# Patient Record
Sex: Male | Born: 1955 | Race: Black or African American | Hispanic: No | Smoking: Current every day smoker
Health system: Southern US, Community
[De-identification: ages and names within clinical notes are randomized; demographics above are authoritative.]

## PROBLEM LIST (undated history)

## (undated) DIAGNOSIS — I639 Cerebral infarction, unspecified: Secondary | ICD-10-CM

## (undated) DIAGNOSIS — I10 Essential (primary) hypertension: Secondary | ICD-10-CM

## (undated) DIAGNOSIS — E785 Hyperlipidemia, unspecified: Secondary | ICD-10-CM

## (undated) DIAGNOSIS — F32A Depression, unspecified: Secondary | ICD-10-CM

## (undated) DIAGNOSIS — I6529 Occlusion and stenosis of unspecified carotid artery: Secondary | ICD-10-CM

## (undated) DIAGNOSIS — F419 Anxiety disorder, unspecified: Secondary | ICD-10-CM

## (undated) HISTORY — PX: ANKLE SURGERY: SHX546

## (undated) HISTORY — DX: Hyperlipidemia, unspecified: E78.5

## (undated) HISTORY — DX: Occlusion and stenosis of unspecified carotid artery: I65.29

## (undated) HISTORY — DX: Essential (primary) hypertension: I10

---

## 2020-01-31 ENCOUNTER — Observation Stay (HOSPITAL_COMMUNITY): Payer: Medicare Other

## 2020-01-31 ENCOUNTER — Other Ambulatory Visit: Payer: Self-pay

## 2020-01-31 ENCOUNTER — Emergency Department (HOSPITAL_COMMUNITY): Payer: Medicare Other

## 2020-01-31 ENCOUNTER — Inpatient Hospital Stay (HOSPITAL_COMMUNITY)
Admission: EM | Admit: 2020-01-31 | Discharge: 2020-02-03 | DRG: 065 | Disposition: A | Discharge status: Skilled Nursing Facility | Payer: Medicare Other | Attending: Family Medicine | Admitting: Family Medicine

## 2020-01-31 DIAGNOSIS — F1010 Alcohol abuse, uncomplicated: Secondary | ICD-10-CM | POA: Diagnosis present

## 2020-01-31 DIAGNOSIS — Z9119 Patient's noncompliance with other medical treatment and regimen: Secondary | ICD-10-CM

## 2020-01-31 DIAGNOSIS — Z9689 Presence of other specified functional implants: Secondary | ICD-10-CM

## 2020-01-31 DIAGNOSIS — F17210 Nicotine dependence, cigarettes, uncomplicated: Secondary | ICD-10-CM | POA: Diagnosis present

## 2020-01-31 DIAGNOSIS — I69351 Hemiplegia and hemiparesis following cerebral infarction affecting right dominant side: Secondary | ICD-10-CM

## 2020-01-31 DIAGNOSIS — I10 Essential (primary) hypertension: Secondary | ICD-10-CM | POA: Diagnosis not present

## 2020-01-31 DIAGNOSIS — E785 Hyperlipidemia, unspecified: Secondary | ICD-10-CM | POA: Diagnosis present

## 2020-01-31 DIAGNOSIS — Z720 Tobacco use: Secondary | ICD-10-CM

## 2020-01-31 DIAGNOSIS — Z7982 Long term (current) use of aspirin: Secondary | ICD-10-CM

## 2020-01-31 DIAGNOSIS — Z79899 Other long term (current) drug therapy: Secondary | ICD-10-CM

## 2020-01-31 DIAGNOSIS — N182 Chronic kidney disease, stage 2 (mild): Secondary | ICD-10-CM | POA: Diagnosis present

## 2020-01-31 DIAGNOSIS — Z20822 Contact with and (suspected) exposure to covid-19: Secondary | ICD-10-CM | POA: Diagnosis present

## 2020-01-31 DIAGNOSIS — I129 Hypertensive chronic kidney disease with stage 1 through stage 4 chronic kidney disease, or unspecified chronic kidney disease: Secondary | ICD-10-CM | POA: Diagnosis present

## 2020-01-31 DIAGNOSIS — I639 Cerebral infarction, unspecified: Secondary | ICD-10-CM | POA: Diagnosis present

## 2020-01-31 DIAGNOSIS — R531 Weakness: Secondary | ICD-10-CM | POA: Diagnosis present

## 2020-01-31 DIAGNOSIS — Z833 Family history of diabetes mellitus: Secondary | ICD-10-CM

## 2020-01-31 DIAGNOSIS — I6523 Occlusion and stenosis of bilateral carotid arteries: Secondary | ICD-10-CM | POA: Diagnosis present

## 2020-01-31 DIAGNOSIS — R7989 Other specified abnormal findings of blood chemistry: Secondary | ICD-10-CM | POA: Diagnosis present

## 2020-01-31 DIAGNOSIS — Z7902 Long term (current) use of antithrombotics/antiplatelets: Secondary | ICD-10-CM

## 2020-01-31 DIAGNOSIS — R001 Bradycardia, unspecified: Secondary | ICD-10-CM | POA: Diagnosis present

## 2020-01-31 DIAGNOSIS — Z823 Family history of stroke: Secondary | ICD-10-CM

## 2020-01-31 DIAGNOSIS — F1290 Cannabis use, unspecified, uncomplicated: Secondary | ICD-10-CM | POA: Diagnosis present

## 2020-01-31 DIAGNOSIS — Z8249 Family history of ischemic heart disease and other diseases of the circulatory system: Secondary | ICD-10-CM

## 2020-01-31 DIAGNOSIS — R4189 Other symptoms and signs involving cognitive functions and awareness: Secondary | ICD-10-CM | POA: Diagnosis present

## 2020-01-31 DIAGNOSIS — I6381 Other cerebral infarction due to occlusion or stenosis of small artery: Principal | ICD-10-CM | POA: Diagnosis present

## 2020-01-31 DIAGNOSIS — I7411 Embolism and thrombosis of thoracic aorta: Secondary | ICD-10-CM | POA: Diagnosis present

## 2020-01-31 DIAGNOSIS — R471 Dysarthria and anarthria: Secondary | ICD-10-CM | POA: Diagnosis present

## 2020-01-31 DIAGNOSIS — R7303 Prediabetes: Secondary | ICD-10-CM | POA: Diagnosis present

## 2020-01-31 LAB — COMPREHENSIVE METABOLIC PANEL
ALT: 26 U/L (ref 0–44)
AST: 19 U/L (ref 15–41)
Albumin: 4.1 g/dL (ref 3.5–5.0)
Alkaline Phosphatase: 61 U/L (ref 38–126)
Anion gap: 13 (ref 5–15)
BUN: 14 mg/dL (ref 8–23)
CO2: 25 mmol/L (ref 22–32)
Calcium: 9.8 mg/dL (ref 8.9–10.3)
Chloride: 102 mmol/L (ref 98–111)
Creatinine, Ser: 1.44 mg/dL — ABNORMAL HIGH (ref 0.61–1.24)
GFR, Estimated: 54 mL/min — ABNORMAL LOW (ref >60)
Glucose, Bld: 97 mg/dL (ref 70–99)
Potassium: 3.7 mmol/L (ref 3.5–5.1)
Sodium: 140 mmol/L (ref 135–145)
Total Bilirubin: 0.7 mg/dL (ref 0.3–1.2)
Total Protein: 8.3 g/dL — ABNORMAL HIGH (ref 6.5–8.1)

## 2020-01-31 LAB — I-STAT CHEM 8, ED
BUN: 20 mg/dL (ref 8–23)
Calcium, Ion: 1.18 mmol/L (ref 1.15–1.40)
Chloride: 103 mmol/L (ref 98–111)
Creatinine, Ser: 1.4 mg/dL — ABNORMAL HIGH (ref 0.61–1.24)
Glucose, Bld: 96 mg/dL (ref 70–99)
HCT: 50 % (ref 39.0–52.0)
Hemoglobin: 17 g/dL (ref 13.0–17.0)
Potassium: 4.1 mmol/L (ref 3.5–5.1)
Sodium: 140 mmol/L (ref 135–145)
TCO2: 27 mmol/L (ref 22–32)

## 2020-01-31 LAB — CBC
HCT: 50.1 % (ref 39.0–52.0)
Hemoglobin: 16.1 g/dL (ref 13.0–17.0)
MCH: 30.6 pg (ref 26.0–34.0)
MCHC: 32.1 g/dL (ref 30.0–36.0)
MCV: 95.2 fL (ref 80.0–100.0)
Platelets: 345 10*3/uL (ref 150–400)
RBC: 5.26 MIL/uL (ref 4.22–5.81)
RDW: 13.5 % (ref 11.5–15.5)
WBC: 9.4 10*3/uL (ref 4.0–10.5)
nRBC: 0 % (ref 0.0–0.2)

## 2020-01-31 LAB — URINALYSIS, ROUTINE W REFLEX MICROSCOPIC
Bacteria, UA: NONE SEEN
Bilirubin Urine: NEGATIVE
Glucose, UA: NEGATIVE mg/dL
Ketones, ur: NEGATIVE mg/dL
Nitrite: NEGATIVE
Protein, ur: NEGATIVE mg/dL
Specific Gravity, Urine: 1.02 (ref 1.005–1.030)
pH: 5 (ref 5.0–8.0)

## 2020-01-31 LAB — RAPID URINE DRUG SCREEN, HOSP PERFORMED
Amphetamines: NOT DETECTED
Barbiturates: NOT DETECTED
Benzodiazepines: NOT DETECTED
Cocaine: NOT DETECTED
Opiates: NOT DETECTED
Tetrahydrocannabinol: POSITIVE — AB

## 2020-01-31 LAB — HIV ANTIBODY (ROUTINE TESTING W REFLEX): HIV Screen 4th Generation wRfx: NONREACTIVE

## 2020-01-31 LAB — DIFFERENTIAL
Abs Immature Granulocytes: 0.03 10*3/uL (ref 0.00–0.07)
Basophils Absolute: 0 10*3/uL (ref 0.0–0.1)
Basophils Relative: 0 %
Eosinophils Absolute: 0.1 10*3/uL (ref 0.0–0.5)
Eosinophils Relative: 2 %
Immature Granulocytes: 0 %
Lymphocytes Relative: 45 %
Lymphs Abs: 4.2 10*3/uL — ABNORMAL HIGH (ref 0.7–4.0)
Monocytes Absolute: 0.7 10*3/uL (ref 0.1–1.0)
Monocytes Relative: 7 %
Neutro Abs: 4.3 10*3/uL (ref 1.7–7.7)
Neutrophils Relative %: 46 %

## 2020-01-31 LAB — RESP PANEL BY RT-PCR (FLU A&B, COVID) ARPGX2
Influenza A by PCR: NEGATIVE
Influenza B by PCR: NEGATIVE
SARS Coronavirus 2 by RT PCR: NEGATIVE

## 2020-01-31 LAB — APTT: aPTT: 33 seconds (ref 24–36)

## 2020-01-31 LAB — CBG MONITORING, ED: Glucose-Capillary: 109 mg/dL — ABNORMAL HIGH (ref 70–99)

## 2020-01-31 LAB — PROTIME-INR
INR: 1.1 (ref 0.8–1.2)
Prothrombin Time: 13.3 seconds (ref 11.4–15.2)

## 2020-01-31 LAB — TROPONIN I (HIGH SENSITIVITY): Troponin I (High Sensitivity): 17 ng/L (ref <18)

## 2020-01-31 LAB — ETHANOL: Alcohol, Ethyl (B): <10 mg/dL (ref <10)

## 2020-01-31 MED ORDER — ASPIRIN 81 MG PO CHEW: 81.0000 mg | CHEWABLE_TABLET | Freq: Every day | ORAL | Status: DC

## 2020-01-31 MED ORDER — IOHEXOL 350 MG/ML SOLN
80.0000 mL | Freq: Once | INTRAVENOUS | Status: AC | PRN
  Administered 2020-01-31: 80 mL via INTRAVENOUS

## 2020-01-31 MED ORDER — ACETAMINOPHEN 160 MG/5ML PO SOLN: 650.0000 mg | ORAL | Status: DC | PRN

## 2020-01-31 MED ORDER — ATORVASTATIN CALCIUM 80 MG PO TABS
80.0000 mg | ORAL_TABLET | Freq: Every day | ORAL | Status: DC
  Administered 2020-01-31 – 2020-02-03 (×4): 80 mg via ORAL
  Filled 2020-01-31: qty 1
  Filled 2020-01-31: qty 8
  Filled 2020-01-31 (×2): qty 1

## 2020-01-31 MED ORDER — ENOXAPARIN SODIUM 40 MG/0.4ML ~~LOC~~ SOLN
40.0000 mg | SUBCUTANEOUS | Status: DC
  Administered 2020-01-31 – 2020-02-02 (×3): 40 mg via SUBCUTANEOUS
  Filled 2020-01-31 (×3): qty 0.4

## 2020-01-31 MED ORDER — ACETAMINOPHEN 650 MG RE SUPP: 650.0000 mg | RECTAL | Status: DC | PRN

## 2020-01-31 MED ORDER — STROKE: EARLY STAGES OF RECOVERY BOOK
Freq: Once | Status: AC
  Filled 2020-01-31 (×2): qty 1

## 2020-01-31 MED ORDER — ASPIRIN 81 MG PO CHEW
324.0000 mg | CHEWABLE_TABLET | Freq: Once | ORAL | Status: AC
  Administered 2020-01-31: 324 mg via ORAL
  Filled 2020-01-31: qty 4

## 2020-01-31 MED ORDER — ACETAMINOPHEN 325 MG PO TABS: 650.0000 mg | ORAL_TABLET | ORAL | Status: DC | PRN

## 2020-01-31 NOTE — ED Notes (Signed)
Reports given to fonya,RN 3W.

## 2020-01-31 NOTE — ED Notes (Signed)
Pt reports not having a drink in 20 years.

## 2020-01-31 NOTE — ED Provider Notes (Signed)
[facility] EMERGENCY DEPARTMENT Provider Note   CSN: [id number] Arrival date & time: 01/31/20  1018     History No chief complaint on file.   Alec Sanchez is a 64 y.o. male.  HPI   This patient is a 64 year old male, he has a reported history of a prior stroke which occurred a couple years ago, he was in an outside hospital system, he does not know if he was started on any new medications at that time.  He is a very difficult historian, did not bring any of his medications with him, the family member who dropped him off took his phone and is unable to be contacted.  The patient states that [facility] [place] and has been to a local family doctor here but does not know the name.  He is unsure if he is anticoagulated.  He is able to tell me that with his prior stroke he had changes in vision and slurred speech which seem to improve.  He notes that 3 days ago the slurred speech returned, it has worsened over the last few days.  He also notes having some difficulty with his right arm and right leg compared to normal.  He walks with a cane because of prior left leg weakness.  He denies any head injury, denies a headache, denies nausea or vomiting, denies coughing or shortness of breath, symptoms of been persistent and gradually worsening.  No past medical history on file.  There are no problems to display for this patient.   Hypertension Ischemic Stroke    No family history on file.  Social History   Tobacco Use  . Smoking status: Not on file  Substance Use Topics  . Alcohol use: Not on file  . Drug use: Not on file    Home Medications Prior to Admission medications   Not on File    Allergies    Patient has no allergy information on record.  Review of Systems   Review of Systems  All other systems reviewed and are negative.   Physical Exam Updated Vital Signs BP (!) 187/91   Pulse (!) 59   Temp 98.2 F  (36.8 C) (Oral)   Resp (!) 22   SpO2 100%   Physical Exam Vitals and nursing note reviewed.  Constitutional:      General: He is not in acute distress.    Appearance: He is well-developed.  HENT:     Head: Normocephalic and atraumatic.     Mouth/Throat:     Pharynx: No oropharyngeal exudate.  Eyes:     General: No scleral icterus.       Right eye: No discharge.        Left eye: No discharge.     Conjunctiva/sclera: Conjunctivae normal.     Pupils: Pupils are equal, round, and reactive to light.  Neck:     Thyroid: No thyromegaly.     Vascular: No JVD.  Cardiovascular:     Rate and Rhythm: Normal rate and regular rhythm.     Heart sounds: Normal heart sounds. No murmur heard.  No friction rub. No gallop.   Pulmonary:     Effort: Pulmonary effort is normal. No respiratory distress.     Breath sounds: Normal breath sounds. No wheezing or rales.  Abdominal:     General: Bowel sounds are normal. There is no distension.     Palpations: Abdomen is soft. There is no mass.  Tenderness: There is no abdominal tenderness.  Musculoskeletal:        General: No tenderness. Normal range of motion.     Cervical back: Normal range of motion and neck supple.  Lymphadenopathy:     Cervical: No cervical adenopathy.  Skin:    General: Skin is warm and dry.     Findings: No erythema or rash.  Neurological:     Mental Status: He is alert.     Coordination: Coordination normal.     Comments: The patient is slightly slow doing finger-nose-finger with his right hand, normal on the left.  He has a 4-1/2 out of 5 strength in the right upper extremity compared to the left, he has equal strength in the bilateral lower extremities, diffusely normal sensation, cranial nerves III through XII are normal, he does have a slowed slurred speech but is able to use the right words.  There is no changes in his gross visual acuity or his peripheral visual fields.  Psychiatric:        Behavior: Behavior  normal.     ED Results / Procedures / Treatments   Labs (all labs ordered are listed, but only abnormal results are displayed) Labs Reviewed  DIFFERENTIAL - Abnormal; Notable for the following components:      Result Value   Lymphs Abs 4.2 (*)    All other components within normal limits  COMPREHENSIVE METABOLIC PANEL - Abnormal; Notable for the following components:   Creatinine, Ser 1.44 (*)    Total Protein 8.3 (*)    GFR, Estimated 54 (*)    All other components within normal limits  RAPID URINE DRUG SCREEN, HOSP PERFORMED - Abnormal; Notable for the following components:   Tetrahydrocannabinol POSITIVE (*)    All other components within normal limits  URINALYSIS, ROUTINE W REFLEX MICROSCOPIC - Abnormal; Notable for the following components:   Hgb urine dipstick SMALL (*)    Leukocytes,Ua SMALL (*)    All other components within normal limits  I-STAT CHEM 8, ED - Abnormal; Notable for the following components:   Creatinine, Ser 1.40 (*)    All other components within normal limits  CBG MONITORING, ED - Abnormal; Notable for the following components:   Glucose-Capillary 109 (*)    All other components within normal limits  PROTIME-INR  APTT  CBC  ETHANOL    EKG EKG Interpretation  Date/Time:  Monday January 31 2020 10:31:37 EST Ventricular Rate:  57 PR Interval:  138 QRS Duration: 84 QT Interval:  434 QTC Calculation: 422 R Axis:   54 Text Interpretation: Sinus bradycardia Septal infarct , age undetermined Abnormal ECG No old tracing to compare Confirmed by Eber Hong ([id number]) on 01/31/2020 11:44:08 AM   Radiology CT HEAD WO CONTRAST  Addendum Date: 01/31/2020   ADDENDUM REPORT: 01/31/2020 11:25 ADDENDUM: These results were called by telephone at the time of interpretation on 01/31/2020 at 11:24 am to provider Dr. Hyacinth Meeker, Who verbally acknowledged these results. Electronically Signed   By: Donzetta Kohut M.D.   On: 01/31/2020 11:25   Result Date:  01/31/2020 CLINICAL DATA:  Difficulty walking with slurred speech for the past week. EXAM: CT HEAD WITHOUT CONTRAST TECHNIQUE: Contiguous axial images were obtained from the base of the skull through the vertex without intravenous contrast. COMPARISON:  None. FINDINGS: Brain: No evidence of, hemorrhage, hydrocephalus, extra-axial collection or mass lesion/mass effect. Signs of atrophy and chronic microvascular ischemic change. There is however an area of hypodensity in the LEFT corona radiata.  This area in the absence of priors is indeterminate. Vascular: No hyperdense vessel or unexpected calcification. Skull: Normal. Negative for fracture or focal lesion. Sinuses/Orbits: Visualized paranasal sinuses and orbits are normal. Other: Trace stranding in the occipital soft tissues, correlate with any history of trauma in this area. No hematoma. IMPRESSION: 1. Signs of atrophy and chronic microvascular ischemic change. There is however an area of hypodensity in the LEFT corona radiata out of which is indeterminate in the absence of priors. Acute or subacute infarct could potentially have this appearance. If there is high clinical concern MRI may be helpful for further assessment. 2. Trace stranding in the occipital soft tissues, correlate with any history of trauma in this area. No hematoma. Call is out to the referring provider to further discuss findings in the above case. Electronically Signed: By: Donzetta Kohut M.D. On: 01/31/2020 11:09    Procedures Procedures (including critical care time)  Medications Ordered in ED Medications  aspirin chewable tablet 324 mg (324 mg Oral Given 01/31/20 1204)    ED Course  I have reviewed the triage vital signs and the nursing notes.  Pertinent labs & imaging results that were available during my care of the patient were reviewed by me and considered in my medical decision making (see chart for details).  Clinical Course as of Jan 30 1422  Mon Jan 31, 2020  1307 I  discussed the case with Dr. Amada Jupiter of the hospitalist service who agrees that the patient needs to be admitted for a stroke work-up and he will see the patient in consultation   [BM]    Clinical Course User Index [BM] Eber Hong, MD   MDM Rules/Calculators/A&P                          The patient's metabolic panel shows a slightly elevated creatinine at 1.4, CBC is normal, CT scan of the head shows that there is likely a sign of some increased hypodensity in the left corona radiata, there is no old strokes to compare this to however given the patient's new slurred speech she will likely need to be admitted for stroke work-up.  I will consult with the neuro hospitalist.  I personally viewed the CT scan and I agree with the finding of hypodensity.  I have personally reviewed and interpreted the laboratory work-up and I see no other signs of causation for the patient have slurred speech.  Again he is unaware of the medications that he takes.  Given no bleeding on the CT scan I will start aspirin.  The CT scan shows an evolving infarct, lab work is rather unremarkable, creatinine is 1.4 and the drug screen was positive for marijuana.  I discussed the care with the family medicine resident who will admit the patient to the hospital as he is unassigned. Vitals:   01/31/20 1230 01/31/20 1245 01/31/20 1300 01/31/20 1315  BP: (!) 159/79 (!) 168/99 (!) 120/91 (!) 187/91  Pulse: (!) 54 (!) 54 (!) 51 (!) 59  Resp: 11 15 12  (!) 22  Temp:      TempSrc:      SpO2: 100% 98% 100% 100%     Final Clinical Impression(s) / ED Diagnoses Final diagnoses:  Acute ischemic stroke (HCC)  Primary hypertension    Rx / DC Orders ED Discharge Orders    None       , MD 01/31/20 1423

## 2020-01-31 NOTE — Progress Notes (Signed)
Pt admitted to the unit from ED. Pt A&O x4, VSS, telemetry applied and verified with CCMD, NT called to second verify. Fall/safety precaution and prevention education completed. Pt skin intact with no pressure ulcer or opened wounds noted per protocol. Pt in bed with call light within reach and bed alarm on. Will continue to closely monitor. Dionne Bucy RN  01/31/20 1941  Vitals  Temp 98.2 F (36.8 C)  Temp Source Oral  BP (!) 183/90  MAP (mmHg) 117  BP Location Right Arm  BP Method Automatic  Patient Position (if appropriate) Sitting  Pulse Rate (!) 57  Pulse Rate Source Monitor  Resp 16  MEWS COLOR  MEWS Score Color Green  Oxygen Therapy  SpO2 100 %  O2 Device Room Air  Pain Assessment  Pain Scale 0-10  Pain Score 0  Height and Weight  Height 5\' 7"  (1.702 m)  Weight 94.3 kg  BSA (Calculated - sq m) 2.11 sq meters  BMI (Calculated) 32.55  Weight in (lb) to have BMI = 25 159.3  MEWS Score  MEWS Temp 0  MEWS Systolic 0  MEWS Pulse 0  MEWS RR 0  MEWS LOC 0  MEWS Score 0

## 2020-01-31 NOTE — H&P (Addendum)
Family Medicine Teaching [facility] Admission History and Physical Service Pager: [phone]  Patient name: Alec Sanchez Medical record number: [id number] Date of birth: June 16, 1955 Age: 64 y.o. Gender: male  Primary Care Provider: [facility] Consultants: Neuro Code Status: DNI Preferred Emergency Contact: Hendrix Yurkovich, Daughter, [phone]  Chief Complaint: slurred speech and right arm weakness  Assessment and Plan: Alec Sanchez is a 64 y.o. male presenting with progressively worsening  Slurred speech and right sided weakness. PMH is significant for stroke with residual right sided weakness, HTN, HLD.   Ischemic stroke with right sided weakness Patient presented to the ED with 3 days of progressively worsening slurred speech and right sided weakness. Patient reports history of stroke with residual right sided weakness in March 2020, chronically uses a cane. Patient home medications include ASA 325mg  and Plavix 25mg  daily which the patient reports he has not been compliant with in the last 2-3 weeks. Unsure why patient would be on continued DAPT but likely due to non-compliance and lost to follow-up after prior stroke. CT showed signs of atrophy and chronic vascular ischemic change with an area of hypodensity in the left corona radiata out of which is indeterminate; acute or subacute infarct to potentially have this appearance-recommended MRI for further assessment. In the ED, patient was given ASA 324 and neurology was consulted. Physical exam significant for some slurring of speech and deviation of tongue to the right, weakness was not apparent on exam. Patient unable to have MRI at the moment due to spinal cord stimulator, we are unsure if it is MRI compatible, will await neuro recommendations for further imaging studies.   He will be admitted for further acute stroke w/u. - Admit to inpatient FPTS as observation, attending Dr. - Neurology consulted, appreciate recs -  Permissive HTN - Continuous cardiac monitoring - F/u echocardiogram - Patient will need CTA head and neck, will await neurology recs to order - Patient has spinal cord stimulator, defer MRI until able to confirm if MRI compatible - Currently holding home Plavix and ASA, awaiting neuro recs, s/p ASA 324mg  in ED - start high intensity statin, lipitor 80mg  (patient's home med, although noncompliant) - F/u A1C, lipid panel for risk stratification - PT/OT/SLP  Abnormal ECG Patient noted to have bradycardia with minimal ST elevations in anterior leads. Less likely ACS, but in setting of recent stroke, ruling out cardiac concerns with Troponin trend - F/u Troponins  Elevated creatinine: ?CKD vs AKI Patient's creatinine elevated to 1.44 on admission.  We are unsure of the patient's baseline, could possibly be an AKI but unable to determine at this time.  Patient has chronic hypertension and is noncompliant with medications. Possibly related to recent ischemic stroke but could be chronic with hypertension that does not appear to be well controlled. - Continue to monitor with BMPs   Hypertension Patient admitted with blood pressures ranging from 120-187/77-107. Home medications include lisinopril 10 mg daily, HCTZ 25 mg daily, Norvasc 10 daily.  Patient has been non-compliant with home medications in the last 2-3 weeks, suspect that he has been noncompliant for longer than that given number of pills in bottles at bedside and fill dates. Patient has an RX for Prazosin as well at bedside, unsure if HTN is the indication as patient is also unsure of medical history. Patient reports that his systolic pressures have been ranging from 120s-140s when he was taking his medications, family member in room thinks that the pressures have been higher than reported. Patient currently  denies HA. - Permissive HTN due to stroke - Continue to monitor - Holding home medications  Bradycardia Patient noted to have heart  rates in the 50s. Patient currently reporting some mild dizziness but otherwise asymptomatic and this likely is chronic in nature.  EKG sinus bradycardia, no AV block noted. - Continue to monitor - Continuous cardiac monitoring - Echo per above  Marijuana use UDS on admission positive for marijuana. During solo interview, patient reported that he has "not had marijuana since the summer". Social history screening is likely skewed due to patient under reporting.  Denies other drug use. 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 Patient denies alcohol use since 2007 but has had several inconsistencies when taking his history, CIWAs due to concern for alcohol use.  - Etoh level - CIWAs q6h, can consider discontinuing if not showing signs of withdrawal. - will add prn ativan if consistently elevated CIWAs  Tobacco Abuse Smokes 2-3 cigs a day.  Started smoking in June again, previously had quit around age 72.  Started smoking when he was 13. - Consider need for nicotine patch as unsure if patient was honest about his tobacco use. - encourage cessation  Concern for cognitive impairment Upon history taking and exam, patient appears to have some mild impairment with his memory. Patient did not mention his history of spinal surgery, could not remember his medications or several of his medical diagnoses. Will likely need outpatient follow-up for monitoring and evaluation.  - consider MOCA as outpatient, would not be as useful inpatient in setting of likely acute stroke   FEN/GI: Heart healthy diet Prophylaxis: Lovenox  Disposition: Admit to med-tele obs  History of Present Illness:  Alec Sanchez is a 64 y.o. male presenting with slurred speech and right sided weakness for the last 3 days that has progressively worsened.  Patient was brought to the ED and this morning by her son-in-law, who was concerned because the  patient was "walking funny and I was slowing my weight".  Patient states for the last 3 days he noticed that his right leg and arm were weaker but did not noticed that his speech was much change.  He does note that he has had difficulty with formulating his words and expressing them, but states that he is able to understand other people just fine.  Reports no changes in vision or difficulty with swallowing. Patient reported that he had a stroke in March 2020 treated at [facility] and that he has residual right leg weakness.  Patient does ambulate with a cane for the last 12 years after having broken his ankle when he was in the [employer] and had a subsequent surgery.  Patient states that he lives alone in an apartment in [place].  He has family in the area that he sees very frequently.   PMH: Patient denies history of cardiac problems including heart failure and heart attack.  Denies lung disease, denies kidney disease.   Patient has several medications that he takes, but he is unsure of what they are.  He reports not having taken his medications in the last 2 to 3 weeks because he is "hard headed". Patient unsure of why he is on certain medications, medical history limited due to this. Patient reports he has never been hospitalized other than his last    Medications (brought in by son-in-law) -Atorvastatin 80 mg -Plavix 100 mg -Aspirin 325 mg -Lisinopril  10 mg daily -HCTZ 25 mg daily -Norvasc 10 mg daily -Prazosin 1 mg daily -Ferrous sulfate 325 daily -Paroxetine 40 mg daily -Naproxen 5 mg as needed -Sildenafil 100 mg as needed -Cholecalciferol 25 mcg daily   Review Of Systems: Per HPI with the following additions:   Review of Systems  Constitutional: Negative for fever.  HENT: Negative for congestion, sore throat and trouble swallowing.   Eyes: Negative for visual disturbance.  Respiratory: Negative for cough and shortness of breath.   Cardiovascular: Negative  for chest pain and palpitations.  Gastrointestinal: Negative for abdominal pain, diarrhea, nausea and vomiting.  Genitourinary: Negative for difficulty urinating and dysuria.  Skin: Negative for rash.  Neurological: Positive for dizziness, speech difficulty, weakness and numbness (right side). Negative for syncope and headaches.     Patient Active Problem List   Diagnosis Date Noted  . 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  Family History: No family history on file.  Father had DM, HTN, stroke, unknown cause of death, was a heavy smoker Cousin with ESRD on HD Mom died from Heart Failure, hx stroke  Allergies and Medications: No Known Allergies No current facility-administered medications on file prior to encounter.   Current Outpatient Medications on File Prior to Encounter  Medication Sig Dispense Refill  . amLODipine (NORVASC) 10 MG tablet Take 10 mg by mouth daily.    Marland Kitchen. aspirin 325 MG EC tablet Take 325 mg by mouth daily.    Marland Kitchen. atorvastatin (LIPITOR) 80 MG tablet Take 80 mg by mouth daily.    . Cholecalciferol 25 MCG (1000 UT) tablet Take 1,000 Units by mouth daily.    . clopidogrel (PLAVIX) 75 MG tablet Take 75 mg by mouth daily.    . ferrous sulfate 325 (65 FE) MG tablet Take 325 mg by mouth daily with breakfast.    . hydrochlorothiazide (HYDRODIURIL) 25 MG tablet Take 25 mg by mouth daily.    Marland Kitchen. lisinopril (ZESTRIL) 10 MG tablet  Take 10 mg by mouth daily.    . naproxen (NAPROSYN) 500 MG tablet Take 500 mg by mouth daily as needed for mild pain.    Marland Kitchen. PARoxetine (PAXIL) 40 MG tablet Take 40 mg by mouth every morning.    . prazosin (MINIPRESS) 1 MG capsule Take 1 mg by mouth at bedtime.    . sildenafil (VIAGRA) 100 MG tablet Take 100 mg by mouth daily as needed for erectile dysfunction.     NKA  Objective: BP (!) 167/111   Pulse 64   Temp 98.2 F (36.8 C) (Oral)   Resp 13   SpO2 99%  Exam: General --  pleasant and cooperative. HEENT -- Head is normocephalic. PERRLA. EOMI. Ears, nose and throat were benign. Integument --intact, notable dry and flaking skin on legs and feet Chest --normal WOB, lungs CTAB Cardiac --bradycardia, sinus rhythm, no M/G appreciated Abdomen -- soft, nontender. No masses palpable. Bowel sounds present. CNS --CN II through  XII grossly intact, tongue deviation noted to the right and able to bring to midline, 2+ reflexes bilaterally, slight slurring of speech.  Finger to nose slow on right compared to left, but intact. Extremeties - 5/5 strength in bilateral upper extremities and lower extremities.  Dorsalis pedis pulses present and symmetric, feet cold. Psych: mood and affect appropriate for circumstance, somewhat tangential thought process   Labs and Imaging: CBC BMET  Recent Labs  Lab 01/31/20 1031 01/31/20 1031 01/31/20 1047  WBC 9.4  --   --   HGB 16.1   < > 17.0  HCT 50.1   < > 50.0  PLT 345  --   --    < > = values in this interval not displayed.   Recent Labs  Lab 01/31/20 1031 01/31/20 1031 01/31/20 1047  NA 140   < > 140  K 3.7   < > 4.1  CL 102   < > 103  CO2 25  --   --   BUN 14   < > 20  CREATININE 1.44*   < > 1.40*  GLUCOSE 97   < > 96  CALCIUM 9.8  --   --    < > = values in this interval not displayed.     EKG: sinus bradycardia, mild ST elevations in anterior leads V1-V6.  CT head without contrast IMPRESSION: 1. Signs of atrophy and chronic  microvascular ischemic change. There is however an area of hypodensity in the LEFT corona radiata out of which is indeterminate in the absence of priors. Acute or subacute infarct could potentially have this appearance. If there is high clinical concern MRI may be helpful for further assessment. 2. Trace stranding in the occipital soft tissues, correlate with any history of trauma in this area. No hematoma.  Evelena Leyden, DO 01/31/2020, 4:58 PM PGY-1, [facility] FPTS Intern pager: [phone], text pages welcome  FPTS Upper-Level Resident Addendum   I have independently interviewed and examined the patient. I have discussed the above with the original author and agree with their documentation. My edits for correction/addition/clarification are in green. Please see also any attending notes.   Luis Abed, D.O. PGY-3, [facility] 01/31/2020 5:52 PM  FPTS Service pager: [phone] (text pages welcome through [facility])

## 2020-01-31 NOTE — ED Triage Notes (Signed)
Pt here from home with c/o slurred speech ,has been ongoing for 1 week , pt had a stroke back in feb of this year,

## 2020-01-31 NOTE — Consult Note (Signed)
Neurology Consultation Reason for Consult: Slurred speech Referring Physician: Hyacinth Meeker, B  CC: Slurred speech  History is obtained from: Patient  HPI: Alec Sanchez is a 64 y.o. male with a history of previous stroke, tobacco abuse who presents with slurred speech for the past 3 days.  He states that his son made him come in, mainly because of the slurred speech.  He does not notice any new symptoms other than his slurring.  He denies any new focal weakness, but does report that he has a history of right leg weakness from a stroke that happened previously.   LKW: 3 days ago tpa given?: no, outside of window    ROS: A 14 point ROS was performed and is negative except as noted in the HPI.  PMHx: Hypercholesterolemia Hypertension  FHx: Father had stroke  Social History: Current smoker  Exam: Current vital signs: BP (!) 169/106   Pulse 63   Temp 98.2 F (36.8 C) (Oral)   Resp (!) 22   SpO2 99%  Vital signs in last 24 hours: Temp:  [98.2 F (36.8 C)] 98.2 F (36.8 C) (12/06 1135) Pulse Rate:  [51-70] 63 (12/06 1830) Resp:  [9-23] 22 (12/06 1830) BP: (120-187)/(77-111) 169/106 (12/06 1830) SpO2:  [96 %-100 %] 99 % (12/06 1830)   Physical Exam  Constitutional: Appears well-developed and well-nourished.  Psych: Affect appropriate to situation Eyes: No scleral injection HENT: No OP obstrucion MSK: no joint deformities.  Cardiovascular: Normal rate and regular rhythm.  Respiratory: Effort normal, non-labored breathing GI: Soft.  No distension. There is no tenderness.  Skin: WDI  Neuro: Mental Status: Patient is awake, alert, oriented to person, place, month, year. Patient is able to give a clear and coherent history. No signs of aphasia or neglect Cranial Nerves: II: Visual Fields are full. Pupils are equal, round, and reactive to light.   III,IV, VI: EOMI without ptosis or diploplia.  V: Facial sensation is symmetric to temperature VII: Facial movement with mild  flattening of the left nasolabial fold VIII: hearing is intact to voice X: Uvula elevates symmetrically XI: Shoulder shrug is symmetric. XII: tongue deviates to the right Motor: He has 4/5 weakness of the right leg, and he does not give good effort in his left arm due to IV in the [facility]. Sensory: Sensation is symmetric to light touch and temperature in the arms and legs. Cerebellar: Slower on the right than left without past-pointing.  I have reviewed labs in epic and the results pertinent to this consultation are: Creatinine 1.4 UDS positive for THC  I have reviewed the images obtained: CT head-multiple old appearing insults on head CT  Impression: 64 year old male with increasing slurred speech and unsteady gait in the setting of previous strokes.  My concern is that he might have had a new acute ischemic insult and therefore needs further work-up.  Recrudescence is also a possibility, though I think less likely.  Recommendations: - HgbA1c, fasting lipid panel - MRI if possible - Frequent neuro checks - Echocardiogram - CTA head and neck - Prophylactic therapy-Antiplatelet med: Aspirin +plavix(home meds) - Risk factor modification - Telemetry monitoring - PT consult, OT consult, Speech consult - Stroke team to follow    Ritta Slot, MD [facility] [phone]  If 7pm- 7am, please page neurology on call as listed in AMION.

## 2020-01-31 NOTE — Progress Notes (Signed)
Telemetry called to report pt having 2.28sec pause. MD was paged and returned call back now for notification. No new orders received and pt asymptomatic in bed sleeping. Will continue to closely monitor. Dionne Bucy RN

## 2020-01-31 NOTE — ED Notes (Signed)
Dinner Trays Ordered @ 1705. 

## 2020-02-01 ENCOUNTER — Observation Stay (HOSPITAL_COMMUNITY): Payer: Medicare Other

## 2020-02-01 ENCOUNTER — Observation Stay (HOSPITAL_BASED_OUTPATIENT_CLINIC_OR_DEPARTMENT_OTHER): Payer: Medicare Other

## 2020-02-01 DIAGNOSIS — R7303 Prediabetes: Secondary | ICD-10-CM | POA: Diagnosis present

## 2020-02-01 DIAGNOSIS — I10 Essential (primary) hypertension: Secondary | ICD-10-CM | POA: Diagnosis not present

## 2020-02-01 DIAGNOSIS — Z823 Family history of stroke: Secondary | ICD-10-CM | POA: Diagnosis not present

## 2020-02-01 DIAGNOSIS — N182 Chronic kidney disease, stage 2 (mild): Secondary | ICD-10-CM | POA: Diagnosis present

## 2020-02-01 DIAGNOSIS — Z720 Tobacco use: Secondary | ICD-10-CM | POA: Diagnosis not present

## 2020-02-01 DIAGNOSIS — I7411 Embolism and thrombosis of thoracic aorta: Secondary | ICD-10-CM | POA: Diagnosis present

## 2020-02-01 DIAGNOSIS — R7989 Other specified abnormal findings of blood chemistry: Secondary | ICD-10-CM | POA: Diagnosis present

## 2020-02-01 DIAGNOSIS — F1010 Alcohol abuse, uncomplicated: Secondary | ICD-10-CM | POA: Diagnosis present

## 2020-02-01 DIAGNOSIS — E785 Hyperlipidemia, unspecified: Secondary | ICD-10-CM | POA: Diagnosis present

## 2020-02-01 DIAGNOSIS — I6521 Occlusion and stenosis of right carotid artery: Secondary | ICD-10-CM | POA: Diagnosis not present

## 2020-02-01 DIAGNOSIS — R4189 Other symptoms and signs involving cognitive functions and awareness: Secondary | ICD-10-CM | POA: Diagnosis present

## 2020-02-01 DIAGNOSIS — I639 Cerebral infarction, unspecified: Secondary | ICD-10-CM | POA: Diagnosis present

## 2020-02-01 DIAGNOSIS — Z8249 Family history of ischemic heart disease and other diseases of the circulatory system: Secondary | ICD-10-CM | POA: Diagnosis not present

## 2020-02-01 DIAGNOSIS — R531 Weakness: Secondary | ICD-10-CM | POA: Diagnosis present

## 2020-02-01 DIAGNOSIS — R471 Dysarthria and anarthria: Secondary | ICD-10-CM | POA: Diagnosis present

## 2020-02-01 DIAGNOSIS — F17210 Nicotine dependence, cigarettes, uncomplicated: Secondary | ICD-10-CM | POA: Diagnosis present

## 2020-02-01 DIAGNOSIS — I6389 Other cerebral infarction: Secondary | ICD-10-CM | POA: Diagnosis not present

## 2020-02-01 DIAGNOSIS — I6523 Occlusion and stenosis of bilateral carotid arteries: Secondary | ICD-10-CM | POA: Diagnosis present

## 2020-02-01 DIAGNOSIS — Z20822 Contact with and (suspected) exposure to covid-19: Secondary | ICD-10-CM | POA: Diagnosis present

## 2020-02-01 DIAGNOSIS — Z9119 Patient's noncompliance with other medical treatment and regimen: Secondary | ICD-10-CM | POA: Diagnosis not present

## 2020-02-01 DIAGNOSIS — I129 Hypertensive chronic kidney disease with stage 1 through stage 4 chronic kidney disease, or unspecified chronic kidney disease: Secondary | ICD-10-CM | POA: Diagnosis present

## 2020-02-01 DIAGNOSIS — Z79899 Other long term (current) drug therapy: Secondary | ICD-10-CM | POA: Diagnosis not present

## 2020-02-01 DIAGNOSIS — R001 Bradycardia, unspecified: Secondary | ICD-10-CM | POA: Diagnosis present

## 2020-02-01 DIAGNOSIS — I6381 Other cerebral infarction due to occlusion or stenosis of small artery: Secondary | ICD-10-CM | POA: Diagnosis present

## 2020-02-01 DIAGNOSIS — I69351 Hemiplegia and hemiparesis following cerebral infarction affecting right dominant side: Secondary | ICD-10-CM | POA: Diagnosis not present

## 2020-02-01 DIAGNOSIS — F1290 Cannabis use, unspecified, uncomplicated: Secondary | ICD-10-CM | POA: Diagnosis present

## 2020-02-01 DIAGNOSIS — Z7902 Long term (current) use of antithrombotics/antiplatelets: Secondary | ICD-10-CM | POA: Diagnosis not present

## 2020-02-01 DIAGNOSIS — Z7982 Long term (current) use of aspirin: Secondary | ICD-10-CM | POA: Diagnosis not present

## 2020-02-01 DIAGNOSIS — Z833 Family history of diabetes mellitus: Secondary | ICD-10-CM | POA: Diagnosis not present

## 2020-02-01 LAB — BASIC METABOLIC PANEL
Anion gap: 13 (ref 5–15)
BUN: 15 mg/dL (ref 8–23)
CO2: 25 mmol/L (ref 22–32)
Calcium: 9.7 mg/dL (ref 8.9–10.3)
Chloride: 100 mmol/L (ref 98–111)
Creatinine, Ser: 1.46 mg/dL — ABNORMAL HIGH (ref 0.61–1.24)
GFR, Estimated: 53 mL/min — ABNORMAL LOW (ref >60)
Glucose, Bld: 80 mg/dL (ref 70–99)
Potassium: 3.5 mmol/L (ref 3.5–5.1)
Sodium: 138 mmol/L (ref 135–145)

## 2020-02-01 LAB — RPR: RPR Ser Ql: NONREACTIVE

## 2020-02-01 LAB — ECHOCARDIOGRAM COMPLETE
Area-P 1/2: 2.2 cm2
Calc EF: 49.6 %
Height: 67 in
S' Lateral: 2.4 cm
Single Plane A2C EF: 48.8 %
Single Plane A4C EF: 47.8 %
Weight: 3326.3 oz

## 2020-02-01 LAB — LIPID PANEL
Cholesterol: 203 mg/dL — ABNORMAL HIGH (ref 0–200)
HDL: 30 mg/dL — ABNORMAL LOW (ref >40)
LDL Cholesterol: 145 mg/dL — ABNORMAL HIGH (ref 0–99)
Total CHOL/HDL Ratio: 6.8 RATIO
Triglycerides: 138 mg/dL (ref <150)
VLDL: 28 mg/dL (ref 0–40)

## 2020-02-01 LAB — VITAMIN B12: Vitamin B-12: 213 pg/mL (ref 180–914)

## 2020-02-01 LAB — TSH: TSH: 2.33 u[IU]/mL (ref 0.350–4.500)

## 2020-02-01 LAB — HEMOGLOBIN A1C
Hgb A1c MFr Bld: 5.7 % — ABNORMAL HIGH (ref 4.8–5.6)
Mean Plasma Glucose: 116.89 mg/dL

## 2020-02-01 MED ORDER — CLOPIDOGREL BISULFATE 75 MG PO TABS
75.0000 mg | ORAL_TABLET | Freq: Every day | ORAL | Status: DC
  Administered 2020-02-01 – 2020-02-03 (×3): 75 mg via ORAL
  Filled 2020-02-01 (×3): qty 1

## 2020-02-01 MED ORDER — ASPIRIN EC 325 MG PO TBEC
325.0000 mg | DELAYED_RELEASE_TABLET | Freq: Every day | ORAL | Status: DC
  Administered 2020-02-01 – 2020-02-03 (×3): 325 mg via ORAL
  Filled 2020-02-01 (×3): qty 1

## 2020-02-01 MED ORDER — ATORVASTATIN CALCIUM 80 MG PO TABS: 80.0000 mg | ORAL_TABLET | Freq: Every day | ORAL | Status: DC

## 2020-02-01 NOTE — Progress Notes (Signed)
Carotid artery duplex completed Refer to "CV Proc" under chart review to view preliminary results.  02/01/2020 10:06 AM Eula Fried., MHA, RVT, RDCS, RDMS

## 2020-02-01 NOTE — Evaluation (Signed)
Physical Therapy Evaluation Patient Details Name: Alec Sanchez MRN: [id number] DOB: [birth year] Today's Date: 02/01/2020   History of Present Illness  Alec Sanchez is a 64 y.o. male presenting with progressively worsening  Slurred speech and right sided weakness. PMH is significant for stroke with residual right sided weakness, HTN, HLD. UDS + for marijuana. h/o ETOH abuse.   Clinical Impression   Pt with noted R sided weakness however did have R sided weakness PTA from previous stroke. Pt with word finding difficulty, slurred speech and slowed processing but A&Ox4. Per patient he was living alone and using SPC primarily to aide in ambulation. Pt now requiring use of RW and demos increased falls risk. Per patient he can stay with his dtr and son-in-law who can provided 24/7. If not pt will need ST-SNF to achieve safe mod I level of function for safe transition home alone.    Follow Up Recommendations Home health PT;Supervision/Assistance - 24 hour    Equipment Recommendations  Rolling walker with 5" wheels    Recommendations for Other Services       Precautions / Restrictions Precautions Precautions: Fall Precaution Comments: Residual R hemi from previous stroke Restrictions Weight Bearing Restrictions: No      Mobility  Bed Mobility Overal bed mobility: Needs Assistance Bed Mobility: Supine to Sit     Supine to sit: Supervision     General bed mobility comments: increased time, pulled up on bed rail, able to scoot to EOB without physical assist    Transfers Overall transfer level: Needs assistance Equipment used: Rolling walker (2 wheeled) Transfers: Sit to/from Stand Sit to Stand: Min assist         General transfer comment: verbal cues for safe hand placement, minA to power up and steady during transition of hands  Ambulation/Gait Ambulation/Gait assistance: Min assist Gait Distance (Feet): 160 Feet Assistive device: Rolling walker (2 wheeled) Gait  Pattern/deviations: Step-through pattern;Decreased stride length;Drifts right/left Gait velocity: decresaed Gait velocity interpretation: <1.31 ft/sec, indicative of household ambulator General Gait Details: pt running into things on the R, able to self correct but required increased time, max verbal cues to stay in walker especially during turning, pt stradeling L leg of walker during turns despite verbal and tactile cues. verbal cues to stay in walker, pt amb 10' without RW and L HHA, pt remains to have R antalgia and instability  Stairs            Wheelchair Mobility    Modified Rankin (Stroke Patients Only) Modified Rankin (Stroke Patients Only) Pre-Morbid Rankin Score: Slight disability Modified Rankin: Moderately severe disability     Balance Overall balance assessment: Needs assistance Sitting-balance support: Single extremity supported Sitting balance-Leahy Scale: Fair     Standing balance support: Single extremity supported Standing balance-Leahy Scale: Fair Standing balance comment: pt requiring assist for safe ambulation, use of RW or HHA                             Pertinent Vitals/Pain Pain Assessment: No/denies pain    Home Living Family/patient expects to be discharged to:: Private residence Living Arrangements: Alone Available Help at Discharge: Family;Available 24 hours/day (per pt, he can live with dtr and son-in-law) Type of Home: Apartment Home Access: Elevator     Home Layout: One level Home Equipment: Cane - single point;Grab bars - tub/shower;Shower seat - built in      Prior Function Level of Independence: Independent with assistive device(s)  Comments: pt reports using a cane outside home and furniture walks in home, reports he drives and does his grocery shopping     Hand Dominance   Dominant Hand: Right    Extremity/Trunk Assessment   Upper Extremity Assessment Upper Extremity Assessment: RUE  deficits/detail RUE Deficits / Details: grossly 4-/5, residual weakness from previous stroke    Lower Extremity Assessment Lower Extremity Assessment: RLE deficits/detail RLE Deficits / Details: grossly 4-/5, noted instability with amb    Cervical / Trunk Assessment Cervical / Trunk Assessment: Normal  Communication   Communication: Expressive difficulties (slurred speech, word finding difficulties)  Cognition Arousal/Alertness: Awake/alert Behavior During Therapy: Flat affect Overall Cognitive Status: No family/caregiver present to determine baseline cognitive functioning                                 General Comments: pt with delayed processing and decreased insight to safety however suspect this to be near baseline      General Comments General comments (skin integrity, edema, etc.): vss    Exercises     Assessment/Plan    PT Assessment Patient needs continued PT services  PT Problem List Decreased strength;Decreased range of motion;Decreased balance;Decreased mobility;Decreased coordination;Decreased knowledge of use of DME       PT Treatment Interventions DME instruction;Gait training;Stair training;Functional mobility training;Therapeutic activities;Therapeutic exercise;Balance training;Neuromuscular re-education    PT Goals (Current goals can be found in the Care Plan section)  Acute Rehab PT Goals Patient Stated Goal: home PT Goal Formulation: With patient Time For Goal Achievement: 02/15/20 Potential to Achieve Goals: Good Additional Goals Additional Goal #1: Pt to score >19 on DGI to indicate minimal falls risk.    Frequency Min 4X/week   Barriers to discharge Decreased caregiver support unsure accuracy of patient report of PLOF    Co-evaluation               AM-PAC PT "6 Clicks" Mobility  Outcome Measure Help needed turning from your back to your side while in a flat bed without using bedrails?: None Help needed moving from  lying on your back to sitting on the side of a flat bed without using bedrails?: None Help needed moving to and from a bed to a chair (including a wheelchair)?: A Little Help needed standing up from a chair using your arms (e.g., wheelchair or bedside chair)?: A Little Help needed to walk in hospital room?: A Little Help needed climbing 3-5 steps with a railing? : A Lot 6 Click Score: 19    End of Session Equipment Utilized During Treatment: Gait belt Activity Tolerance: Patient tolerated treatment well Patient left: in chair;with call bell/phone within reach;with chair alarm set (MD present) Nurse Communication: Mobility status PT Visit Diagnosis: Unsteadiness on feet (R26.81);Difficulty in walking, not elsewhere classified (R26.2)    Time: 4818-5631 PT Time Calculation (min) (ACUTE ONLY): 29 min   Charges:   PT Evaluation $PT Eval Moderate Complexity: 1 Mod PT Treatments $Gait Training: 8-22 mins        Lewis Shock, PT, DPT Acute Rehabilitation Services Pager #: [phone] Office #: [phone]   Iona Hansen 02/01/2020, 8:58 AM

## 2020-02-01 NOTE — Evaluation (Signed)
Occupational Therapy Evaluation Patient Details Name: Alec Sanchez MRN: [id number] DOB: 16-Mar-1955 Today's Date: 02/01/2020    History of Present Illness Alec Sanchez is a 64 y.o. male presenting with progressively worsening  Slurred speech and right sided weakness. PMH is significant for stroke with residual right sided weakness, HTN, HLD. UDS + for marijuana. h/o ETOH abuse.   Clinical Impression   PT admitted with R side weakness with pending workup. Pt currently with functional limitiations due to the deficits listed below (see OT problem list). Pt currently with balance deficits and lack of awareness of awareness to deficits. Pt with noted to have spillage from mouth with decr awareness with sensation but at sink used visual input to wash off face. Pt using L hand during oral care and reports R hand use harder per his self report. Pt requires RW as his balance is decreased from baseline. Pt reports "yes I need that right now"  Pt will benefit from skilled OT to increase their independence and safety with adls and balance to allow discharge SNF but will refuse so maximize home services. High risk for falls at this time. .     Follow Up Recommendations  SNF;Supervision - Intermittent (will decline SNF so will need max Home services)    Equipment Recommendations  Other (comment) (RW)    Recommendations for Other Services Other (comment) (home aide)     Precautions / Restrictions Precautions Precautions: Fall Precaution Comments: Residual R hemi from previous stroke      Mobility Bed Mobility Overal bed mobility: Needs Assistance Bed Mobility: Supine to Sit     Supine to sit: Supervision     General bed mobility comments: increased time and hob elevated    Transfers Overall transfer level: Needs assistance Equipment used: Rolling walker (2 wheeled) Transfers: Sit to/from Stand Sit to Stand: Min guard         General transfer comment: baseline uses Cane so this is  increased A level     Balance Overall balance assessment: Needs assistance           Standing balance-Leahy Scale: Fair                             ADL either performed or assessed with clinical judgement   ADL Overall ADL's : Needs assistance/impaired Eating/Feeding: Set up;Bed level Eating/Feeding Details (indicate cue type and reason): needs tea opended Grooming: Oral care;Wash/dry face;Min guard;Standing       Lower Body Bathing: Min guard;Sit to/from stand       Lower Body Dressing: Min guard;Sit to/from stand Lower Body Dressing Details (indicate cue type and reason): pt static standing and stepping out of boxers that were soiled        Toileting - Clothing Manipulation Details (indicate cue type and reason): noted to have wet boxers-- uncertain if partially filled urinal spilled or incontinence     Functional mobility during ADLs: Min guard;Rolling walker General ADL Comments: pt with balance deficits noted, pt reports increased effort with R UE and harder than baseline. Pt questioned multiple times and multiple ways during session about d/c options. pt reports he wants to "go home" pt declines SNF but does not appear agreeable to family housing at this time either. Pt will refuse likely and d/c home alone. Maximizing (A) from home care will benefit safety and decr risk for readmission     Vision         Perception  Praxis      Pertinent Vitals/Pain Pain Assessment: No/denies pain     Hand Dominance Right   Extremity/Trunk Assessment Upper Extremity Assessment Upper Extremity Assessment: RUE deficits/detail RUE Deficits / Details: grossly 4-/5, residual weakness from previous stroke   Lower Extremity Assessment Lower Extremity Assessment: Defer to PT evaluation   Cervical / Trunk Assessment Cervical / Trunk Assessment: Normal   Communication Communication Communication: Expressive difficulties   Cognition Arousal/Alertness:  Awake/alert Behavior During Therapy: Flat affect Overall Cognitive Status: Impaired/Different from baseline Area of Impairment: Safety/judgement;Awareness                         Safety/Judgement: Decreased awareness of safety;Decreased awareness of deficits Awareness: Emergent   General Comments: able to recall date/ day of week/ recalling reason for admission   General Comments  VSS reports plans to terminate smoking    Exercises     Shoulder Instructions      Home Living Family/patient expects to be discharged to:: Private residence Living Arrangements: Alone Available Help at Discharge: Family;Available PRN/intermittently Type of Home: Apartment Home Access: Elevator     Home Layout: One level     Bathroom Shower/Tub: Producer, television/film/video: Standard     Home Equipment: Cane - single point;Grab bars - tub/shower;Shower seat - built in   Additional Comments: has a son and daughter but reports that he does not plan to d/c to their homes. reports smoking prior to admission  Lives With: Alone    Prior Functioning/Environment Level of Independence: Independent with assistive device(s)        Comments: pt reports using a cane outside home and furniture walks in home, reports he drives and does his grocery shopping        OT Problem List: Decreased activity tolerance;Impaired balance (sitting and/or standing);Decreased cognition;Decreased safety awareness;Decreased knowledge of use of DME or AE;Decreased knowledge of precautions;Impaired UE functional use      OT Treatment/Interventions: Self-care/ADL training;Therapeutic exercise;Neuromuscular education;Energy conservation;DME and/or AE instruction;Manual therapy;Modalities;Therapeutic activities;Cognitive remediation/compensation;Patient/family education;Balance training    OT Goals(Current goals can be found in the care plan section) Acute Rehab OT Goals Patient Stated Goal: home OT Goal  Formulation: With patient Time For Goal Achievement: 02/15/20 Potential to Achieve Goals: Good  OT Frequency: Min 2X/week   Barriers to D/C: Decreased caregiver support  will dc home alone. OT specifically questioning about son and daughter (A) . pt states "i dont know about that dr Coral Else that"        Co-evaluation              AM-PAC OT "6 Clicks" Daily Activity     Outcome Measure Help from another person eating meals?: A Little Help from another person taking care of personal grooming?: A Little Help from another person toileting, which includes using toliet, bedpan, or urinal?: A Little Help from another person bathing (including washing, rinsing, drying)?: A Lot Help from another person to put on and taking off regular upper body clothing?: A Little Help from another person to put on and taking off regular lower body clothing?: A Little 6 Click Score: 17   End of Session Equipment Utilized During Treatment: Rolling walker Nurse Communication: Mobility status;Precautions (RN notified of transfer to MRI)  Activity Tolerance: Patient tolerated treatment well Patient left: Other (comment) (wheelchair with transport to MRI)  OT Visit Diagnosis: Unsteadiness on feet (R26.81)  Time: 6190-1222 OT Time Calculation (min): 25 min Charges:  OT General Charges $OT Visit: 1 Visit OT Evaluation $OT Eval Moderate Complexity: 1 Mod   Brynn, OTR/L  Acute Rehabilitation Services Pager: [phone] Office: [phone] .   Mateo Flow 02/01/2020, 2:36 PM

## 2020-02-01 NOTE — Progress Notes (Signed)
STROKE TEAM PROGRESS NOTE   INTERVAL HISTORY I have personally reviewed history of presenting illness with the patient, his daughter over the phone and other family member at the bedside, electronic medical records and imaging films in PACS.  He presented with worsening dysarthria and unsteady gait.  He has had a previous stroke with residual mild right-sided weakness but has stopped all his medications and does not have regular medical follow-up.  MRI scan shows a left basal ganglia and left periatrial white matter lacunar infarct but CT angiogram and carotid ultrasound showed near occlusive right carotid stenosis with a string sign.  Urine drug screen is positive for marijuana.  LDL cholesterol is elevated at 145 and hemoglobin A1c is 5.7.  Vitals:   01/31/20 2105 01/31/20 2304 02/01/20 0335 02/01/20 0810  BP: (!) 168/76 (!) 171/100 (!) 155/85 (!) 145/90  Pulse: (!) 59 66 63 62  Resp: Temp:  98 F (36.7 C) 98.3 F (36.8 C) (!) 97.5 F (36.4 C)  TempSrc:  Oral Oral Oral  SpO2: 98% 98% 100% 97%  Weight:      Height:       CBC:  Recent Labs  Lab 01/31/20 1031 01/31/20 1047  WBC 9.4  --   NEUTROABS 4.3  --   HGB 16.1 17.0  HCT 50.1 50.0  MCV 95.2  --   PLT 345  --    Basic Metabolic Panel:  Recent Labs  Lab 01/31/20 1031 01/31/20 1031 01/31/20 1047 02/01/20 0615  NA 140   < > 140 138  K 3.7   < > 4.1 3.5  CL 102   < > 103 100  CO2 25  --   --  25  GLUCOSE 97   < > 96 80  BUN 14   < > 20 15  CREATININE 1.44*   < > 1.40* 1.46*  CALCIUM 9.8  --   --  9.7   < > = values in this interval not displayed.   Lipid Panel:  Recent Labs  Lab 02/01/20 0246  CHOL 203*  TRIG 138  HDL 30*  CHOLHDL 6.8  VLDL 28  LDLCALC 098*   HgbA1c:  Recent Labs  Lab 02/01/20 0246  HGBA1C 5.7*   Urine Drug Screen:  Recent Labs  Lab 01/31/20 1120  LABOPIA NONE DETECTED  COCAINSCRNUR NONE DETECTED  LABBENZ NONE DETECTED  AMPHETMU NONE DETECTED  THCU POSITIVE*   LABBARB NONE DETECTED    Alcohol Level  Recent Labs  Lab 01/31/20 2026  ETH <10    IMAGING past 24 hours CT ANGIO HEAD W OR WO CONTRAST  Result Date: 01/31/2020 CLINICAL DATA:  64 year old male with difficulty walking and slurred speech x1 week. Age indeterminate small vessel disease on plain CT today. EXAM: CT ANGIOGRAPHY HEAD AND NECK TECHNIQUE: Multidetector CT imaging of the head and neck was performed using the standard protocol during bolus administration of intravenous contrast. Multiplanar CT image reconstructions and MIPs were obtained to evaluate the vascular anatomy. Carotid stenosis measurements (when applicable) are obtained utilizing NASCET criteria, using the distal internal carotid diameter as the denominator. CONTRAST:  80mL OMNIPAQUE IOHEXOL 350 MG/ML SOLN COMPARISON:  Head CT without contrast 1046 hours today. FINDINGS: CTA NECK Skeleton: Absent dentition. Lower cervical spine disc and endplate degeneration. No acute osseous abnormality identified. Upper chest: Negative. Other neck: No acute finding. Aortic arch: 3 vessel arch configuration with abundant soft mural plaque or thrombus throughout the arch (series 5, image  171) including a small stringy area of plaque or thrombus as demonstrated on series 9, image 145. Right carotid system: Mild to moderate brachiocephalic artery plaque without stenosis. Negative right CCA origin. Low-density plaque along the medial and anterior left CCA (series 5, image 113) without stenosis to the bifurcation. Bulky mostly soft plaque or thrombus at the right ICA bulb results in high-grade stenosis approaching a radiographic string sign is seen on series 9, image 107. Stenosis continues along a segment of about 12 mm but the vessel remains patent to the skull base. Left carotid system: Mild plaque at the left CCA origin without stenosis. Medial low-density plaque in the left CCA similar to the right side without stenosis (series 5, image 126). At the  left carotid bifurcation complex soft and calcified plaque appears partially ulcerated and results in maximal stenosis at the distal bulb (see series 8, image 135) numerically estimated at 66 % with respect to the distal vessel, but might be underestimated (series 7, image 184). The vessel remains patent to the skull base. Vertebral arteries: Mild to moderate soft plaque in the proximal right subclavian artery without stenosis, but the right vertebral artery is occluded from its origin to the C2 level, with only faint reconstitution at the C1-C2 level, and additional moderate to severe stenosis in the distal V3 segment on series 7, image 157. Soft plaque in the proximal left subclavian artery does not result in significant stenosis. Left vertebral artery origin is patent. There is mild to moderate stenosis in the left V1 segment due to soft plaque (series 8, image 165). Beyond that the left vertebral artery appears dominant and is widely patent to the skull base. CTA HEAD Posterior circulation: Better enhancement of the non dominant right V4 segment with patent right PICA origin. Dominant left V4 segment is patent to the basilar with mild irregularity and no stenosis. Normal left PICA origin. Patent basilar artery with mild irregularity, no significant stenosis. Patent SCA and PCA origins. Posterior communicating arteries are diminutive or absent. Mild bilateral PCA branch irregularity including in the left P2 segment (series 12, image 33). Anterior circulation: Both ICA siphons are patent. Combined soft and calcified plaque on the left results in widespread mild to moderate left siphon irregularity and mild to moderate cavernous segment stenosis on series 9, image 157. Less pronounced right siphon plaque until the supraclinoid segment where there is mild to moderate stenosis due to calcified atherosclerosis. Patent carotid termini, MCA and ACA origins. Dominant left A1 segment (series 11, image 20). Anterior  communicating artery is normal. There is mild bilateral ACA branch irregularity (series 12, image 30). Left MCA M1 segment demonstrates mild to moderate irregularity and stenosis as seen on series 10, image 20. Left MCA trifurcation remains patent. No left MCA branch occlusion identified. Mild to moderate left M2 and M3 branch irregularity (series 10, image 20). Right MCA M1 segment and bifurcation are patent with only mild irregularity. Mild right MCA branch irregularity otherwise. Venous sinuses: Early contrast timing but grossly patent major venous structures. Anatomic variants: Dominant left vertebral V4 segment. Review of the MIP images confirms the above findings IMPRESSION: 1. 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 Probable retrograde supply to the right V4 segment, with moderate to severe stenosis of the vessel at the skull base. 2. Severe atherosclerosis in the head and neck with predominantly soft plaque. Significant stenoses include: - bulky soft plaque at the Right ICA bulb with stenosis  approaching a RADIOGRAPHIC STRING SIGN (series 5, image 93). - 66% stenosis distal Left ICA bulb, although might be underestimated (same image). - up to moderate Left MCA M1 stenosis (series 10, image 20). 3. Involvement of the aortic arch including stringy plaque or thrombus in the distal arch (series 9, image 145). Aortic Atherosclerosis (ICD10-I70.0). 4. Widespread mild to moderate intracranial branch irregularity. Electronically Signed   By: Odessa FlemingH  Hall M.D.   On: 01/31/2020 21:57   DG Abd 1 View  Result Date: 02/01/2020 CLINICAL DATA:  Spinal cord stimulator. EXAM: ABDOMEN - 1 VIEW COMPARISON:  None. FINDINGS: No spinal cord stimulator or other implanted device is visible on these radiographs of the abdomen and pelvis. The spine is visualized superiorly to the T9 level on this study. Also note that no spinal cord stimulator was demonstrated  on the topograms of yesterday's head and neck CTA which included the thoracic spine from the T9 level and above. Scattered stool and gas are present in nondilated colon. There is no evidence of bowel obstruction. Excreted contrast material is noted in the bladder. IMPRESSION: No spinal cord stimulator is present. Electronically Signed   By: Sebastian AcheAllen  Grady M.D.   On: 02/01/2020 10:36   CT ANGIO NECK W OR WO CONTRAST  Result Date: 01/31/2020 CLINICAL DATA:  64 year old male with difficulty walking and slurred speech x1 week. Age indeterminate small vessel disease on plain CT today. EXAM: CT ANGIOGRAPHY HEAD AND NECK TECHNIQUE: Multidetector CT imaging of the head and neck was performed using the standard protocol during bolus administration of intravenous contrast. Multiplanar CT image reconstructions and MIPs were obtained to evaluate the vascular anatomy. Carotid stenosis measurements (when applicable) are obtained utilizing NASCET criteria, using the distal internal carotid diameter as the denominator. CONTRAST:  80mL OMNIPAQUE IOHEXOL 350 MG/ML SOLN COMPARISON:  Head CT without contrast 1046 hours today. FINDINGS: CTA NECK Skeleton: Absent dentition. Lower cervical spine disc and endplate degeneration. No acute osseous abnormality identified. Upper chest: Negative. Other neck: No acute finding. Aortic arch: 3 vessel arch configuration with abundant soft mural plaque or thrombus throughout the arch (series 5, image 171) including a small stringy area of plaque or thrombus as demonstrated on series 9, image 145. Right carotid system: Mild to moderate brachiocephalic artery plaque without stenosis. Negative right CCA origin. Low-density plaque along the medial and anterior left CCA (series 5, image 113) without stenosis to the bifurcation. Bulky mostly soft plaque or thrombus at the right ICA bulb results in high-grade stenosis approaching a radiographic string sign is seen on series 9, image 107. Stenosis  continues along a segment of about 12 mm but the vessel remains patent to the skull base. Left carotid system: Mild plaque at the left CCA origin without stenosis. Medial low-density plaque in the left CCA similar to the right side without stenosis (series 5, image 126). At the left carotid bifurcation complex soft and calcified plaque appears partially ulcerated and results in maximal stenosis at the distal bulb (see series 8, image 135) numerically estimated at 66 % with respect to the distal vessel, but might be underestimated (series 7, image 184). The vessel remains patent to the skull base. Vertebral arteries: Mild to moderate soft plaque in the proximal right subclavian artery without stenosis, but the right vertebral artery is occluded from its origin to the C2 level, with only faint reconstitution at the C1-C2 level, and additional moderate to severe stenosis in the distal V3 segment on series 7, image 157. Soft plaque in  the proximal left subclavian artery does not result in significant stenosis. Left vertebral artery origin is patent. There is mild to moderate stenosis in the left V1 segment due to soft plaque (series 8, image 165). Beyond that the left vertebral artery appears dominant and is widely patent to the skull base. CTA HEAD Posterior circulation: Better enhancement of the non dominant right V4 segment with patent right PICA origin. Dominant left V4 segment is patent to the basilar with mild irregularity and no stenosis. Normal left PICA origin. Patent basilar artery with mild irregularity, no significant stenosis. Patent SCA and PCA origins. Posterior communicating arteries are diminutive or absent. Mild bilateral PCA branch irregularity including in the left P2 segment (series 12, image 33). Anterior circulation: Both ICA siphons are patent. Combined soft and calcified plaque on the left results in widespread mild to moderate left siphon irregularity and mild to moderate cavernous segment  stenosis on series 9, image 157. Less pronounced right siphon plaque until the supraclinoid segment where there is mild to moderate stenosis due to calcified atherosclerosis. Patent carotid termini, MCA and ACA origins. Dominant left A1 segment (series 11, image 20). Anterior communicating artery is normal. There is mild bilateral ACA branch irregularity (series 12, image 30). Left MCA M1 segment demonstrates mild to moderate irregularity and stenosis as seen on series 10, image 20. Left MCA trifurcation remains patent. No left MCA branch occlusion identified. Mild to moderate left M2 and M3 branch irregularity (series 10, image 20). Right MCA M1 segment and bifurcation are patent with only mild irregularity. Mild right MCA branch irregularity otherwise. Venous sinuses: Early contrast timing but grossly patent major venous structures. Anatomic variants: Dominant left vertebral V4 segment. Review of the MIP images confirms the above findings IMPRESSION: 1. 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 Probable retrograde supply to the right V4 segment, with moderate to severe stenosis of the vessel at the skull base. 2. Severe atherosclerosis in the head and neck with predominantly soft plaque. Significant stenoses include: - bulky soft plaque at the Right ICA bulb with stenosis approaching a RADIOGRAPHIC STRING SIGN (series 5, image 93). - 66% stenosis distal Left ICA bulb, although might be underestimated (same image). - up to moderate Left MCA M1 stenosis (series 10, image 20). 3. Involvement of the aortic arch including stringy plaque or thrombus in the distal arch (series 9, image 145). Aortic Atherosclerosis (ICD10-I70.0). 4. Widespread mild to moderate intracranial branch irregularity. Electronically Signed   By: Odessa Fleming M.D.   On: 01/31/2020 21:57   ECHOCARDIOGRAM COMPLETE  Result Date: 02/01/2020    ECHOCARDIOGRAM REPORT   Patient  Name:   Marya Amsler Speegle Date of Exam: 02/01/2020 Medical Rec #:  [id number]   Height:       67.0 in Accession #:    [id number]  Weight:       207.9 lb Date of Birth:  27-Aug-1955   BSA:          2.056 m Patient Age:    64 years    BP:           155/85 mmHg Patient Gender: M           HR:           74 bpm. Exam Location:  Inpatient Procedure: 2D Echo, Cardiac Doppler and Color Doppler Indications:    Stroke 434.91 / I163.9  History:        Patient has no  prior history of Echocardiogram examinations.  Sonographer:    Renella Cunas RDCS Referring Phys: [phone] BRIAN MILLER IMPRESSIONS  1. Left ventricular ejection fraction, by estimation, is 55 to 60%. The left ventricle has normal function. The left ventricle has no regional wall motion abnormalities. Left ventricular diastolic parameters are consistent with Grade I diastolic dysfunction (impaired relaxation).  2. Right ventricular systolic function is normal. The right ventricular size is normal.  3. The mitral valve is normal in structure. Trivial mitral valve regurgitation. No evidence of mitral stenosis.  4. The aortic valve is normal in structure. Aortic valve regurgitation is not visualized. Mild aortic valve sclerosis is present, with no evidence of aortic valve stenosis.  5. The inferior vena cava is normal in size with greater than 50% respiratory variability, suggesting right atrial pressure of 3 mmHg. FINDINGS  Left Ventricle: Left ventricular ejection fraction, by estimation, is 55 to 60%. The left ventricle has normal function. The left ventricle has no regional wall motion abnormalities. The left ventricular internal cavity size was normal in size. There is  no left ventricular hypertrophy. Left ventricular diastolic parameters are consistent with Grade I diastolic dysfunction (impaired relaxation). Normal left ventricular filling pressure. Right Ventricle: The right ventricular size is normal. No increase in right ventricular wall thickness. Right ventricular  systolic function is normal. Left Atrium: Left atrial size was normal in size. Right Atrium: Right atrial size was normal in size. Pericardium: There is no evidence of pericardial effusion. Mitral Valve: The mitral valve is normal in structure. Mild mitral annular calcification. Trivial mitral valve regurgitation. No evidence of mitral valve stenosis. Tricuspid Valve: The tricuspid valve is normal in structure. Tricuspid valve regurgitation is not demonstrated. No evidence of tricuspid stenosis. Aortic Valve: The aortic valve is normal in structure. Aortic valve regurgitation is not visualized. Mild aortic valve sclerosis is present, with no evidence of aortic valve stenosis. Pulmonic Valve: The pulmonic valve was normal in structure. Pulmonic valve regurgitation is not visualized. No evidence of pulmonic stenosis. Aorta: The aortic root is normal in size and structure. Venous: The inferior vena cava is normal in size with greater than 50% respiratory variability, suggesting right atrial pressure of 3 mmHg. IAS/Shunts: No atrial level shunt detected by color flow Doppler.  LEFT VENTRICLE PLAX 2D LVIDd:         3.10 cm      Diastology LVIDs:         2.40 cm      LV e' medial:    4.14 cm/s LV PW:         1.00 cm      LV E/e' medial:  9.7 LV IVS:        1.00 cm      LV e' lateral:   6.16 cm/s LVOT diam:     2.30 cm      LV E/e' lateral: 6.5 LV SV:         67 LV SV Index:   33 LVOT Area:     4.15 cm  LV Volumes (MOD) LV vol d, MOD A2C: 101.0 ml LV vol d, MOD A4C: 91.5 ml LV vol s, MOD A2C: 51.7 ml LV vol s, MOD A4C: 47.8 ml LV SV MOD A2C:     49.3 ml LV SV MOD A4C:     91.5 ml LV SV MOD BP:      49.6 ml RIGHT VENTRICLE RV S prime:     9.64 cm/s TAPSE (M-mode): 1.9 cm LEFT ATRIUM  Index       RIGHT ATRIUM           Index LA diam:        3.00 cm 1.46 cm/m  RA Area:     12.40 cm LA Vol (A2C):   22.4 ml 10.90 ml/m RA Volume:   26.50 ml  12.89 ml/m LA Vol (A4C):   23.1 ml 11.24 ml/m LA Biplane Vol: 24.3 ml  11.82 ml/m  AORTIC VALVE LVOT Vmax:   105.00 cm/s LVOT Vmean:  58.900 cm/s LVOT VTI:    0.161 m  AORTA Ao Root diam: 3.20 cm MITRAL VALVE MV Area (PHT): 2.20 cm    SHUNTS MV Decel Time: 345 msec    Systemic VTI:  0.16 m MV E velocity: 40.10 cm/s  Systemic Diam: 2.30 cm MV A velocity: 57.50 cm/s MV E/A ratio:  0.70 Mihai Croitoru MD Electronically signed by Thurmon Fair MD Signature Date/Time: 02/01/2020/10:29:16 AM    Final    VAS US CAROTID  Result Date: 02/01/2020 Carotid Arterial Duplex Study Indications:                            CVA. Comparison Study:                       No prior study Pre-Surgical Evaluation & Surgical      Stenosis at RICA only. ICA is normal Correlation                             past the stenosis. Anatomy on the right                                         is within normal limits.Right                                         bifurcation is located near the                                         mandible. Performing Technologist: Gertie Fey MHA, RDMS, RVT, RDCS  Examination Guidelines: A complete evaluation includes B-mode imaging, spectral Doppler, color Doppler, and power Doppler as needed of all accessible portions of each vessel. Bilateral testing is considered an integral part of a complete examination. Limited examinations for reoccurring indications may be performed as noted.  Right Carotid Findings: +----------+--------+--------+--------+-----------------------+--------+           PSV cm/sEDV cm/sStenosisPlaque Description     Comments +----------+--------+--------+--------+-----------------------+--------+ CCA Prox  104     12              smooth and heterogenous         +----------+--------+--------+--------+-----------------------+--------+ CCA Distal62      13              smooth and heterogenous         +----------+--------+--------+--------+-----------------------+--------+ ICA Prox  538     232     80-99%  smooth and homogeneous           +----------+--------+--------+--------+-----------------------+--------+ ICA Mid   202  56                                              +----------+--------+--------+--------+-----------------------+--------+ ICA Distal24      12                                              +----------+--------+--------+--------+-----------------------+--------+ ECA       168     18              smooth and heterogenous         +----------+--------+--------+--------+-----------------------+--------+ +----------+--------+-------+----------------+-------------------+           PSV cm/sEDV cmsDescribe        Arm Pressure (mmHG) +----------+--------+-------+----------------+-------------------+ Subclavian89             Multiphasic, WNL                    +----------+--------+-------+----------------+-------------------+ +---------+--------+--+--------+-+---------+ VertebralPSV cm/s10EDV cm/s3Antegrade +---------+--------+--+--------+-+---------+  Left Carotid Findings: +----------+-------+-------+--------+---------------------------------+--------+           PSV    EDV    StenosisPlaque Description               Comments           cm/s   cm/s                                                     +----------+-------+-------+--------+---------------------------------+--------+ CCA Prox  126    23                                                       +----------+-------+-------+--------+---------------------------------+--------+ CCA Distal72     14             smooth, heterogenous and calcific         +----------+-------+-------+--------+---------------------------------+--------+ ICA Prox  175    73     40-59%  heterogenous, irregular and                                               calcific                                  +----------+-------+-------+--------+---------------------------------+--------+ ICA Mid   145    46                                                        +----------+-------+-------+--------+---------------------------------+--------+ ICA Distal87     24                                                       +----------+-------+-------+--------+---------------------------------+--------+  ECA       106    13                                                       +----------+-------+-------+--------+---------------------------------+--------+ +----------+--------+--------+----------------+-------------------+           PSV cm/sEDV cm/sDescribe        Arm Pressure (mmHG) +----------+--------+--------+----------------+-------------------+ [id number]              Multiphasic, WNL                    +----------+--------+--------+----------------+-------------------+ +---------+--------+--+--------+--+---------+ VertebralPSV cm/s63EDV cm/s18Antegrade +---------+--------+--+--------+--+---------+   Summary: Right 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 Left Carotid: Velocities in the left ICA are consistent with an upper range               40-59% stenosis. Vertebrals:  Bilateral vertebral arteries demonstrate antegrade flow. Subclavians: Normal flow hemodynamics were seen in bilateral subclavian              arteries. *See table(s) above for measurements and observations.     Preliminary     PHYSICAL EXAM Middle-aged African-American male not in distress. . Afebrile. Head is nontraumatic. Neck is supple without bruit.    Cardiac exam no murmur or gallop. Lungs are clear to auscultation. Distal pulses are well felt. Neurological Exam : Patient is awake alert oriented time place and person.  No aphasia apraxia or dysarthria.  Extraocular movements are full range without nystagmus.  Blinks to threat bilaterally.  Mild right lower facial asymmetry when he smiles.  Tongue midline.  Mild right hemiparesis but effort is poor for isolated muscle testing.   Fine finger movements are diminished on the right compared to the left orbits left over right upper extremity.  Sensation appears preserved bilaterally.  Coordination is slow but accurate.  Gait not tested. ASSESSMENT/PLAN [name] is a 64 y.o. male with history of stroke, tobacco presenting with slurred speech x 3 days. He had stopped taking meds prior to admission.   Stroke:  L basal ganglia and L white matter infarcts secondary to small vessel disease .asymptomatic near occlusive right carotid stenosis with string sign   CT head L corona radiata hypodensity. Small vessel disease. Atrophy. Trace stranding occipital soft tissues.   CTA head & neck no ELVO. 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 Severe atherosclerosis:  R ICA string sign; L ICA 66%; moderate L M1. Distal aortic arch stringy plaque w/ thrombus. Moderate to moderate intracranial branch irregularity.   MRI  L basal ganglia / corona radiata infarct. Punctate L periatrial white matter infarct. Moderate small vessel disease w/ chronic lacunes and microhemorrhages.  Carotid Doppler  R ICA 80-99%, L ICA 40-59%  2D Echo EF 55-60%. No source of embolus   LDL 145  HgbA1c 5.7  VTE prophylaxis - Lovenox 40 mg sq daily   none (had stopped taking prescribed aspirin and plavix) prior to admission, now on aspirin 325 mg daily and clopidogrel 75 mg daily.   Therapy recommendations:  HH SLP, PT  Disposition:  Return home  Bilateral Carotid Stenosis   Carotid Doppler  R ICA 80-99%, L ICA 40-59%   CTA neck R  ICA string sign; L ICA 66%. Distal aortic arch stringy plaque w/ thrombus.  Not the etiology of current stroke  Pt noncomplaint with all meds PTA  Discussed surgical intervention, consult "I am not doing any surgery!" discussed w/ pt, family member at bedside and daughter on phone. They will discuss further  Can do as OP if he is agreeable  Aortic Arch Thromus  CTA neck Distal aortic arch stringy  plaque w/ thrombus.  Pt noncompliant w/ meds  Not a good AC candidate  Hypertension  Stable . Permissive hypertension (OK if < 220/120) but gradually normalize in 5-7 days . Long-term BP goal normotensive  Hyperlipidemia  Home meds:  Prescribed lipitor 80 but had stopped taking  Resumed lipitor 80 in hospital  LDL 145, goal < 70  Continue statin at discharge  Prediabetes  HgbA1c 5.7  Other Stroke Risk Factors  Advanced Age >/= 53   Cigarette smoker, advised to stop smoking  Concern for alcohol use  Substance abuse - UDS:  THC POSITIVE. Patient advised to stop using due to stroke risk.  Obesity, Body mass index is 32.56 kg/m., BMI >/= 30 associated with increased stroke risk, recommend weight loss, diet and exercise as appropriate   Hx stroke/TIA - details not available   Family hx stroke (father)  Other Active Problems  Abnormal EKG  CKD vs AKI  Concern for cognitive impairment  Hospital day # 0 Patient has presented with left subcortical lacunar stroke and has a right carotid artery string sign with high-grade stenosis and is at very high risk for recurrent strokes.  Patient has had a previous stroke but had been noncompliant with his secondary stroke prevention medications as well as physician follow-up.  I counseled him to be compliant in the future.  Recommend aspirin Plavix for 3 months followed by aspirin alone.  Patient counseled to quit smoking cigarettes and marijuana.  Also consider vascular surgery consult for elective right carotid revascularization over the next week or 2 to prevent large right brain stroke.  Patient flatlylly refused to talk to the vascular surgeon and I asked him and his family to discuss this calmly and make it decision and let us know.  Discussed with family practice resident on call.  Greater than 50% time during the 35-minute visit was spent in counseling and coordination of care about his lacunar stroke and high-grade carotid  stenosis and discussion about further stroke risk and risk prevention and answering questions Delia Heady, MD  To contact Stroke Continuity provider, please refer to [email or web address]. After hours, contact General Neurology

## 2020-02-01 NOTE — Hospital Course (Addendum)
Alec Sanchez is a 64 y.o. male that presented with acute ischemic stroke with right sided weakness. PMH significant for tobacco use, cerebrovascular disease, HTN, HDL, marijuana use, h/o alcohol abuse.  Ischemic stroke with right-sided weakness Patient presented with right-sided weakness and slurred speech x3 days. In the ED patient received 1 dose ASA 325 and neurology was consulted.  Neuro exam notable for mild right lower facial asymmetry when smiling, tongue deviation to the right, mild right hemiparesis, left by lateral deviation with accommodation. Neurology was consulted. CT head notable for corona radiata hypodensity, small vessel disease, atrophy.  CTA head and neck showed severe atherosclerosis, right ICA string sign.  MRI showed left basal ganglia/corona radiata infarct, punctate left periatrial white matter infarct, moderate small vessel disease, chronic lacunes and microhemorrhages. Carotid Doppler showed right ICA 80-99%, left ICA 40-59%.  Echo showed EF of 55-60%. Lipid panel showed LDL of 145, HbA1c 5.7. Patient was restarted on aspirin 81 mg and Plavix 75 mg daily-this should be continued for at least 3 months.  Bilateral carotid stenosis Carotid Doppler showed right ICA 80-99%, left ICA 40-59%. Reviewed showed right ICA string sign and left ICA 66%. Patient seen by vascular surgery in the hospital and will follow-up outpatient after rehab to discuss right-sided transcarotid artery stenting vs carotid endarterectomy.   Abnormal ECG Patient noted to have bradycardia. At night, patient noted to have a 2.3-second pause that was asymptomatic. Suspected component of OSA. Patient asymptomatic during episode and entire hospitalization.  Hypertension Permissive hypertension during hospitalization due to recent stroke with parameters of intervention if > 220/120. Patient required no further intervention, would recommend follow-up outpatient to start back regular blood pressure  medications.  Hyperlipidemia Patient has a history of hyperlipidemia; LDL in the hospital was 145. Goal LDL is <70. Patient discharged with atorvastatin 80 mg daily.  Elevated creatinine: CKD versus AKI Patient did have elevated creatinine on admitted, could not determine if this was the patient's baseline creatinine level or signs of an acute kidney injury. Patient creatinine remained stable during admission at around 1.4.  Prediabetes HbA1c elevated at 5.7, recommend outpatient follow-up and diet modification/exercise.   Discharge follow-up recommendations:  -Consider adding Zetia for tighter cholesterol control  - Emphasize importance of medication compliance - Monitor A1c. A1c 5.7 in hospital. Recommend diet modifications and exercise. 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 -Outpatient Vascular surgery follow up with Dr. Turner Daniels in 3-4 weeks for elective right transcarotid artery stenting vs carotid endarterectomy.  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   - BP meds were held on admission in setting of acute CVA for permissive HTN.  Patient was restarted on norvasc 10mg  at discharge.  Restart HCTZ and lisinopril as able. - Prazosin was held on discharge, patient unsure of indication for use.  It was held in setting of permissive HTN.  Please restart as indicated. - Patient indicated he was not taking Paroxetine for several weeks prior to admission and it was not continued on admission. Consider using a different SSRI.

## 2020-02-01 NOTE — Progress Notes (Signed)
  Echocardiogram 2D Echocardiogram has been performed.  Alec Sanchez 02/01/2020, 9:46 AM

## 2020-02-01 NOTE — Progress Notes (Signed)
Family Medicine Teaching Service Daily Progress Note Intern Pager: [phone]  Patient name: Alec Sanchez Medical record number: [id number] Date of birth: [birth year] Age: 64 y.o. Gender: male  Primary Care Provider: [facility] Consultants: Neuro Code Status: Partial, DNI   Pt Overview and Major Events to Date:  12/6 - Admitted for stroke  Assessment and Plan: Ahmari Duerson is a 64 y.o. male that presented with acute ischemic stroke with right sided weakness. PMH significant for tobacco use, cerebrovascular disease, HTN, HDL, marijuana use, h/o alcohol abuse.   Ischemic stroke Neuro exam significant for lateral deviation of left eye with accomodation, tongue deviation to the right. Radiology initially stated that the patient could not have an MRI due to having a spinal stimulator, which the patient denied ever having; KUB obtained and does not appear to show a stimulator, awaiting official reading of KUB by MRI provider. HbA1c 5.7. Lipid panel showed cholesterol 203, HDL 30, LDL 145, TG 138. TSH 2.330. Echo EF 55-60%, grade I diastolic dysfunction.  - Neuro consulted, appreciate recs - Continue DAPT ASA 325mg  and Plavix 75mg  - PT/OT/SLP - Continue atorvastatin 80mg  - Continuous cardiac monitoring - F/u if able to do MRI  Abnormal ECG Patient has been bradycardic since admission with no evidence of AV block, overnight patient had a 2.28 second pause. Patient asymptomatic. EKG sinus bradycardia with old septal infarct, no signs of current infarct. - Continuous cardiac monitoring  Hypertension Current BP is 155/85, overnight patient elevated to 183/90. Permissive HTN due to acute ischemic stroke. Patient was non-compliant with home medications, consider restarting some if he remains consistently elevated.  - Continue to monitor  Elevated creatinine: CKD vs AKIB Cr elevated at 1.44 on admission, baseline is unknown. Repeat Cr this AM 1.46. - Continue to monitor - Consider  IVF  Concern for cognitive impairment Patient appears to have some cognitive impairment, possible baseline impairment that could be exacerbated during this hospitalization in the setting of acute stroke - MOCA as outpatient - Patient will likely need discharge to SNF or home with family and home health  Pre-diabetes Patient noted to have HbA1c of 5.7.  - Encourage diet modification and exercise.  - Recommend outpatient follow-up  Concern for alcohol use - CIWAs q6h - PRN ativan to be added if consistently elevated CIWAs  Tobacco use - Evaluate need for nicotine patch - Encourage cessation    FEN/GI: Heart healthy diet PPx: Lovenox   Status is: Observation  The patient remains OBS appropriate and will d/c before 2 midnights.  Dispo: The patient is from: Home              Anticipated d/c is to: SNF              Anticipated d/c date is: 1 day              Patient currently is not medically stable to d/c.    Subjective:  Patient reports that he feels he is doing good today and that some of his leg weakness has improved. He adamantly denies that he has ever had a spinal stimulator  Objective: Temp:  [98 F (36.7 C)-98.3 F (36.8 C)] 98.3 F (36.8 C) (12/07 0335) Pulse Rate:  [51-70] 63 (12/07 0335) Resp:  [9-23] 16 (12/07 0335) BP: (120-187)/(76-111) 155/85 (12/07 0335) SpO2:  [96 %-100 %] 100 % (12/07 0335) Weight:  [94.3 kg] 94.3 kg (12/06 1941) Physical Exam: General: NAD, sitting up in chair next to bed, PT provider in  room Cardiovascular: Bradycardia, no m/r/g appreciated Respiratory: CTAB, normal WOB Abdomen: soft, non-tender, non-distended Neuro:    - CN II: PERRL    - CN III, IV, VI: EOMI    - CN V: normal sensation V1/V2/V3    - CN VII: symmetric smile and brown raise    - CN VIII: Normal hearing    - CN IX, X: symmetric palate raise    - CN XI: 5/5 shoulder shrug    - CN XII: tongue protrusion to right    - UE and LE strength 5/5    - Slow but no  ataxia on finger to nose    Laboratory: Recent Labs  Lab 01/31/20 1031 01/31/20 1047  WBC 9.4  --   HGB 16.1 17.0  HCT 50.1 50.0  PLT 345  --    Recent Labs  Lab 01/31/20 1031 01/31/20 1047  NA 140 140  K 3.7 4.1  CL 102 103  CO2 25  --   BUN 14 20  CREATININE 1.44* 1.40*  CALCIUM 9.8  --   PROT 8.3*  --   BILITOT 0.7  --   ALKPHOS 61  --   ALT 26  --   AST 19  --   GLUCOSE 97 96     Imaging/Diagnostic Tests: CT ANGIO HEAD W OR WO CONTRAST  Result Date: 01/31/2020 CLINICAL DATA:  64 year old male with difficulty walking and slurred speech x1 week. Age indeterminate small vessel disease on plain CT today. EXAM: CT ANGIOGRAPHY HEAD AND NECK TECHNIQUE: Multidetector CT imaging of the head and neck was performed using the standard protocol during bolus administration of intravenous contrast. Multiplanar CT image reconstructions and MIPs were obtained to evaluate the vascular anatomy. Carotid stenosis measurements (when applicable) are obtained utilizing NASCET criteria, using the distal internal carotid diameter as the denominator. CONTRAST:  80mL OMNIPAQUE IOHEXOL 350 MG/ML SOLN COMPARISON:  Head CT without contrast 1046 hours today. FINDINGS: CTA NECK Skeleton: Absent dentition. Lower cervical spine disc and endplate degeneration. No acute osseous abnormality identified. Upper chest: Negative. Other neck: No acute finding. Aortic arch: 3 vessel arch configuration with abundant soft mural plaque or thrombus throughout the arch (series 5, image 171) including a small stringy area of plaque or thrombus as demonstrated on series 9, image 145. Right carotid system: Mild to moderate brachiocephalic artery plaque without stenosis. Negative right CCA origin. Low-density plaque along the medial and anterior left CCA (series 5, image 113) without stenosis to the bifurcation. Bulky mostly soft plaque or thrombus at the right ICA bulb results in high-grade stenosis approaching a radiographic  string sign is seen on series 9, image 107. Stenosis continues along a segment of about 12 mm but the vessel remains patent to the skull base. Left carotid system: Mild plaque at the left CCA origin without stenosis. Medial low-density plaque in the left CCA similar to the right side without stenosis (series 5, image 126). At the left carotid bifurcation complex soft and calcified plaque appears partially ulcerated and results in maximal stenosis at the distal bulb (see series 8, image 135) numerically estimated at 66 % with respect to the distal vessel, but might be underestimated (series 7, image 184). The vessel remains patent to the skull base. Vertebral arteries: Mild to moderate soft plaque in the proximal right subclavian artery without stenosis, but the right vertebral artery is occluded from its origin to the C2 level, with only faint reconstitution at the C1-C2 level, and additional moderate to severe  stenosis in the distal V3 segment on series 7, image 157. Soft plaque in the proximal left subclavian artery does not result in significant stenosis. Left vertebral artery origin is patent. There is mild to moderate stenosis in the left V1 segment due to soft plaque (series 8, image 165). Beyond that the left vertebral artery appears dominant and is widely patent to the skull base. CTA HEAD Posterior circulation: Better enhancement of the non dominant right V4 segment with patent right PICA origin. Dominant left V4 segment is patent to the basilar with mild irregularity and no stenosis. Normal left PICA origin. Patent basilar artery with mild irregularity, no significant stenosis. Patent SCA and PCA origins. Posterior communicating arteries are diminutive or absent. Mild bilateral PCA branch irregularity including in the left P2 segment (series 12, image 33). Anterior circulation: Both ICA siphons are patent. Combined soft and calcified plaque on the left results in widespread mild to moderate left siphon  irregularity and mild to moderate cavernous segment stenosis on series 9, image 157. Less pronounced right siphon plaque until the supraclinoid segment where there is mild to moderate stenosis due to calcified atherosclerosis. Patent carotid termini, MCA and ACA origins. Dominant left A1 segment (series 11, image 20). Anterior communicating artery is normal. There is mild bilateral ACA branch irregularity (series 12, image 30). Left MCA M1 segment demonstrates mild to moderate irregularity and stenosis as seen on series 10, image 20. Left MCA trifurcation remains patent. No left MCA branch occlusion identified. Mild to moderate left M2 and M3 branch irregularity (series 10, image 20). Right MCA M1 segment and bifurcation are patent with only mild irregularity. Mild right MCA branch irregularity otherwise. Venous sinuses: Early contrast timing but grossly patent major venous structures. Anatomic variants: Dominant left vertebral V4 segment. Review of the MIP images confirms the above findings IMPRESSION: 1. 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 Probable retrograde supply to the right V4 segment, with moderate to severe stenosis of the vessel at the skull base. 2. Severe atherosclerosis in the head and neck with predominantly soft plaque. Significant stenoses include: - bulky soft plaque at the Right ICA bulb with stenosis approaching a RADIOGRAPHIC STRING SIGN (series 5, image 93). - 66% stenosis distal Left ICA bulb, although might be underestimated (same image). - up to moderate Left MCA M1 stenosis (series 10, image 20). 3. Involvement of the aortic arch including stringy plaque or thrombus in the distal arch (series 9, image 145). Aortic Atherosclerosis (ICD10-I70.0). 4. Widespread mild to moderate intracranial branch irregularity. Electronically Signed   By: Odessa Fleming M.D.   On: 01/31/2020 21:57   CT HEAD WO CONTRAST  Addendum Date:  01/31/2020   ADDENDUM REPORT: 01/31/2020 11:25 ADDENDUM: These results were called by telephone at the time of interpretation on 01/31/2020 at 11:24 am to provider Dr. Hyacinth Meeker, Who verbally acknowledged these results. Electronically Signed   By: Donzetta Kohut M.D.   On: 01/31/2020 11:25   Result Date: 01/31/2020 CLINICAL DATA:  Difficulty walking with slurred speech for the past week. EXAM: CT HEAD WITHOUT CONTRAST TECHNIQUE: Contiguous axial images were obtained from the base of the skull through the vertex without intravenous contrast. COMPARISON:  None. FINDINGS: Brain: No evidence of, hemorrhage, hydrocephalus, extra-axial collection or mass lesion/mass effect. Signs of atrophy and chronic microvascular ischemic change. There is however an area of hypodensity in the LEFT corona radiata. This area in the absence of priors is indeterminate. Vascular: No  hyperdense vessel or unexpected calcification. Skull: Normal. Negative for fracture or focal lesion. Sinuses/Orbits: Visualized paranasal sinuses and orbits are normal. Other: Trace stranding in the occipital soft tissues, correlate with any history of trauma in this area. No hematoma. IMPRESSION: 1. Signs of atrophy and chronic microvascular ischemic change. There is however an area of hypodensity in the LEFT corona radiata out of which is indeterminate in the absence of priors. Acute or subacute infarct could potentially have this appearance. If there is high clinical concern MRI may be helpful for further assessment. 2. Trace stranding in the occipital soft tissues, correlate with any history of trauma in this area. No hematoma. Call is out to the referring provider to further discuss findings in the above case. Electronically Signed: By: Donzetta KohutGeoffrey  Wile M.D. On: 01/31/2020 11:09   CT ANGIO NECK W OR WO CONTRAST  Result Date: 01/31/2020 CLINICAL DATA:  64 year old male with difficulty walking and slurred speech x1 week. Age indeterminate small vessel disease  on plain CT today. EXAM: CT ANGIOGRAPHY HEAD AND NECK TECHNIQUE: Multidetector CT imaging of the head and neck was performed using the standard protocol during bolus administration of intravenous contrast. Multiplanar CT image reconstructions and MIPs were obtained to evaluate the vascular anatomy. Carotid stenosis measurements (when applicable) are obtained utilizing NASCET criteria, using the distal internal carotid diameter as the denominator. CONTRAST:  80mL OMNIPAQUE IOHEXOL 350 MG/ML SOLN COMPARISON:  Head CT without contrast 1046 hours today. FINDINGS: CTA NECK Skeleton: Absent dentition. Lower cervical spine disc and endplate degeneration. No acute osseous abnormality identified. Upper chest: Negative. Other neck: No acute finding. Aortic arch: 3 vessel arch configuration with abundant soft mural plaque or thrombus throughout the arch (series 5, image 171) including a small stringy area of plaque or thrombus as demonstrated on series 9, image 145. Right carotid system: Mild to moderate brachiocephalic artery plaque without stenosis. Negative right CCA origin. Low-density plaque along the medial and anterior left CCA (series 5, image 113) without stenosis to the bifurcation. Bulky mostly soft plaque or thrombus at the right ICA bulb results in high-grade stenosis approaching a radiographic string sign is seen on series 9, image 107. Stenosis continues along a segment of about 12 mm but the vessel remains patent to the skull base. Left carotid system: Mild plaque at the left CCA origin without stenosis. Medial low-density plaque in the left CCA similar to the right side without stenosis (series 5, image 126). At the left carotid bifurcation complex soft and calcified plaque appears partially ulcerated and results in maximal stenosis at the distal bulb (see series 8, image 135) numerically estimated at 66 % with respect to the distal vessel, but might be underestimated (series 7, image 184). The vessel remains  patent to the skull base. Vertebral arteries: Mild to moderate soft plaque in the proximal right subclavian artery without stenosis, but the right vertebral artery is occluded from its origin to the C2 level, with only faint reconstitution at the C1-C2 level, and additional moderate to severe stenosis in the distal V3 segment on series 7, image 157. Soft plaque in the proximal left subclavian artery does not result in significant stenosis. Left vertebral artery origin is patent. There is mild to moderate stenosis in the left V1 segment due to soft plaque (series 8, image 165). Beyond that the left vertebral artery appears dominant and is widely patent to the skull base. CTA HEAD Posterior circulation: Better enhancement of the non dominant right V4 segment with patent right PICA  origin. Dominant left V4 segment is patent to the basilar with mild irregularity and no stenosis. Normal left PICA origin. Patent basilar artery with mild irregularity, no significant stenosis. Patent SCA and PCA origins. Posterior communicating arteries are diminutive or absent. Mild bilateral PCA branch irregularity including in the left P2 segment (series 12, image 33). Anterior circulation: Both ICA siphons are patent. Combined soft and calcified plaque on the left results in widespread mild to moderate left siphon irregularity and mild to moderate cavernous segment stenosis on series 9, image 157. Less pronounced right siphon plaque until the supraclinoid segment where there is mild to moderate stenosis due to calcified atherosclerosis. Patent carotid termini, MCA and ACA origins. Dominant left A1 segment (series 11, image 20). Anterior communicating artery is normal. There is mild bilateral ACA branch irregularity (series 12, image 30). Left MCA M1 segment demonstrates mild to moderate irregularity and stenosis as seen on series 10, image 20. Left MCA trifurcation remains patent. No left MCA branch occlusion identified. Mild to  moderate left M2 and M3 branch irregularity (series 10, image 20). Right MCA M1 segment and bifurcation are patent with only mild irregularity. Mild right MCA branch irregularity otherwise. Venous sinuses: Early contrast timing but grossly patent major venous structures. Anatomic variants: Dominant left vertebral V4 segment. Review of the MIP images confirms the above findings IMPRESSION: 1. 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 Probable retrograde supply to the right V4 segment, with moderate to severe stenosis of the vessel at the skull base. 2. Severe atherosclerosis in the head and neck with predominantly soft plaque. Significant stenoses include: - bulky soft plaque at the Right ICA bulb with stenosis approaching a RADIOGRAPHIC STRING SIGN (series 5, image 93). - 66% stenosis distal Left ICA bulb, although might be underestimated (same image). - up to moderate Left MCA M1 stenosis (series 10, image 20). 3. Involvement of the aortic arch including stringy plaque or thrombus in the distal arch (series 9, image 145). Aortic Atherosclerosis (ICD10-I70.0). 4. Widespread mild to moderate intracranial branch irregularity. Electronically Signed   By: Odessa Fleming M.D.   On: 01/31/2020 21:57     Evelena Leyden, DO 02/01/2020, 6:08 AM PGY-1, [facility] FPTS Intern pager: [phone], text pages welcome

## 2020-02-01 NOTE — Progress Notes (Addendum)
Called MRI given spinal cord stimulator reported yesterday.  He will go over chart and call back to advise if patient can have MRI.  Luis Abed, D.O.  PGY-3 Family Medicine  02/01/2020 9:29 AM  Update: No stimulator noted on imaging that is available, but recommended obtaining KUB to look for it in lumbar region.  Order placed.  MRI will f/u imaging to decide if MRI can be performed.

## 2020-02-01 NOTE — Evaluation (Signed)
Speech Language Pathology Evaluation Patient Details Name: Alec Sanchez MRN: [id number] DOB: 1956-02-06 Today's Date: 02/01/2020 Time: 1100-1130 SLP Time Calculation (min) (ACUTE ONLY): 30 min  Problem List:  Patient Active Problem List   Diagnosis Date Noted  . Acute ischemic stroke (HCC) 01/31/2020   Past Medical History: No past medical history on file. HPI:  Alec Sanchez is a 64 y.o. male presenting with progressively worsening  Slurred speech and right sided weakness. PMH is significant for stroke with residual right sided weakness, HTN, HLD. UDS + for marijuana. h/o ETOH abuse.   Assessment / Plan / Recommendation Clinical Impression  Patient presents with a mild-moderate flaccid dysarthria and a mild cognitive impairment. Although son was present and daughter was present via video chat, it was difficult to determine prior cognitive functioning as patient lives alone and it became apparent during this evaluation that he was not taking care of himself and was not telling his children what was going on. (with his health, etc). Patient was fully oriented and appeared with adequate short term memory but exhibited decreased awareness to deficits, decreased self-monitoring and decreased complex level verbal problem solving. Speech intelligiblity from dysarthria was approximatley 65% at sentence and short conversational level with patient not demonstrating ability to monitor this or self-correct. Patient would benefit from brief follow up for speech strategies as well as increasing safety awareness and problem solving skills. Patient would also benefit from SLP intervention at next venue of care (home with family versus SNF)    SLP Assessment  SLP Recommendation/Assessment: Patient needs continued Speech Lanaguage Pathology Services SLP Visit Diagnosis: Dysarthria and anarthria (R47.1);Cognitive communication deficit (R41.841)    Follow Up Recommendations  Home health SLP;24 hour  supervision/assistance    Frequency and Duration min 1 x/week  1 week      SLP Evaluation Cognition  Overall Cognitive Status: Difficult to assess Arousal/Alertness: Awake/alert Orientation Level: Oriented X4 Attention: Sustained Sustained Attention: Appears intact Memory: Appears intact Awareness: Impaired Awareness Impairment: Intellectual impairment Problem Solving: Impaired Problem Solving Impairment: Verbal complex Executive Function: Self Monitoring Self Monitoring: Impaired Self Monitoring Impairment: Verbal basic       Comprehension  Auditory Comprehension Overall Auditory Comprehension: Appears within functional limits for tasks assessed    Expression Expression Primary Mode of Expression: Verbal Verbal Expression Overall Verbal Expression: Appears within functional limits for tasks assessed Written Expression Dominant Hand: Right   Oral / Motor  Oral Motor/Sensory Function Overall Oral Motor/Sensory Function: Mild impairment Facial ROM: Reduced right Facial Symmetry: Abnormal symmetry right Facial Strength: Reduced right Lingual Symmetry: Abnormal symmetry right Lingual Strength: Reduced Velum: Within Functional Limits Mandible: Within Functional Limits Motor Speech Overall Motor Speech: Impaired Respiration: Within functional limits Resonance: Within functional limits Articulation: Impaired Level of Impairment: Sentence Intelligibility: Intelligibility reduced Word: 75-100% accurate Phrase: 75-100% accurate Sentence: 50-74% accurate Conversation: 50-74% accurate Motor Planning: Witnin functional limits Motor Speech Errors: Not applicable   GO                   Angela Nevin, MA, CCC-SLP Speech Therapy [facility]

## 2020-02-02 DIAGNOSIS — I6521 Occlusion and stenosis of right carotid artery: Secondary | ICD-10-CM

## 2020-02-02 DIAGNOSIS — I639 Cerebral infarction, unspecified: Secondary | ICD-10-CM | POA: Diagnosis not present

## 2020-02-02 LAB — BASIC METABOLIC PANEL
Anion gap: 10 (ref 5–15)
BUN: 19 mg/dL (ref 8–23)
CO2: 27 mmol/L (ref 22–32)
Calcium: 9.3 mg/dL (ref 8.9–10.3)
Chloride: 101 mmol/L (ref 98–111)
Creatinine, Ser: 1.43 mg/dL — ABNORMAL HIGH (ref 0.61–1.24)
GFR, Estimated: 54 mL/min — ABNORMAL LOW (ref >60)
Glucose, Bld: 111 mg/dL — ABNORMAL HIGH (ref 70–99)
Potassium: 3.5 mmol/L (ref 3.5–5.1)
Sodium: 138 mmol/L (ref 135–145)

## 2020-02-02 LAB — SARS CORONAVIRUS 2 BY RT PCR (HOSPITAL ORDER, PERFORMED IN ~~LOC~~ HOSPITAL LAB): SARS Coronavirus 2: NEGATIVE

## 2020-02-02 MED ORDER — POLYETHYLENE GLYCOL 3350 17 G PO PACK: 17.0000 g | PACK | Freq: Every day | ORAL | Status: DC | PRN

## 2020-02-02 MED ORDER — SENNA 8.6 MG PO TABS: 1.0000 | ORAL_TABLET | Freq: Every day | ORAL | Status: DC | PRN

## 2020-02-02 NOTE — Consult Note (Addendum)
Hospital Consult    Reason for Consult: Carotid artery stenosis Requesting Physician: [facility] MRN #:  [id number]  History of Present Illness: This is a 64 y.o. male with past medical history significant for hypertension, hyperlipidemia, prediabetes, and tobacco abuse.  He is being seen in consultation for evaluation of carotid artery stenosis.  He was diagnosed with a left-sided CVA.  Work-up included CTA neck demonstrating string sign of right ICA as well as about 60% stenosis of left ICA.  Patient states he had a prior CVA in the first part of 2020.  Presenting symptoms for current CVA or right arm and hand weakness as well as slurred speech.  Plan is for discharge to rehab.  Patient states hand weakness and slurred speech is still present however has improved.  Based on chart review he is noncompliant with home medications.  During current hospitalization he has been started on aspirin, Plavix, and statin.  No past medical history on file.    No Known Allergies  Prior to Admission medications   Medication Sig Start Date End Date Taking? Authorizing Provider  amLODipine (NORVASC) 10 MG tablet Take 10 mg by mouth daily.   Yes [provider]  aspirin 325 MG EC tablet Take 325 mg by mouth daily.   Yes [provider]  atorvastatin (LIPITOR) 80 MG tablet Take 80 mg by mouth daily.   Yes [provider]  Cholecalciferol 25 MCG (1000 UT) tablet Take 1,000 Units by mouth daily.   Yes [provider]  clopidogrel (PLAVIX) 75 MG tablet Take 75 mg by mouth daily.   Yes [provider]  ferrous sulfate 325 (65 FE) MG tablet Take 325 mg by mouth daily with breakfast.   Yes [provider]  hydrochlorothiazide (HYDRODIURIL) 25 MG tablet Take 25 mg by mouth daily.   Yes [provider]  lisinopril (ZESTRIL) 10 MG tablet Take 10 mg by mouth daily.   Yes [provider]  naproxen (NAPROSYN) 500 MG tablet Take 500 mg by mouth daily as  needed for mild pain.   Yes [provider]  PARoxetine (PAXIL) 40 MG tablet Take 40 mg by mouth every morning.   Yes [provider]  prazosin (MINIPRESS) 1 MG capsule Take 1 mg by mouth at bedtime.   Yes [provider]  sildenafil (VIAGRA) 100 MG tablet Take 100 mg by mouth daily as needed for erectile dysfunction.   Yes [provider]    Social History   Socioeconomic History  . Marital status: Widowed    Spouse name: Not on file  . Number of children: Not on file  . Years of education: Not on file  . Highest education level: Not on file  Occupational History  . Not on file  Tobacco Use  . Smoking status: Not on file  Substance and Sexual Activity  . Alcohol use: Not on file  . Drug use: Not on file  . Sexual activity: Not on file  Other Topics Concern  . Not on file  Social History Narrative  . Not on file   Social Determinants of Health   Financial Resource Strain:   . Difficulty of Paying Living Expenses: Not on file  Food Insecurity:   . Worried About Programme researcher, broadcasting/film/video in the Last Year: Not on file  . Ran Out of Food in the Last Year: Not on file  Transportation Needs:   . Lack of Transportation (Medical): Not on file  . Lack of  Transportation (Non-Medical): Not on file  Physical Activity:   . Days of Exercise per Week: Not on file  . Minutes of Exercise per Session: Not on file  Stress:   . Feeling of Stress : Not on file  Social Connections:   . Frequency of Communication with Friends and Family: Not on file  . Frequency of Social Gatherings with Friends and Family: Not on file  . Attends Religious Services: Not on file  . Active Member of Clubs or Organizations: Not on file  . Attends Banker Meetings: Not on file  . Marital Status: Not on file  Intimate Partner Violence:   . Fear of Current or Ex-Partner: Not on file  . Emotionally Abused: Not on file  . Physically Abused: Not on file  . Sexually  Abused: Not on file     No family history on file.  ROS: Otherwise negative unless mentioned in HPI  Physical Examination  Vitals:   02/02/20 0835 02/02/20 1143  BP: (!) 172/78 (!) 163/91  Pulse: 66 63  Resp: 18   Temp: 97.8 F (36.6 C) 98.1 F (36.7 C)  SpO2: 96% 99%   Body mass index is 32.56 kg/m.  General:  WDWN in NAD Gait: Not observed HENT: WNL, normocephalic Pulmonary: normal non-labored breathing, without Rales, rhonchi,  wheezing Cardiac: regular Abdomen:  soft, NT/ND, no masses Skin: without rashes Vascular Exam/Pulses: Symmetrical radial pulses Extremities: without ischemic changes, without Gangrene , without cellulitis; without open wounds;  Musculoskeletal: no muscle wasting or atrophy  Neurologic: A&O X 3; grip strength asymmetrical; slurred speech Psychiatric:  The pt has Normal affect. Lymph:  Unremarkable  CBC    Component Value Date/Time   WBC 9.4 01/31/2020 1031   RBC 5.26 01/31/2020 1031   HGB 17.0 01/31/2020 1047   HCT 50.0 01/31/2020 1047   PLT 345 01/31/2020 1031   MCV 95.2 01/31/2020 1031   MCH 30.6 01/31/2020 1031   MCHC 32.1 01/31/2020 1031   RDW 13.5 01/31/2020 1031   LYMPHSABS 4.2 (H) 01/31/2020 1031   MONOABS 0.7 01/31/2020 1031   EOSABS 0.1 01/31/2020 1031   BASOSABS 0.0 01/31/2020 1031    BMET    Component Value Date/Time   NA 138 02/02/2020 0649   K 3.5 02/02/2020 0649   CL 101 02/02/2020 0649   CO2 27 02/02/2020 0649   GLUCOSE 111 (H) 02/02/2020 0649   BUN 19 02/02/2020 0649   CREATININE 1.43 (H) 02/02/2020 0649   CALCIUM 9.3 02/02/2020 0649   GFRNONAA 54 (L) 02/02/2020 0649    COAGS: Lab Results  Component Value Date   INR 1.1 01/31/2020     Non-Invasive Vascular Imaging:   CTA neck demonstrates string sign of right ICA About 60% stenosis left ICA  Carotid duplex demonstrates 80 to 99% stenosis of right ICA and 40 to 59% stenosis of left ICA  MRI shows chronic lacunar infarcts with acute left parietal  and basal ganglia infarct   ASSESSMENT/PLAN: This is a 64 y.o. male with left-sided CVA; asymptomatic bilateral carotid artery stenosis  Clinically speech and right hand weakness improving Work-up included CTA demonstrating string sign of right ICA and about 66% stenosis left ICA which is considered asymptomatic Agree with aspirin, Plavix, statin Plan will be for elective right carotid endarterectomy versus TCAR Office will schedule appointment in 3 to 4 weeks to see Dr. Whitman Hero PA-C [facility] [phone]   I have interviewed and examined patient with PA  and agree with assessment and plan above.  Carotid disease appears asymptomatic at this time however high-grade on the right.  Patient agrees to be compliant with his dual antiplatelet therapy and statin.  I will have him follow-up in a few weeks when he is out of rehab we can discuss right-sided transcarotid artery stenting versus carotid endarterectomy.  Kolten Ryback C. Randie Heinz, MD [facility] [place] Office: [phone] Pager: [phone]

## 2020-02-02 NOTE — Progress Notes (Signed)
Occupational Therapy Treatment Patient Details Name: Alec Sanchez MRN: [id number] DOB: 1955-11-09 Today's Date: 02/02/2020    History of present illness Alec Sanchez is a 64 y.o. male presenting with progressively worsening  Slurred speech and right sided weakness. (mri after session Acute left basal ganglia/corona radiata infarct.PMH is significant for stroke with residual right sided weakness, HTN, HLD. UDS + for marijuana. h/o ETOH abuse.   OT comments  Pt with improved communication this session and more awareness to need for rehab. Pt agreeable and showing OT the sheet with facility selected. Pt does have decreased awareness to fall risk with LOB on arrival and bed alarm sounding. Grandson arriving at end of session and pt was able him that he cant go home alone that he would fall. He said "went back on this bed like boom". Recommendation for SNF   Follow Up Recommendations  SNF;Supervision - Intermittent    Equipment Recommendations  Other (comment)    Recommendations for Other Services Other (comment)    Precautions / Restrictions Precautions Precautions: Fall Precaution Comments: Residual R hemi from previous stroke       Mobility Bed Mobility Overal bed mobility: Needs Assistance             General bed mobility comments: standing from EOb on arrival and falling back on bed surface on arrival with no RW  Transfers Overall transfer level: Needs assistance Equipment used: Rolling walker (2 wheeled) Transfers: Sit to/from Stand Sit to Stand: Min assist         General transfer comment: requires Ue support to push up into standing    Balance Overall balance assessment: Needs assistance Sitting-balance support: Bilateral upper extremity supported;Feet supported Sitting balance-Leahy Scale: Fair     Standing balance support: Single extremity supported;During functional activity Standing balance-Leahy Scale: Poor Standing balance comment: reliant on RW or BIL  UE support                           ADL either performed or assessed with clinical judgement   ADL Overall ADL's : Needs assistance/impaired     Grooming: Wash/dry face;Min guard;Sitting   Upper Body Bathing: Min guard;Sitting Upper Body Bathing Details (indicate cue type and reason): encouraged sitting to maximize independence and decrease fall risk Lower Body Bathing: Minimal assistance;Sit to/from stand           Toilet Transfer: Minimal assistance;RW;Regular Toilet;Grab bars Statistician Details (indicate cue type and reason): encouraged sitting to decr fallr isk Toileting- Clothing Manipulation and Hygiene: Min guard;Sitting/lateral lean       Functional mobility during ADLs: Minimal assistance;Rolling walker General ADL Comments: completed full adl at sink after bathroom transfer     Vision       Perception     Praxis      Cognition Arousal/Alertness: Awake/alert Behavior During Therapy: Flat affect Overall Cognitive Status: Impaired/Different from baseline Area of Impairment: Safety/judgement;Awareness                         Safety/Judgement: Decreased awareness of safety;Decreased awareness of deficits Awareness: Emergent   General Comments: attempting to get up to the bathroom on arrival with bed sounding without (A)         Exercises     Shoulder Instructions       General Comments pt with slurred speech this session but much better communication and better word finding    Pertinent Vitals/ Pain  Pain Assessment: No/denies pain  Home Living Family/patient expects to be discharged to:: Private residence Living Arrangements: Alone Available Help at Discharge: Family;Available PRN/intermittently Type of Home: Apartment                                  Prior Functioning/Environment              Frequency  Min 2X/week        Progress Toward Goals  OT Goals(current goals can now be  found in the care plan section)  Progress towards OT goals: Progressing toward goals  Acute Rehab OT Goals Patient Stated Goal: to go to rehab OT Goal Formulation: With patient Time For Goal Achievement: 02/15/20 Potential to Achieve Goals: Good ADL Goals Pt Will Perform Upper Body Bathing: with modified independence Pt Will Perform Lower Body Bathing: with modified independence Pt Will Transfer to Toilet: with modified independence;ambulating;regular height toilet Additional ADL Goal #1: pt will complete 5 step pathfinding task with written instruction mod i  Plan Discharge plan remains appropriate    Co-evaluation                 AM-PAC OT "6 Clicks" Daily Activity     Outcome Measure   Help from another person eating meals?: None Help from another person taking care of personal grooming?: None Help from another person toileting, which includes using toliet, bedpan, or urinal?: None Help from another person bathing (including washing, rinsing, drying)?: A Little Help from another person to put on and taking off regular upper body clothing?: None Help from another person to put on and taking off regular lower body clothing?: A Little 6 Click Score: 22    End of Session Equipment Utilized During Treatment: Rolling walker  OT Visit Diagnosis: Unsteadiness on feet (R26.81)   Activity Tolerance Patient tolerated treatment well   Patient Left     Nurse Communication Mobility status        Time: 6962-9528 OT Time Calculation (min): 37 min  Charges: OT General Charges $OT Visit: 1 Visit OT Treatments $Self Care/Home Management : 23-37 mins   Brynn, OTR/L  Acute Rehabilitation Services Pager: [phone] Office: [phone] .    Mateo Flow 02/02/2020, 4:33 PM

## 2020-02-02 NOTE — TOC Initial Note (Addendum)
Transition of Care (TOC) - Initial/Assessment Note    Patient Details  Name: Alec Sanchez MRN: [id number] Date of Birth: 09/04/1955  Transition of Care (TOC) CM/SW Contact:    Kelli F Willard, RN Phone Number: 02/02/2020, 10:34 AM  Clinical Narrative:                 CM met with the patient yesterday and he refused SNF but was agreeable to HH services.  Today he is agreeable to SNF. Pt is agreeable to being faxed out in the [place] area. FL2 completed and sent out.  TOC will provide him choice later today.  1313: pt has selected [facility]. Repeat covid test ordered. They will have a bed for him tomorrow.   Expected Discharge Plan: Skilled Nursing Facility Barriers to Discharge: Continued Medical Work up, SNF Pending bed offer   Patient Goals and CMS Choice   CMS Medicare.gov Compare Post Acute Care list provided to:: Patient Choice offered to / list presented to : Patient  Expected Discharge Plan and Services Expected Discharge Plan: Skilled Nursing Facility In-house Referral: Clinical Social Work Discharge Planning Services: CM Consult Post Acute Care Choice: Skilled Nursing Facility Living arrangements for the past 2 months: Apartment                                      Prior Living Arrangements/Services Living arrangements for the past 2 months: Apartment Lives with:: Self Patient language and need for interpreter reviewed:: Yes Do you feel safe going back to the place where you live?: Yes      Need for Family Participation in Patient Care: Yes (Comment) Care giver support system in place?: No (comment)   Criminal Activity/Legal Involvement Pertinent to Current Situation/Hospitalization: No - Comment as needed  Activities of Daily Living      Permission Sought/Granted                  Emotional Assessment Appearance:: Appears stated age Attitude/Demeanor/Rapport: Engaged Affect (typically observed): Accepting Orientation: : Oriented to Self,  Oriented to Place, Oriented to  Time, Oriented to Situation   Psych Involvement: No (comment)  Admission diagnosis:  Primary hypertension [I10] Acute ischemic stroke (HCC) [I63.9] Patient Active Problem List   Diagnosis Date Noted  . Acute ischemic stroke (HCC) 01/31/2020   PCP:  [facility] Pharmacy:   [id number] - [place], [place] - [address] AT [address] [place] [address] Phone: [phone] Fax: [phone]     Social Determinants of Health (SDOH) Interventions    Readmission Risk Interventions No flowsheet data found.  

## 2020-02-02 NOTE — Progress Notes (Signed)
Family Medicine Teaching Service Daily Progress Note Intern Pager: [phone]  Patient name: Alec Sanchez Medical record number: [id number] Date of birth: 11-06-1955 Age: 64 y.o. Gender: male  Primary Care Provider: [facility], [facility] Consultants: Neuro Code Status: Partial, DNI   Pt Overview and Major Events to Date:  12/6 - Admitted for stroke  Assessment and Plan: Alec Sanchez is a 64 y.o. male that presented with acute ischemic stroke with right sided weakness. PMH significant for tobacco use, cerebrovascular disease, HTN, HDL, marijuana use, h/o alcohol abuse.   Ischemic stroke, stable Neuro exam unchanged from prior. MRI significant for L basal ganglia/corona radiata infarct; punctate left periatrial white matter infarct; moderate small vessel disease w/chronic lacunes and microhemorrhages. CTA head and neck: no ELVO, severe atherosclerosis; R ICA string sign; L ICA 66%; moderate L M1; distal aortic arch stringy plaque with thrombus. - Neuro consulted, appreciate recs - Continue DAPT ASA 325mg  and Plavix 75mg  - PT/OT/SLP - Continue atorvastatin 80mg  - Continuous cardiac monitoring - We will speak with family to determine patient's disposition, currently stable for discharge pending safe disposition  Hypertension Current BP is 133/81, overnight patient elevated to 165/85. - Continue to monitor, will need close PCP follow-up for tight BP control.  Elevated creatinine: CKD vs AKIB Cr 1.44>1.40>1.46, baseline is unknown.  - Continue to monitor - Consider IVF  Concern for cognitive impairment - Patient will likely need discharge to SNF or home with family and home health  Pre-diabetes Patient noted to have HbA1c of 5.7.  - Encourage diet modification and exercise.  - Recommend outpatient follow-up  Tobacco use - Evaluate need for nicotine patch - Encourage cessation    FEN/GI: Heart healthy diet PPx: Lovenox   Status is: Observation  The patient remains OBS  appropriate and will d/c before 2 midnights.  Dispo: The patient is from: Home              Anticipated d/c is to: SNF or home with [facility]              Anticipated d/c date is: 1 day              Patient currently is medically stable to d/c.    Subjective:  Patient was he is doing well, he states he has had no change but feels that his right eye may be getting stronger.  He is unsure of what his disposition for home is, he states that we can contact his family to discuss this.  Objective: Temp:  [97.5 F (36.4 C)-98.3 F (36.8 C)] 98.1 F (36.7 C) (12/08 0342) Pulse Rate:  [61-89] 66 (12/08 0342) Resp:  [17-18] 18 (12/08 0342) BP: (102-165)/(81-90) 133/81 (12/08 0342) SpO2:  [94 %-99 %] 98 % (12/08 0342) Physical Exam: General: NAD, sitting up in chair next to bed Cardiovascular: Bradycardia, no m/r/g appreciated Respiratory: CTAB, normal WOB Abdomen: soft, non-tender, non-distended Neuro:    - CN II: PERRL    - CN III, IV, VI: EOMI    - CN V: normal sensation V1/V2/V3    - CN VII: symmetric smile and brown raise    - CN VIII: Normal hearing    - CN IX, X: symmetric palate raise    - CN XI: 5/5 shoulder shrug    - CN XII: tongue protrusion to right    - UE and LE strength 5/5    - Slow but no ataxia on finger to nose    Laboratory: Recent Labs  Lab  01/31/20 1031 01/31/20 1047  WBC 9.4  --   HGB 16.1 17.0  HCT 50.1 50.0  PLT 345  --    Recent Labs  Lab 01/31/20 1031 01/31/20 1047 02/01/20 0615  NA 140 140 138  K 3.7 4.1 3.5  CL 102 103 100  CO2 25  --  25  BUN CREATININE 1.44* 1.40* 1.46*  CALCIUM 9.8  --  9.7  PROT 8.3*  --   --   BILITOT 0.7  --   --   ALKPHOS 61  --   --   ALT 26  --   --   AST 19  --   --   GLUCOSE 97 96 80     Imaging/Diagnostic Tests: DG Abd 1 View  Result Date: 02/01/2020 CLINICAL DATA:  Spinal cord stimulator. EXAM: ABDOMEN - 1 VIEW COMPARISON:  None. FINDINGS: No spinal cord stimulator or other implanted device  is visible on these radiographs of the abdomen and pelvis. The spine is visualized superiorly to the T9 level on this study. Also note that no spinal cord stimulator was demonstrated on the topograms of yesterday's head and neck CTA which included the thoracic spine from the T9 level and above. Scattered stool and gas are present in nondilated colon. There is no evidence of bowel obstruction. Excreted contrast material is noted in the bladder. IMPRESSION: No spinal cord stimulator is present. Electronically Signed   By: Sebastian Ache M.D.   On: 02/01/2020 10:36   MR BRAIN WO CONTRAST  Result Date: 02/01/2020 CLINICAL DATA:  Stroke suspected. Increasing slurred speech and unsteady gait with history of prior strokes. EXAM: MRI HEAD WITHOUT CONTRAST TECHNIQUE: Multiplanar, multiecho pulse sequences of the brain and surrounding structures were obtained without intravenous contrast. COMPARISON:  Head CT and CTA 01/31/2020 FINDINGS: Brain: There is an acute left lateral lenticulostriate territory infarct involving the corona radiata and lentiform nucleus, and there are chronic blood products more inferiorly in the left lentiform nucleus which appear to be associated with a chronic lacunar infarct. A punctate acute infarct is also noted in the left periatrial white matter. There are scattered chronic cerebral microhemorrhages bilaterally including in the right basal ganglia. Patchy to confluent T2 hyperintensities in the cerebral white matter bilaterally are nonspecific but compatible with moderate chronic small vessel ischemic disease. There is a chronic lacunar infarct in the left corona radiata posterior to the acute infarct. There is mild to moderate cerebral atrophy. No mass, midline shift, or extra-axial fluid collection is identified. 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 Skull and upper cervical spine: Unremarkable bone marrow signal. Sinuses/Orbits: Unremarkable orbits. Paranasal sinuses  and mastoid air cells are clear. Other: None. IMPRESSION: 1. Acute left basal ganglia/corona radiata infarct. 2. Punctate acute left periatrial white matter infarct. 3. Moderate chronic small vessel ischemic disease with chronic lacunar infarcts and microhemorrhages. Electronically Signed   By: Sebastian Ache M.D.   On: 02/01/2020 13:28   ECHOCARDIOGRAM COMPLETE  Result Date: 02/01/2020    ECHOCARDIOGRAM REPORT   Patient Name:   Marya Amsler Delisi Date of Exam: 02/01/2020 Medical Rec #:  [id number]   Height:       67.0 in Accession #:    [id number]  Weight:       207.9 lb Date of Birth:  13-Feb-1956   BSA:          2.056 m Patient Age:    64 years    BP:  155/85 mmHg Patient Gender: M           HR:           74 bpm. Exam Location:  Inpatient Procedure: 2D Echo, Cardiac Doppler and Color Doppler Indications:    Stroke 434.91 / I163.9  History:        Patient has no prior history of Echocardiogram examinations.  Sonographer:    Renella Cunas RDCS Referring Phys: [phone] BRIAN MILLER IMPRESSIONS  1. Left ventricular ejection fraction, by estimation, is 55 to 60%. The left ventricle has normal function. The left ventricle has no regional wall motion abnormalities. Left ventricular diastolic parameters are consistent with Grade I diastolic dysfunction (impaired relaxation).  2. Right ventricular systolic function is normal. The right ventricular size is normal.  3. The mitral valve is normal in structure. Trivial mitral valve regurgitation. No evidence of mitral stenosis.  4. The aortic valve is normal in structure. Aortic valve regurgitation is not visualized. Mild aortic valve sclerosis is present, with no evidence of aortic valve stenosis.  5. The inferior vena cava is normal in size with greater than 50% respiratory variability, suggesting right atrial pressure of 3 mmHg. FINDINGS  Left Ventricle: Left ventricular ejection fraction, by estimation, is 55 to 60%. The left ventricle has normal function. The left ventricle  has no regional wall motion abnormalities. The left ventricular internal cavity size was normal in size. There is  no left ventricular hypertrophy. Left ventricular diastolic parameters are consistent with Grade I diastolic dysfunction (impaired relaxation). Normal left ventricular filling pressure. Right Ventricle: The right ventricular size is normal. No increase in right ventricular wall thickness. Right ventricular systolic function is normal. Left Atrium: Left atrial size was normal in size. Right Atrium: Right atrial size was normal in size. Pericardium: There is no evidence of pericardial effusion. Mitral Valve: The mitral valve is normal in structure. Mild mitral annular calcification. Trivial mitral valve regurgitation. No evidence of mitral valve stenosis. Tricuspid Valve: The tricuspid valve is normal in structure. Tricuspid valve regurgitation is not demonstrated. No evidence of tricuspid stenosis. Aortic Valve: The aortic valve is normal in structure. Aortic valve regurgitation is not visualized. Mild aortic valve sclerosis is present, with no evidence of aortic valve stenosis. Pulmonic Valve: The pulmonic valve was normal in structure. Pulmonic valve regurgitation is not visualized. No evidence of pulmonic stenosis. Aorta: The aortic root is normal in size and structure. Venous: The inferior vena cava is normal in size with greater than 50% respiratory variability, suggesting right atrial pressure of 3 mmHg. IAS/Shunts: No atrial level shunt detected by color flow Doppler.  LEFT VENTRICLE PLAX 2D LVIDd:         3.10 cm      Diastology LVIDs:         2.40 cm      LV e' medial:    4.14 cm/s LV PW:         1.00 cm      LV E/e' medial:  9.7 LV IVS:        1.00 cm      LV e' lateral:   6.16 cm/s LVOT diam:     2.30 cm      LV E/e' lateral: 6.5 LV SV:         67 LV SV Index:   33 LVOT Area:     4.15 cm  LV Volumes (MOD) LV vol d, MOD A2C: 101.0 ml LV vol d, MOD A4C: 91.5 ml LV vol  s, MOD A2C: 51.7 ml LV vol  s, MOD A4C: 47.8 ml LV SV MOD A2C:     49.3 ml LV SV MOD A4C:     91.5 ml LV SV MOD BP:      49.6 ml RIGHT VENTRICLE RV S prime:     9.64 cm/s TAPSE (M-mode): 1.9 cm LEFT ATRIUM             Index       RIGHT ATRIUM           Index LA diam:        3.00 cm 1.46 cm/m  RA Area:     12.40 cm LA Vol (A2C):   22.4 ml 10.90 ml/m RA Volume:   26.50 ml  12.89 ml/m LA Vol (A4C):   23.1 ml 11.24 ml/m LA Biplane Vol: 24.3 ml 11.82 ml/m  AORTIC VALVE LVOT Vmax:   105.00 cm/s LVOT Vmean:  58.900 cm/s LVOT VTI:    0.161 m  AORTA Ao Root diam: 3.20 cm MITRAL VALVE MV Area (PHT): 2.20 cm    SHUNTS MV Decel Time: 345 msec    Systemic VTI:  0.16 m MV E velocity: 40.10 cm/s  Systemic Diam: 2.30 cm MV A velocity: 57.50 cm/s MV E/A ratio:  0.70 Mihai Croitoru MD Electronically signed by Thurmon FairMihai Croitoru MD Signature Date/Time: 02/01/2020/10:29:16 AM    Final    VAS US CAROTID  Result Date: 02/01/2020 Carotid Arterial Duplex Study Indications:                            CVA. Comparison Study:                       No prior study Pre-Surgical Evaluation & Surgical      Stenosis at RICA only. ICA is normal Correlation                             past the stenosis. Anatomy on the right                                         is within normal limits.Right                                         bifurcation is located near the                                         mandible. Performing Technologist: Gertie FeyMichelle Simonetti MHA, RDMS, RVT, RDCS  Examination Guidelines: A complete evaluation includes B-mode imaging, spectral Doppler, color Doppler, and power Doppler as needed of all accessible portions of each vessel. Bilateral testing is considered an integral part of a complete examination. Limited examinations for reoccurring indications may be performed as noted.  Right Carotid Findings: +----------+--------+--------+--------+-----------------------+--------+           PSV cm/sEDV cm/sStenosisPlaque Description     Comments  +----------+--------+--------+--------+-----------------------+--------+ CCA Prox  104     12              smooth and heterogenous         +----------+--------+--------+--------+-----------------------+--------+  CCA Distal62      13              smooth and heterogenous         +----------+--------+--------+--------+-----------------------+--------+ ICA Prox  538     232     80-99%  smooth and homogeneous          +----------+--------+--------+--------+-----------------------+--------+ ICA Mid   202     56                                              +----------+--------+--------+--------+-----------------------+--------+ ICA Distal24      12                                              +----------+--------+--------+--------+-----------------------+--------+ ECA       168     18              smooth and heterogenous         +----------+--------+--------+--------+-----------------------+--------+ +----------+--------+-------+----------------+-------------------+           PSV cm/sEDV cmsDescribe        Arm Pressure (mmHG) +----------+--------+-------+----------------+-------------------+ Subclavian89             Multiphasic, WNL                    +----------+--------+-------+----------------+-------------------+ +---------+--------+--+--------+-+---------+ VertebralPSV cm/s10EDV cm/s3Antegrade +---------+--------+--+--------+-+---------+  Left Carotid Findings: +----------+-------+-------+--------+---------------------------------+--------+           PSV    EDV    StenosisPlaque Description               Comments           cm/s   cm/s                                                     +----------+-------+-------+--------+---------------------------------+--------+ CCA Prox  126    23                                                       +----------+-------+-------+--------+---------------------------------+--------+ CCA Distal72      14             smooth, heterogenous and calcific         +----------+-------+-------+--------+---------------------------------+--------+ ICA Prox  175    73     40-59%  heterogenous, irregular and                                               calcific                                  +----------+-------+-------+--------+---------------------------------+--------+ ICA Mid   145    46                                                       +----------+-------+-------+--------+---------------------------------+--------+  ICA Distal87     24                                                       +----------+-------+-------+--------+---------------------------------+--------+ ECA       106    13                                                       +----------+-------+-------+--------+---------------------------------+--------+ +----------+--------+--------+----------------+-------------------+           PSV cm/sEDV cm/sDescribe        Arm Pressure (mmHG) +----------+--------+--------+----------------+-------------------+ [id number]              Multiphasic, WNL                    +----------+--------+--------+----------------+-------------------+ +---------+--------+--+--------+--+---------+ VertebralPSV cm/s63EDV cm/s18Antegrade +---------+--------+--+--------+--+---------+   Summary: Right 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 Left Carotid: Velocities in the left ICA are consistent with an upper range               40-59% stenosis. Vertebrals:  Bilateral vertebral arteries demonstrate antegrade flow. Subclavians: Normal flow hemodynamics were seen in bilateral subclavian              arteries. *See table(s) above for measurements and observations.  Electronically signed by Delia Heady MD on 02/01/2020 at 12:32:16 PM.    Final      Evelena Leyden, DO 02/02/2020, 6:07 AM PGY-1, [facility] Family  Medicine FPTS Intern pager: [phone], text pages welcome

## 2020-02-02 NOTE — Progress Notes (Signed)
Pt is medically stable for discharge to SNF when bed is available.  Towanda Octave MD PGY2, Family Medicine

## 2020-02-02 NOTE — NC FL2 (Signed)
  [place] MEDICAID [id number] LEVEL OF CARE SCREENING TOOL     IDENTIFICATION  Patient Name: Alec Sanchez Birthdate: 1956/01/13 Sex: male Admission Date (Current Location): 01/31/2020  [facility] and [place] Number:  Producer, television/film/video and Address:  [facility], [address] [place], [place] [address]      Provider Number: [id number]  Attending Physician Name and Address:  Westley Chandler, MD  Relative Name and Phone Number:       Current Level of Care: Hospital Recommended Level of Care: Skilled Nursing Facility Prior Approval Number:    Date Approved/Denied:   PASRR Number: [id number]  Discharge Plan: SNF    Current Diagnoses: Patient Active Problem List   Diagnosis Date Noted  . Acute ischemic stroke (HCC) 01/31/2020    Orientation RESPIRATION BLADDER Height & Weight     Self, Time, Situation, Place  Normal Continent Weight: 94.3 kg Height:  5\' 7"  (170.2 cm)  BEHAVIORAL SYMPTOMS/MOOD NEUROLOGICAL BOWEL NUTRITION STATUS      Continent Diet (heart healthy with thin liquids)  AMBULATORY STATUS COMMUNICATION OF NEEDS Skin   Limited Assist Verbally Normal                       Personal Care Assistance Level of Assistance  Bathing, Feeding, Dressing Bathing Assistance: Limited assistance Feeding assistance: Independent Dressing Assistance: Limited assistance     Functional Limitations Info  Sight, Hearing, Speech Sight Info: Adequate Hearing Info: Adequate Speech Info: Impaired    SPECIAL CARE FACTORS FREQUENCY  PT (By licensed PT), OT (By licensed OT), Speech therapy     PT Frequency: 5x/wk OT Frequency: 5x/wk     Speech Therapy Frequency: 5x/wk      Contractures Contractures Info: Not present    Additional Factors Info  Code Status, Allergies Code Status Info: LCB Allergies Info: NKA           Current Medications (02/02/2020):  This is the current hospital active medication list Current Facility-Administered  Medications  Medication Dose Route Frequency Provider Last Rate Last Admin  . acetaminophen (TYLENOL) tablet 650 mg  650 mg Oral Q4H PRN Lilland, Alana, DO       Or  . acetaminophen (TYLENOL) 160 MG/5ML solution 650 mg  650 mg Per Tube Q4H PRN Lilland, Alana, DO       Or  . acetaminophen (TYLENOL) suppository 650 mg  650 mg Rectal Q4H PRN Lilland, Alana, DO      . aspirin EC tablet 325 mg  325 mg Oral Daily 14/09/2019, MD   325 mg at 02/01/20 1102  . atorvastatin (LIPITOR) tablet 80 mg  80 mg Oral Daily Lilland, Alana, DO   80 mg at 02/01/20 1102  . clopidogrel (PLAVIX) tablet 75 mg  75 mg Oral Daily 14/07/21, MD   75 mg at 02/01/20 1102  . enoxaparin (LOVENOX) injection 40 mg  40 mg Subcutaneous Q24H Lilland, Alana, DO   40 mg at 02/01/20 2144     Discharge Medications: Please see discharge summary for a list of discharge medications.  Relevant Imaging Results:  Relevant Lab Results:   Additional Information SS#: [id number] has had both Moderna shots (last was in April)  May, RN

## 2020-02-02 NOTE — Progress Notes (Signed)
Physical Therapy Treatment Patient Details Name: Alec Sanchez MRN: [id number] DOB: [birth year] Today's Date: 02/02/2020    History of Present Illness Alec Sanchez is a 64 y.o. male presenting with progressively worsening  Slurred speech and right sided weakness. (mri after session Acute left basal ganglia/corona radiata infarct.PMH is significant for stroke with residual right sided weakness, HTN, HLD. UDS + for marijuana. h/o ETOH abuse.    PT Comments    Pt is making progress towards his goals. Focused session on gait training as pt displays tendency to ambulate with RW distal to body or drift to R side of RW, requiring cues to acknowledge and correct his body placement for safety. He displays decreased step length on his R leg compared to his L, which improved with cues and concentration, but returned to prior status when pt would become distracted with conversation. No overt LOB this date, but min guard assist to come to stand and intermittent minA to manage RW during ~210 ft gait bout. Will continue to follow acutely. Current recommendations remain appropriate.    Follow Up Recommendations  Home health PT;Supervision/Assistance - 24 hour     Equipment Recommendations  Rolling walker with 5" wheels    Recommendations for Other Services       Precautions / Restrictions Precautions Precautions: Fall Precaution Comments: Residual R hemi from previous stroke Restrictions Weight Bearing Restrictions: No    Mobility  Bed Mobility Overal bed mobility: Needs Assistance Bed Mobility: Supine to Sit     Supine to sit: Supervision     General bed mobility comments: Pt able to come to sit EOB with use of bed rails with extra time and supervision for safety.  Transfers Overall transfer level: Needs assistance Equipment used: Rolling walker (2 wheeled) Transfers: Sit to/from Stand Sit to Stand: Min guard         General transfer comment: Cues for hand placement and min guard and  extra time to power up to stand safely, no overt LOB.  Ambulation/Gait Ambulation/Gait assistance: Min guard;Min assist Gait Distance (Feet): 210 Feet Assistive device: Rolling walker (2 wheeled) Gait Pattern/deviations: Step-through pattern;Decreased stride length;Decreased step length - right;Narrow base of support;Drifts right/left Gait velocity: decresaed Gait velocity interpretation: <1.31 ft/sec, indicative of household ambulator General Gait Details: Pt tends to drift to R side of RW and requires cues to acknowledge and correct. Provided visual and verbal cues to increase bilat step length, esp on R to make more symmetrical, and perform heel-to-toe sequence. Increased concentration to follow cues with him returning to decreased step length on R when distracted with conversation. No overt LOB, but intermittent minA to manage RW.   Stairs             Wheelchair Mobility    Modified Rankin (Stroke Patients Only) Modified Rankin (Stroke Patients Only) Pre-Morbid Rankin Score: Slight disability Modified Rankin: Moderately severe disability     Balance Overall balance assessment: Needs assistance Sitting-balance support: Bilateral upper extremity supported;Feet supported Sitting balance-Leahy Scale: Fair Sitting balance - Comments: Sitting EOB with supervision.   Standing balance support: Bilateral upper extremity supported;During functional activity Standing balance-Leahy Scale: Poor Standing balance comment: reliant on RW or BIL UE support                            Cognition Arousal/Alertness: Awake/alert Behavior During Therapy: WFL for tasks assessed/performed Overall Cognitive Status: Impaired/Different from baseline Area of Impairment: Safety/judgement;Awareness  Safety/Judgement: Decreased awareness of safety;Decreased awareness of deficits Awareness: Emergent   General Comments: Pt requires cues to acknowledge his  body placement within his RW and to correct it. Cues required to manage RW to maintain safety.      Exercises      General Comments General comments (skin integrity, edema, etc.): pt with slurred speech this session but much better communication and better word finding      Pertinent Vitals/Pain Pain Assessment: No/denies pain    Home Living Family/patient expects to be discharged to:: Private residence Living Arrangements: Alone Available Help at Discharge: Family;Available PRN/intermittently Type of Home: Apartment              Prior Function            PT Goals (current goals can now be found in the care plan section) Acute Rehab PT Goals Patient Stated Goal: to improve PT Goal Formulation: With patient Time For Goal Achievement: 02/15/20 Potential to Achieve Goals: Good Progress towards PT goals: Progressing toward goals    Frequency    Min 4X/week      PT Plan Current plan remains appropriate    Co-evaluation              AM-PAC PT "6 Clicks" Mobility   Outcome Measure  Help needed turning from your back to your side while in a flat bed without using bedrails?: A Little Help needed moving from lying on your back to sitting on the side of a flat bed without using bedrails?: A Little Help needed moving to and from a bed to a chair (including a wheelchair)?: A Little Help needed standing up from a chair using your arms (e.g., wheelchair or bedside chair)?: A Little Help needed to walk in hospital room?: A Little Help needed climbing 3-5 steps with a railing? : A Lot 6 Click Score: 17    End of Session Equipment Utilized During Treatment: Gait belt Activity Tolerance: Patient tolerated treatment well Patient left: in chair;with call bell/phone within reach;with chair alarm set   PT Visit Diagnosis: Unsteadiness on feet (R26.81);Difficulty in walking, not elsewhere classified (R26.2);Other abnormalities of gait and mobility (R26.89)     Time:  0258-5277 PT Time Calculation (min) (ACUTE ONLY): 15 min  Charges:  $Gait Training: 8-22 mins                     Alec Sanchez, PT, DPT Acute Rehabilitation Services  Pager: [phone] Office: [phone]    Alec Sanchez 02/02/2020, 5:08 PM

## 2020-02-02 NOTE — Progress Notes (Signed)
STROKE TEAM PROGRESS NOTE   INTERVAL HISTORY Patient states is doing better.  His dysarthria is improved.  He states now he is going to be compliant with his medications and medical follow-up.  Vitals:   02/01/20 2006 02/01/20 2352 02/02/20 0342 02/02/20 0835  BP: (!) 165/85 (!) 145/88 133/81 (!) 172/78  Pulse: 72 61 66 66  Resp: 18 17 18 18   Temp: 98 F (36.7 C) 98.3 F (36.8 C) 98.1 F (36.7 C) 97.8 F (36.6 C)  TempSrc: Oral Oral Oral Oral  SpO2: 94% 98% 98% 96%  Weight:      Height:       CBC:  Recent Labs  Lab 01/31/20 1031 01/31/20 1047  WBC 9.4  --   NEUTROABS 4.3  --   HGB 16.1 17.0  HCT 50.1 50.0  MCV 95.2  --   PLT 345  --    Basic Metabolic Panel:  Recent Labs  Lab 02/01/20 0615 02/02/20 0649  NA 138 138  K 3.5 3.5  CL 100 101  CO2 25 27  GLUCOSE 80 111*  BUN 15 19  CREATININE 1.46* 1.43*  CALCIUM 9.7 9.3   Lipid Panel:  Recent Labs  Lab 02/01/20 0246  CHOL 203*  TRIG 138  HDL 30*  CHOLHDL 6.8  VLDL 28  LDLCALC 14/07/21*   HgbA1c:  Recent Labs  Lab 02/01/20 0246  HGBA1C 5.7*   Urine Drug Screen:  Recent Labs  Lab 01/31/20 1120  LABOPIA NONE DETECTED  COCAINSCRNUR NONE DETECTED  LABBENZ NONE DETECTED  AMPHETMU NONE DETECTED  THCU POSITIVE*  LABBARB NONE DETECTED    Alcohol Level  Recent Labs  Lab 01/31/20 2026  ETH <10    IMAGING past 24 hours MR BRAIN WO CONTRAST  Result Date: 02/01/2020 CLINICAL DATA:  Stroke suspected. Increasing slurred speech and unsteady gait with history of prior strokes. EXAM: MRI HEAD WITHOUT CONTRAST TECHNIQUE: Multiplanar, multiecho pulse sequences of the brain and surrounding structures were obtained without intravenous contrast. COMPARISON:  Head CT and CTA 01/31/2020 FINDINGS: Brain: There is an acute left lateral lenticulostriate territory infarct involving the corona radiata and lentiform nucleus, and there are chronic blood products more inferiorly in the left lentiform nucleus which appear to  be associated with a chronic lacunar infarct. A punctate acute infarct is also noted in the left periatrial white matter. There are scattered chronic cerebral microhemorrhages bilaterally including in the right basal ganglia. Patchy to confluent T2 hyperintensities in the cerebral white matter bilaterally are nonspecific but compatible with moderate chronic small vessel ischemic disease. There is a chronic lacunar infarct in the left corona radiata posterior to the acute infarct. There is mild to moderate cerebral atrophy. No mass, midline shift, or extra-axial fluid collection is identified. 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 Skull and upper cervical spine: Unremarkable bone marrow signal. Sinuses/Orbits: Unremarkable orbits. Paranasal sinuses and mastoid air cells are clear. Other: None. IMPRESSION: 1. Acute left basal ganglia/corona radiata infarct. 2. Punctate acute left periatrial white matter infarct. 3. Moderate chronic small vessel ischemic disease with chronic lacunar infarcts and microhemorrhages. Electronically Signed   By: 14/07/2019 M.D.   On: 02/01/2020 13:28    PHYSICAL EXAM   Middle-aged African-American male not in distress. . Afebrile. Head is nontraumatic. Neck is supple without bruit.    Cardiac exam no murmur or gallop. Lungs are clear to auscultation. Distal pulses are well felt. Neurological Exam : Patient is awake alert oriented time place and  person.  No aphasia apraxia or dysarthria.  Extraocular movements are full range without nystagmus.  Blinks to threat bilaterally.  Mild right lower facial asymmetry when he smiles.  Tongue midline.  Mild right hemiparesis but effort is poor for isolated muscle testing.  Fine finger movements are diminished on the right compared to the left orbits left over right upper extremity.  Sensation appears preserved bilaterally.  Coordination is slow but accurate.  Gait not tested.   ASSESSMENT/PLAN Alec Sanchez is a 64  y.o. male with history of stroke, tobacco use and medical non compliance presenting with slurred speech x 3 days. He had stopped taking meds prior to admission.   Stroke:  L basal ganglia and L white matter infarcts secondary to small vessel disease .asymptomatic near occlusive right carotid stenosis with string sign   CT head L corona radiata hypodensity. Small vessel disease. Atrophy. Trace stranding occipital soft tissues.   CTA head & neck no ELVO. 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 Severe atherosclerosis:  R ICA string sign; L ICA 66%; moderate L M1. Distal aortic arch stringy plaque w/ thrombus. Moderate to moderate intracranial branch irregularity.   MRI  L basal ganglia / corona radiata infarct. Punctate L periatrial white matter infarct. Moderate small vessel disease w/ chronic lacunes and microhemorrhages.  Carotid Doppler  R ICA 80-99%, L ICA 40-59%  2D Echo EF 55-60%. No source of embolus   LDL 145  HgbA1c 5.7  VTE prophylaxis - Lovenox 40 mg sq daily   none (had stopped taking prescribed aspirin and plavix) prior to admission, now on aspirin 325 mg daily and clopidogrel 75 mg daily.   Therapy recommendations:  SNF recommended  Disposition: SNF - awaiting bed availability  Bilateral Carotid Stenosis   Carotid Doppler  R ICA 80-99%, L ICA 40-59%   CTA neck R ICA string sign; L ICA 66%. Distal aortic arch stringy plaque w/ thrombus.  Not the etiology of current stroke  Pt noncomplaint with all meds PTA  Discussed surgical intervention, consult "I am not doing any surgery!" discussed w/ pt, family member at bedside and daughter on phone. They will discuss further  Can do as OP if he is agreeable  Aortic Arch Thromus  CTA neck Distal aortic arch stringy plaque w/ thrombus.  Pt noncompliant w/ meds  Not a good AC candidate  Hypertension  Stable . Permissive hypertension (OK if < 220/120) but gradually normalize in 5-7 days . Long-term BP  goal normotensive  Hyperlipidemia  Home meds:  Prescribed lipitor 80 but had stopped taking  Resumed lipitor 80 in hospital  LDL 145, goal < 70  Continue statin at discharge  Prediabetes  HgbA1c 5.7  Other Stroke Risk Factors  Advanced Age >/= 58   Cigarette smoker, advised to stop smoking  Concern for alcohol use  Substance abuse - UDS:  THC POSITIVE. Patient advised to stop using due to stroke risk.  Obesity, Body mass index is 32.56 kg/m., BMI >/= 30 associated with increased stroke risk, recommend weight loss, diet and exercise as appropriate   Hx stroke/TIA - details not available   Family hx stroke (father)  Other Active Problems  Abnormal EKG  CKD vs AKI - creatinine 1.43  Concern for cognitive impairment  Hospital day # 1 Recommend aspirin 81 mg and Plavix 75 mg daily for 3 months given high-grade carotid stenosis string sign followed by aspirin alone.  Patient counseled to quit smoking cigarettes.  Aggressive risk factor  modification.  Vascular surgery consult for elective right carotid revascularization.  Stroke team will sign off.  Kindly call for questions. Delia Heady, MD  To contact Stroke Continuity provider, please refer to [email or web address]. After hours, contact General Neurology

## 2020-02-03 MED ORDER — POLYETHYLENE GLYCOL 3350 17 G PO PACK: 17.0000 g | PACK | Freq: Every day | ORAL | Status: DC | PRN

## 2020-02-03 MED ORDER — ASPIRIN EC 81 MG PO TBEC: 81.0000 mg | DELAYED_RELEASE_TABLET | Freq: Every day | ORAL | Status: DC

## 2020-02-03 MED ORDER — ASPIRIN 81 MG PO TBEC
81.0000 mg | DELAYED_RELEASE_TABLET | Freq: Every day | ORAL | 11 refills | Status: DC
Start: 2020-02-04 — End: 2020-12-06

## 2020-02-03 NOTE — Progress Notes (Signed)
  Speech Language Pathology Treatment:    Patient Details Name: Alec Sanchez MRN: [id number] DOB: [birth year] Today's Date: 02/03/2020 Time: 0122-0130 SLP Time Calculation (min) (ACUTE ONLY): 8 min  Assessment / Plan / Recommendation Clinical Impression  SLP followed up for cognitive linguistic intervention. Pt notes speech has continued to improve since admission, however deficits persist in articulation at times impairing communication intelligibility. SLP reinforced compensatory speaking strategies including slowed rate, over articulation, pausing, and increasing vocal intensity. Pt completed verbal tasks with use of strategies with 75 percent accurancy. Further ST intervention can be continued at next level of care. Pt with plans to DC to SNF.    HPI HPI: Alec Sanchez is a 64 y.o. male presenting with progressively worsening  Slurred speech and right sided weakness. PMH is significant for stroke with residual right sided weakness, HTN, HLD. UDS + for marijuana. h/o ETOH abuse.      SLP Plan   (further needs can be addressed at next level of care)       Recommendations    Continuation of compensatory speaking strategies and cognitive therapy                Oral Care Recommendations: Oral care BID Follow up Recommendations: Skilled Nursing facility SLP Visit Diagnosis: Dysarthria and anarthria (R47.1);Cognitive communication deficit (R41.841) Plan:  (further needs can be addressed at next level of care)       GO                Khyra Viscuso E Corderro Koloski MA, CCC-SLP Acute Rehabilitation Services 02/03/2020, 1:35 PM

## 2020-02-03 NOTE — Plan of Care (Signed)
Education completed, patient being discharged.

## 2020-02-03 NOTE — Discharge Summary (Addendum)
Family Medicine Teaching [facility] Discharge Summary  Patient name: Alec Sanchez Medical record number: [id number] Date of birth: Mar 29, 1955 Age: 64 y.o. Gender: male Date of Admission: 01/31/2020  Date of Discharge: 02/03/2020 Admitting Physician: Westley Chandler, MD  Primary Care Provider: [facility] Consultants: Neurology, Vascular surgery  Indication for Hospitalization: Slurred speech and right sided weakness  Discharge Diagnoses/Problem List:  Ischemic stroke with right sided weakness Bradycardia Elevated creatinine Hypertension Marijuana use Tobacco abuse History of alcohol abuse  Disposition: SNF  Discharge Condition: Stable, improved  Discharge Exam:  Gen: NAD, supine in bed, pleasant CV: Bradycardia, no m/r/g appreciated, no peripheral edema Pulm: CTAB, normal WOB Abd: soft, non-tender, non-distended, bowel sounds present Neuro:    - CN II: PERRL    - CN III, IV, VI: EOMI    - CN V: normal sensation V1/V2/V3    - CN VII: symmetric smile and brown raise    - CN VIII: Normal hearing    - CN IX, X: symmetric palate raise    - CN XI: 5/5 shoulder shrug    - CN XII: tongue protrusion to right    - UE and LE strength 5/5    - Slow but no ataxia on finger to nose   Brief Hospital Course:  Alec Sanchez is a 64 y.o. male that presented with acute ischemic stroke with right sided weakness. PMH significant for tobacco use, cerebrovascular disease, HTN, HDL, marijuana use, h/o alcohol abuse.  Ischemic stroke with right-sided weakness Patient presented with right-sided weakness and slurred speech x3 days. In the ED patient received 1 dose ASA 325 and neurology was consulted.  Neuro exam notable for mild right lower facial asymmetry when smiling, tongue deviation to the right, mild right hemiparesis, left by lateral deviation with accommodation. Neurology was consulted. CT head notable for corona radiata hypodensity, small vessel disease, atrophy.  CTA head  and neck showed severe atherosclerosis, right ICA string sign.  MRI showed left basal ganglia/corona radiata infarct, punctate left periatrial white matter infarct, moderate small vessel disease, chronic lacunes and microhemorrhages. Carotid Doppler showed right ICA 80-99%, left ICA 40-59%.  Echo showed EF of 55-60%. Lipid panel showed LDL of 145, HbA1c 5.7. Patient was restarted on aspirin 81 mg and Plavix 75 mg daily-this should be continued for at least 3 months.  Bilateral carotid stenosis Carotid Doppler showed right ICA 80-99%, left ICA 40-59%. Reviewed showed right ICA string sign and left ICA 66%. Patient seen by vascular surgery in the hospital and will follow-up outpatient after rehab to discuss right-sided transcarotid artery stenting vs carotid endarterectomy.   Abnormal ECG Patient noted to have bradycardia. At night, patient noted to have a 2.3-second pause that was asymptomatic. Suspected component of OSA. Patient asymptomatic during episode and entire hospitalization.  Hypertension Permissive hypertension during hospitalization due to recent stroke with parameters of intervention if > 220/120. Patient required no further intervention, would recommend follow-up outpatient to start back regular blood pressure medications.  Hyperlipidemia Patient has a history of hyperlipidemia; LDL in the hospital was 145. Goal LDL is <70. Patient discharged with atorvastatin 80 mg daily.  Elevated creatinine: CKD versus AKI Patient did have elevated creatinine on admitted, could not determine if this was the patient's baseline creatinine level or signs of an acute kidney injury. Patient creatinine remained stable during admission at around 1.4.  Prediabetes HbA1c elevated at 5.7, recommend outpatient follow-up and diet modification/exercise.   Discharge follow-up recommendations:  -Consider adding Zetia for tighter cholesterol  control  - Emphasize importance of medication compliance - Monitor  A1c. A1c 5.7 in hospital. Recommend diet modifications and exercise. 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 -Outpatient Vascular surgery follow up with Dr. Turner Daniels in 3-4 weeks for elective right transcarotid artery stenting vs carotid endarterectomy.  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   - BP meds were held on admission in setting of acute CVA for permissive HTN.  Patient was restarted on norvasc 10mg  at discharge.  Restart HCTZ and lisinopril as able. - Prazosin was held on discharge, patient unsure of indication for use.  It was held in setting of permissive HTN.  Please restart as indicated. - Patient indicated he was not taking Paroxetine for several weeks prior to admission and it was not continued on admission. Consider using a different SSRI.     Significant Procedures: None  Significant Labs and Imaging:  Recent Labs  Lab 01/31/20 1031 01/31/20 1047  WBC 9.4  --   HGB 16.1 17.0  HCT 50.1 50.0  PLT 345  --    Recent Labs  Lab 01/31/20 1031 01/31/20 1047 02/01/20 0615 02/02/20 0649  NA 140 140 138 138  K 3.7 4.1 3.5 3.5  CL 102 103 100 101  CO2 25  --  25 27  GLUCOSE 97 96 80 111*  BUN 14 20 15 19   CREATININE 1.44* 1.40* 1.46* 1.43*  CALCIUM 9.8  --  9.7 9.3  ALKPHOS 61  --   --   --   AST 19  --   --   --   ALT 26  --   --   --   ALBUMIN 4.1  --   --   --     CT ANGIO HEAD W OR WO CONTRAST  Result Date: 01/31/2020 CLINICAL DATA:  64 year old male with difficulty walking and slurred speech x1 week. Age indeterminate small vessel disease on plain CT today. EXAM: CT ANGIOGRAPHY HEAD AND NECK TECHNIQUE: Multidetector CT imaging of the head and neck was performed using the standard protocol during  bolus administration of intravenous contrast. Multiplanar CT image reconstructions and MIPs were obtained to evaluate the vascular anatomy. Carotid stenosis measurements (when applicable) are obtained utilizing NASCET criteria, using the distal internal carotid diameter as the denominator. CONTRAST:  26mL OMNIPAQUE IOHEXOL 350 MG/ML SOLN COMPARISON:  Head CT without contrast 1046 hours today. FINDINGS: CTA NECK Skeleton: Absent dentition. Lower cervical spine disc and endplate degeneration. No acute osseous abnormality identified. Upper chest: Negative. Other neck: No acute finding. Aortic arch: 3 vessel arch configuration with abundant soft mural plaque or thrombus throughout the arch (series 5, image 171) including a small stringy area of plaque or thrombus as demonstrated on series 9, image 145. Right carotid system: Mild to moderate brachiocephalic artery plaque without stenosis. Negative right CCA origin. Low-density plaque along the medial and anterior left CCA (series 5, image 113) without stenosis to the bifurcation. Bulky mostly soft plaque or thrombus at the right ICA bulb results in high-grade stenosis approaching a radiographic string sign is seen on series 9, image 107. Stenosis continues along a segment of about 12 mm but the vessel remains patent to the skull base. Left carotid system: Mild plaque at the left  CCA origin without stenosis. Medial low-density plaque in the left CCA similar to the right side without stenosis (series 5, image 126). At the left carotid bifurcation complex soft and calcified plaque appears partially ulcerated and results in maximal stenosis at the distal bulb (see series 8, image 135) numerically estimated at 66 % with respect to the distal vessel, but might be underestimated (series 7, image 184). The vessel remains patent to the skull base. Vertebral arteries: Mild to moderate soft plaque in the proximal right subclavian artery without stenosis, but the right vertebral  artery is occluded from its origin to the C2 level, with only faint reconstitution at the C1-C2 level, and additional moderate to severe stenosis in the distal V3 segment on series 7, image 157. Soft plaque in the proximal left subclavian artery does not result in significant stenosis. Left vertebral artery origin is patent. There is mild to moderate stenosis in the left V1 segment due to soft plaque (series 8, image 165). Beyond that the left vertebral artery appears dominant and is widely patent to the skull base. CTA HEAD Posterior circulation: Better enhancement of the non dominant right V4 segment with patent right PICA origin. Dominant left V4 segment is patent to the basilar with mild irregularity and no stenosis. Normal left PICA origin. Patent basilar artery with mild irregularity, no significant stenosis. Patent SCA and PCA origins. Posterior communicating arteries are diminutive or absent. Mild bilateral PCA branch irregularity including in the left P2 segment (series 12, image 33). Anterior circulation: Both ICA siphons are patent. Combined soft and calcified plaque on the left results in widespread mild to moderate left siphon irregularity and mild to moderate cavernous segment stenosis on series 9, image 157. Less pronounced right siphon plaque until the supraclinoid segment where there is mild to moderate stenosis due to calcified atherosclerosis. Patent carotid termini, MCA and ACA origins. Dominant left A1 segment (series 11, image 20). Anterior communicating artery is normal. There is mild bilateral ACA branch irregularity (series 12, image 30). Left MCA M1 segment demonstrates mild to moderate irregularity and stenosis as seen on series 10, image 20. Left MCA trifurcation remains patent. No left MCA branch occlusion identified. Mild to moderate left M2 and M3 branch irregularity (series 10, image 20). Right MCA M1 segment and bifurcation are patent with only mild irregularity. Mild right MCA  branch irregularity otherwise. Venous sinuses: Early contrast timing but grossly patent major venous structures. Anatomic variants: Dominant left vertebral V4 segment. Review of the MIP images confirms the above findings IMPRESSION: 1. 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 Probable retrograde supply to the right V4 segment, with moderate to severe stenosis of the vessel at the skull base. 2. Severe atherosclerosis in the head and neck with predominantly soft plaque. Significant stenoses include: - bulky soft plaque at the Right ICA bulb with stenosis approaching a RADIOGRAPHIC STRING SIGN (series 5, image 93). - 66% stenosis distal Left ICA bulb, although might be underestimated (same image). - up to moderate Left MCA M1 stenosis (series 10, image 20). 3. Involvement of the aortic arch including stringy plaque or thrombus in the distal arch (series 9, image 145). Aortic Atherosclerosis (ICD10-I70.0). 4. Widespread mild to moderate intracranial branch irregularity. Electronically Signed   By: Odessa Fleming M.D.   On: 01/31/2020 21:57   DG Abd 1 View  Result Date: 02/01/2020 CLINICAL DATA:  Spinal cord stimulator. EXAM: ABDOMEN - 1 VIEW COMPARISON:  None. FINDINGS:  No spinal cord stimulator or other implanted device is visible on these radiographs of the abdomen and pelvis. The spine is visualized superiorly to the T9 level on this study. Also note that no spinal cord stimulator was demonstrated on the topograms of yesterday's head and neck CTA which included the thoracic spine from the T9 level and above. Scattered stool and gas are present in nondilated colon. There is no evidence of bowel obstruction. Excreted contrast material is noted in the bladder. IMPRESSION: No spinal cord stimulator is present. Electronically Signed   By: Sebastian Ache M.D.   On: 02/01/2020 10:36   CT HEAD WO CONTRAST  Addendum Date: 01/31/2020   ADDENDUM REPORT:  01/31/2020 11:25 ADDENDUM: These results were called by telephone at the time of interpretation on 01/31/2020 at 11:24 am to provider Dr. Hyacinth Meeker, Who verbally acknowledged these results. Electronically Signed   By: Donzetta Kohut M.D.   On: 01/31/2020 11:25   Result Date: 01/31/2020 CLINICAL DATA:  Difficulty walking with slurred speech for the past week. EXAM: CT HEAD WITHOUT CONTRAST TECHNIQUE: Contiguous axial images were obtained from the base of the skull through the vertex without intravenous contrast. COMPARISON:  None. FINDINGS: Brain: No evidence of, hemorrhage, hydrocephalus, extra-axial collection or mass lesion/mass effect. Signs of atrophy and chronic microvascular ischemic change. There is however an area of hypodensity in the LEFT corona radiata. This area in the absence of priors is indeterminate. Vascular: No hyperdense vessel or unexpected calcification. Skull: Normal. Negative for fracture or focal lesion. Sinuses/Orbits: Visualized paranasal sinuses and orbits are normal. Other: Trace stranding in the occipital soft tissues, correlate with any history of trauma in this area. No hematoma. IMPRESSION: 1. Signs of atrophy and chronic microvascular ischemic change. There is however an area of hypodensity in the LEFT corona radiata out of which is indeterminate in the absence of priors. Acute or subacute infarct could potentially have this appearance. If there is high clinical concern MRI may be helpful for further assessment. 2. Trace stranding in the occipital soft tissues, correlate with any history of trauma in this area. No hematoma. Call is out to the referring provider to further discuss findings in the above case. Electronically Signed: By: Donzetta Kohut M.D. On: 01/31/2020 11:09   CT ANGIO NECK W OR WO CONTRAST  Result Date: 01/31/2020 CLINICAL DATA:  64 year old male with difficulty walking and slurred speech x1 week. Age indeterminate small vessel disease on plain CT today. EXAM: CT  ANGIOGRAPHY HEAD AND NECK TECHNIQUE: Multidetector CT imaging of the head and neck was performed using the standard protocol during bolus administration of intravenous contrast. Multiplanar CT image reconstructions and MIPs were obtained to evaluate the vascular anatomy. Carotid stenosis measurements (when applicable) are obtained utilizing NASCET criteria, using the distal internal carotid diameter as the denominator. CONTRAST:  80mL OMNIPAQUE IOHEXOL 350 MG/ML SOLN COMPARISON:  Head CT without contrast 1046 hours today. FINDINGS: CTA NECK Skeleton: Absent dentition. Lower cervical spine disc and endplate degeneration. No acute osseous abnormality identified. Upper chest: Negative. Other neck: No acute finding. Aortic arch: 3 vessel arch configuration with abundant soft mural plaque or thrombus throughout the arch (series 5, image 171) including a small stringy area of plaque or thrombus as demonstrated on series 9, image 145. Right carotid system: Mild to moderate brachiocephalic artery plaque without stenosis. Negative right CCA origin. Low-density plaque along the medial and anterior left CCA (series 5, image 113) without stenosis to the bifurcation. Bulky mostly soft plaque or thrombus at  the right ICA bulb results in high-grade stenosis approaching a radiographic string sign is seen on series 9, image 107. Stenosis continues along a segment of about 12 mm but the vessel remains patent to the skull base. Left carotid system: Mild plaque at the left CCA origin without stenosis. Medial low-density plaque in the left CCA similar to the right side without stenosis (series 5, image 126). At the left carotid bifurcation complex soft and calcified plaque appears partially ulcerated and results in maximal stenosis at the distal bulb (see series 8, image 135) numerically estimated at 66 % with respect to the distal vessel, but might be underestimated (series 7, image 184). The vessel remains patent to the skull base.  Vertebral arteries: Mild to moderate soft plaque in the proximal right subclavian artery without stenosis, but the right vertebral artery is occluded from its origin to the C2 level, with only faint reconstitution at the C1-C2 level, and additional moderate to severe stenosis in the distal V3 segment on series 7, image 157. Soft plaque in the proximal left subclavian artery does not result in significant stenosis. Left vertebral artery origin is patent. There is mild to moderate stenosis in the left V1 segment due to soft plaque (series 8, image 165). Beyond that the left vertebral artery appears dominant and is widely patent to the skull base. CTA HEAD Posterior circulation: Better enhancement of the non dominant right V4 segment with patent right PICA origin. Dominant left V4 segment is patent to the basilar with mild irregularity and no stenosis. Normal left PICA origin. Patent basilar artery with mild irregularity, no significant stenosis. Patent SCA and PCA origins. Posterior communicating arteries are diminutive or absent. Mild bilateral PCA branch irregularity including in the left P2 segment (series 12, image 33). Anterior circulation: Both ICA siphons are patent. Combined soft and calcified plaque on the left results in widespread mild to moderate left siphon irregularity and mild to moderate cavernous segment stenosis on series 9, image 157. Less pronounced right siphon plaque until the supraclinoid segment where there is mild to moderate stenosis due to calcified atherosclerosis. Patent carotid termini, MCA and ACA origins. Dominant left A1 segment (series 11, image 20). Anterior communicating artery is normal. There is mild bilateral ACA branch irregularity (series 12, image 30). Left MCA M1 segment demonstrates mild to moderate irregularity and stenosis as seen on series 10, image 20. Left MCA trifurcation remains patent. No left MCA branch occlusion identified. Mild to moderate left M2 and M3 branch  irregularity (series 10, image 20). Right MCA M1 segment and bifurcation are patent with only mild irregularity. Mild right MCA branch irregularity otherwise. Venous sinuses: Early contrast timing but grossly patent major venous structures. Anatomic variants: Dominant left vertebral V4 segment. Review of the MIP images confirms the above findings IMPRESSION: 1. 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 Probable retrograde supply to the right V4 segment, with moderate to severe stenosis of the vessel at the skull base. 2. Severe atherosclerosis in the head and neck with predominantly soft plaque. Significant stenoses include: - bulky soft plaque at the Right ICA bulb with stenosis approaching a RADIOGRAPHIC STRING SIGN (series 5, image 93). - 66% stenosis distal Left ICA bulb, although might be underestimated (same image). - up to moderate Left MCA M1 stenosis (series 10, image 20). 3. Involvement of the aortic arch including stringy plaque or thrombus in the distal arch (series 9, image 145). Aortic Atherosclerosis (ICD10-I70.0). 4.  Widespread mild to moderate intracranial branch irregularity. Electronically Signed   By: Odessa Fleming M.D.   On: 01/31/2020 21:57   MR BRAIN WO CONTRAST  Result Date: 02/01/2020 CLINICAL DATA:  Stroke suspected. Increasing slurred speech and unsteady gait with history of prior strokes. EXAM: MRI HEAD WITHOUT CONTRAST TECHNIQUE: Multiplanar, multiecho pulse sequences of the brain and surrounding structures were obtained without intravenous contrast. COMPARISON:  Head CT and CTA 01/31/2020 FINDINGS: Brain: There is an acute left lateral lenticulostriate territory infarct involving the corona radiata and lentiform nucleus, and there are chronic blood products more inferiorly in the left lentiform nucleus which appear to be associated with a chronic lacunar infarct. A punctate acute infarct is also noted in the left  periatrial white matter. There are scattered chronic cerebral microhemorrhages bilaterally including in the right basal ganglia. Patchy to confluent T2 hyperintensities in the cerebral white matter bilaterally are nonspecific but compatible with moderate chronic small vessel ischemic disease. There is a chronic lacunar infarct in the left corona radiata posterior to the acute infarct. There is mild to moderate cerebral atrophy. No mass, midline shift, or extra-axial fluid collection is identified. 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 Skull and upper cervical spine: Unremarkable bone marrow signal. Sinuses/Orbits: Unremarkable orbits. Paranasal sinuses and mastoid air cells are clear. Other: None. IMPRESSION: 1. Acute left basal ganglia/corona radiata infarct. 2. Punctate acute left periatrial white matter infarct. 3. Moderate chronic small vessel ischemic disease with chronic lacunar infarcts and microhemorrhages. Electronically Signed   By: Sebastian Ache M.D.   On: 02/01/2020 13:28   ECHOCARDIOGRAM COMPLETE  Result Date: 02/01/2020    ECHOCARDIOGRAM REPORT   Patient Name:   Marya Amsler Kinne Date of Exam: 02/01/2020 Medical Rec #:  [id number]   Height:       67.0 in Accession #:    [id number]  Weight:       207.9 lb Date of Birth:  [birth year]   BSA:          2.056 m Patient Age:    64 years    BP:           155/85 mmHg Patient Gender: M           HR:           74 bpm. Exam Location:  Inpatient Procedure: 2D Echo, Cardiac Doppler and Color Doppler Indications:    Stroke 434.91 / I163.9  History:        Patient has no prior history of Echocardiogram examinations.  Sonographer:    Renella Cunas RDCS Referring Phys: [phone] BRIAN MILLER IMPRESSIONS  1. Left ventricular ejection fraction, by estimation, is 55 to 60%. The left ventricle has normal function. The left ventricle has no regional wall motion abnormalities. Left ventricular diastolic parameters are consistent with Grade I diastolic dysfunction  (impaired relaxation).  2. Right ventricular systolic function is normal. The right ventricular size is normal.  3. The mitral valve is normal in structure. Trivial mitral valve regurgitation. No evidence of mitral stenosis.  4. The aortic valve is normal in structure. Aortic valve regurgitation is not visualized. Mild aortic valve sclerosis is present, with no evidence of aortic valve stenosis.  5. The inferior vena cava is normal in size with greater than 50% respiratory variability, suggesting right atrial pressure of 3 mmHg. FINDINGS  Left Ventricle: Left ventricular ejection fraction, by estimation, is 55 to 60%. The left ventricle has normal function. The left ventricle has no regional wall motion  abnormalities. The left ventricular internal cavity size was normal in size. There is  no left ventricular hypertrophy. Left ventricular diastolic parameters are consistent with Grade I diastolic dysfunction (impaired relaxation). Normal left ventricular filling pressure. Right Ventricle: The right ventricular size is normal. No increase in right ventricular wall thickness. Right ventricular systolic function is normal. Left Atrium: Left atrial size was normal in size. Right Atrium: Right atrial size was normal in size. Pericardium: There is no evidence of pericardial effusion. Mitral Valve: The mitral valve is normal in structure. Mild mitral annular calcification. Trivial mitral valve regurgitation. No evidence of mitral valve stenosis. Tricuspid Valve: The tricuspid valve is normal in structure. Tricuspid valve regurgitation is not demonstrated. No evidence of tricuspid stenosis. Aortic Valve: The aortic valve is normal in structure. Aortic valve regurgitation is not visualized. Mild aortic valve sclerosis is present, with no evidence of aortic valve stenosis. Pulmonic Valve: The pulmonic valve was normal in structure. Pulmonic valve regurgitation is not visualized. No evidence of pulmonic stenosis. Aorta: The  aortic root is normal in size and structure. Venous: The inferior vena cava is normal in size with greater than 50% respiratory variability, suggesting right atrial pressure of 3 mmHg. IAS/Shunts: No atrial level shunt detected by color flow Doppler.  LEFT VENTRICLE PLAX 2D LVIDd:         3.10 cm      Diastology LVIDs:         2.40 cm      LV e' medial:    4.14 cm/s LV PW:         1.00 cm      LV E/e' medial:  9.7 LV IVS:        1.00 cm      LV e' lateral:   6.16 cm/s LVOT diam:     2.30 cm      LV E/e' lateral: 6.5 LV SV:         67 LV SV Index:   33 LVOT Area:     4.15 cm  LV Volumes (MOD) LV vol d, MOD A2C: 101.0 ml LV vol d, MOD A4C: 91.5 ml LV vol s, MOD A2C: 51.7 ml LV vol s, MOD A4C: 47.8 ml LV SV MOD A2C:     49.3 ml LV SV MOD A4C:     91.5 ml LV SV MOD BP:      49.6 ml RIGHT VENTRICLE RV S prime:     9.64 cm/s TAPSE (M-mode): 1.9 cm LEFT ATRIUM             Index       RIGHT ATRIUM           Index LA diam:        3.00 cm 1.46 cm/m  RA Area:     12.40 cm LA Vol (A2C):   22.4 ml 10.90 ml/m RA Volume:   26.50 ml  12.89 ml/m LA Vol (A4C):   23.1 ml 11.24 ml/m LA Biplane Vol: 24.3 ml 11.82 ml/m  AORTIC VALVE LVOT Vmax:   105.00 cm/s LVOT Vmean:  58.900 cm/s LVOT VTI:    0.161 m  AORTA Ao Root diam: 3.20 cm MITRAL VALVE MV Area (PHT): 2.20 cm    SHUNTS MV Decel Time: 345 msec    Systemic VTI:  0.16 m MV E velocity: 40.10 cm/s  Systemic Diam: 2.30 cm MV A velocity: 57.50 cm/s MV E/A ratio:  0.70 Mihai Croitoru MD Electronically signed by Thurmon FairMihai Croitoru MD Signature  Date/Time: 02/01/2020/10:29:16 AM    Final    VAS US CAROTID  Result Date: 02/01/2020 Carotid Arterial Duplex Study Indications:                            CVA. Comparison Study:                       No prior study Pre-Surgical Evaluation & Surgical      Stenosis at RICA only. ICA is normal Correlation                             past the stenosis. Anatomy on the right                                         is within normal limits.Right                                          bifurcation is located near the                                         mandible. Performing Technologist: Gertie Fey MHA, RDMS, RVT, RDCS  Examination Guidelines: A complete evaluation includes B-mode imaging, spectral Doppler, color Doppler, and power Doppler as needed of all accessible portions of each vessel. Bilateral testing is considered an integral part of a complete examination. Limited examinations for reoccurring indications may be performed as noted.  Right Carotid Findings: +----------+--------+--------+--------+-----------------------+--------+           PSV cm/sEDV cm/sStenosisPlaque Description     Comments +----------+--------+--------+--------+-----------------------+--------+ CCA Prox  104     12              smooth and heterogenous         +----------+--------+--------+--------+-----------------------+--------+ CCA Distal62      13              smooth and heterogenous         +----------+--------+--------+--------+-----------------------+--------+ ICA Prox  538     232     80-99%  smooth and homogeneous          +----------+--------+--------+--------+-----------------------+--------+ ICA Mid   202     56                                              +----------+--------+--------+--------+-----------------------+--------+ ICA Distal24      12                                              +----------+--------+--------+--------+-----------------------+--------+ ECA       168     18              smooth and heterogenous         +----------+--------+--------+--------+-----------------------+--------+ +----------+--------+-------+----------------+-------------------+           PSV cm/sEDV cmsDescribe  Arm Pressure (mmHG) +----------+--------+-------+----------------+-------------------+ Subclavian89             Multiphasic, WNL                     +----------+--------+-------+----------------+-------------------+ +---------+--------+--+--------+-+---------+ VertebralPSV cm/s10EDV cm/s3Antegrade +---------+--------+--+--------+-+---------+  Left Carotid Findings: +----------+-------+-------+--------+---------------------------------+--------+           PSV    EDV    StenosisPlaque Description               Comments           cm/s   cm/s                                                     +----------+-------+-------+--------+---------------------------------+--------+ CCA Prox  126    23                                                       +----------+-------+-------+--------+---------------------------------+--------+ CCA Distal72     14             smooth, heterogenous and calcific         +----------+-------+-------+--------+---------------------------------+--------+ ICA Prox  175    73     40-59%  heterogenous, irregular and                                               calcific                                  +----------+-------+-------+--------+---------------------------------+--------+ ICA Mid   145    46                                                       +----------+-------+-------+--------+---------------------------------+--------+ ICA Distal87     24                                                       +----------+-------+-------+--------+---------------------------------+--------+ ECA       106    13                                                       +----------+-------+-------+--------+---------------------------------+--------+ +----------+--------+--------+----------------+-------------------+           PSV cm/sEDV cm/sDescribe        Arm Pressure (mmHG) +----------+--------+--------+----------------+-------------------+ [id number]              Multiphasic, WNL                    +----------+--------+--------+----------------+-------------------+  +---------+--------+--+--------+--+---------+  VertebralPSV cm/s63EDV cm/s18Antegrade +---------+--------+--+--------+--+---------+   Summary: Right 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 Left Carotid: Velocities in the left ICA are consistent with an upper range               40-59% stenosis. Vertebrals:  Bilateral vertebral arteries demonstrate antegrade flow. Subclavians: Normal flow hemodynamics were seen in bilateral subclavian              arteries. *See table(s) above for measurements and observations.  Electronically signed by Delia Heady MD on 02/01/2020 at 12:32:16 PM.    Final     Results/Tests Pending at Time of Discharge: None  Discharge Medications:  Allergies as of 02/03/2020   No Known Allergies     Medication List    STOP taking these medications   hydrochlorothiazide 25 MG tablet Commonly known as: HYDRODIURIL   lisinopril 10 MG tablet Commonly known as: ZESTRIL   naproxen 500 MG tablet Commonly known as: NAPROSYN   PARoxetine 40 MG tablet Commonly known as: PAXIL   prazosin 1 MG capsule Commonly known as: MINIPRESS   sildenafil 100 MG tablet Commonly known as: VIAGRA     TAKE these medications   amLODipine 10 MG tablet Commonly known as: NORVASC Take 10 mg by mouth daily.   aspirin 81 MG EC tablet Take 1 tablet (81 mg total) by mouth daily. Swallow whole. Start taking on: February 04, 2020 What changed:   medication strength  how much to take  additional instructions   atorvastatin 80 MG tablet Commonly known as: LIPITOR Take 80 mg by mouth daily.   Cholecalciferol 25 MCG (1000 UT) tablet Take 1,000 Units by mouth daily.   clopidogrel 75 MG tablet Commonly known as: PLAVIX Take 75 mg by mouth daily. Notes to patient: Plan x 3 months, then stop (March 2022) and continue ASA only for stroke prevention.   ferrous sulfate 325 (65 FE) MG tablet Take 325 mg by mouth daily with  breakfast.            Durable Medical Equipment  (From admission, onward)         Start     Ordered   02/01/20 1604  For home use only DME Walker rolling  Once       Question Answer Comment  Walker: With 5 Inch Wheels   Patient needs a walker to treat with the following condition Stroke [facility])      02/01/20 1604          Discharge Instructions: Please refer to Patient Instructions section of EMR for full details.  Patient was counseled important signs and symptoms that should prompt return to medical care, changes in medications, dietary instructions, activity restrictions, and follow up appointments.   Follow-Up Appointments:  Contact information for after-discharge care    Destination    [facility] Preferred SNF .   Service: Skilled Nursing Contact information: [address] [place] [address] [phone]                  Towanda Octave, MD 02/03/2020, 3:12 PM PGY-2, [facility]

## 2020-02-03 NOTE — Progress Notes (Incomplete)
Patient noted leaving unit via EMS on stretcher

## 2020-02-03 NOTE — TOC Transition Note (Signed)
Transition of Care [facility]) - CM/SW Discharge Note   Patient Details  Name: Boris Engelmann MRN: [id number] Date of Birth: 1955/09/08  [facility]) CM/SW Contact:  Kermit Balo, RN Phone Number: 02/03/2020, 12:40 PM   Clinical Narrative:    Pt is discharging to [facility]. Pt transporting via [facility]. Bedside RN updated and d/c packet at the desk.  Room: 120A Number for report: [phone]   Final next level of care: Skilled Nursing Facility Barriers to Discharge: No Barriers Identified   Patient Goals and CMS Choice   CMS Medicare.gov Compare Post Acute Care list provided to:: Patient Choice offered to / list presented to : Patient  Discharge Placement              Patient chooses bed at: [facility] Patient to be transferred to facility by: [facility] Name of family member notified: Tiffany-daughter Patient and family notified of of transfer: 02/03/20  Discharge Plan and Services In-house Referral: Clinical Social Work Discharge Planning Services: [employer] Consult Post Acute Care Choice: Skilled Nursing Facility                               Social Determinants of Health (SDOH) Interventions     Readmission Risk Interventions No flowsheet data found.

## 2020-02-03 NOTE — Progress Notes (Signed)
Called report to [phone] Marisue Ivan, RN.

## 2020-02-03 NOTE — Progress Notes (Signed)
STROKE TEAM PROGRESS NOTE   INTERVAL HISTORY Patient is sitting up in bed and states he is doing well.  R.  His dysarthria is improved.  He states now he is going to be compliant with his medications and medical follow-up.  Vitals:   02/03/20 0327 02/03/20 0329 02/03/20 0759 02/03/20 1213  BP: (!) 172/82 (!) 151/79 (!) 162/102 (!) 160/79  Pulse: 64 62 60 (!) 58  Resp: 14 18 16 16   Temp: 97.6 F (36.4 C) 98.7 F (37.1 C) 98 F (36.7 C) 98 F (36.7 C)  TempSrc: Oral Oral Oral Oral  SpO2: 97% 99% 99% 99%  Weight:      Height:       CBC:  Recent Labs  Lab 01/31/20 1031 01/31/20 1047  WBC 9.4  --   NEUTROABS 4.3  --   HGB 16.1 17.0  HCT 50.1 50.0  MCV 95.2  --   PLT 345  --    Basic Metabolic Panel:  Recent Labs  Lab 02/01/20 0615 02/02/20 0649  NA 138 138  K 3.5 3.5  CL 100 101  CO2 25 27  GLUCOSE 80 111*  BUN 15 19  CREATININE 1.46* 1.43*  CALCIUM 9.7 9.3   Lipid Panel:  Recent Labs  Lab 02/01/20 0246  CHOL 203*  TRIG 138  HDL 30*  CHOLHDL 6.8  VLDL 28  LDLCALC 14/07/21*   HgbA1c:  Recent Labs  Lab 02/01/20 0246  HGBA1C 5.7*   Urine Drug Screen:  Recent Labs  Lab 01/31/20 1120  LABOPIA NONE DETECTED  COCAINSCRNUR NONE DETECTED  LABBENZ NONE DETECTED  AMPHETMU NONE DETECTED  THCU POSITIVE*  LABBARB NONE DETECTED    Alcohol Level  Recent Labs  Lab 01/31/20 2026  ETH <10    IMAGING past 24 hours No results found.  PHYSICAL EXAM   Middle-aged African-American male not in distress. . Afebrile. Head is nontraumatic. Neck is supple without bruit.    Cardiac exam no murmur or gallop. Lungs are clear to auscultation. Distal pulses are well felt. Neurological Exam : Patient is awake alert oriented time place and person.  No aphasia apraxia or dysarthria.  Extraocular movements are full range without nystagmus.  Blinks to threat bilaterally.  Mild right lower facial asymmetry when he smiles.  Tongue midline.  Mild right hemiparesis but effort is  poor for isolated muscle testing.  Fine finger movements are diminished on the right compared to the left orbits left over right upper extremity.  Sensation appears preserved bilaterally.  Coordination is slow but accurate.  Gait not tested.   ASSESSMENT/PLAN [name] is a 64 y.o. male with history of stroke, tobacco use and medical non compliance presenting with slurred speech x 3 days. He had stopped taking meds prior to admission.   Stroke:  L basal ganglia and L white matter infarcts secondary to small vessel disease .asymptomatic near occlusive right carotid stenosis with string sign   CT head L corona radiata hypodensity. Small vessel disease. Atrophy. Trace stranding occipital soft tissues.   CTA head & neck no ELVO. 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 Severe atherosclerosis:  R ICA string sign; L ICA 66%; moderate L M1. Distal aortic arch stringy plaque w/ thrombus. Moderate to moderate intracranial branch irregularity.   MRI  L basal ganglia / corona radiata infarct. Punctate L periatrial white matter infarct. Moderate small vessel disease w/ chronic lacunes and microhemorrhages.  Carotid Doppler  R ICA 80-99%, L ICA 40-59%  2D Echo EF 55-60%. No source of embolus   LDL 145  HgbA1c 5.7  VTE prophylaxis - Lovenox 40 mg sq daily   none (had stopped taking prescribed aspirin and plavix) prior to admission, now on aspirin 81 mg daily and clopidogrel 75 mg daily.   Therapy recommendations:  SNF recommended  Disposition: SNF - awaiting bed availability  Bilateral Carotid Stenosis   Carotid Doppler  R ICA 80-99%, L ICA 40-59%   CTA neck R ICA string sign; L ICA 66%. Distal aortic arch stringy plaque w/ thrombus.  Not the etiology of current stroke  Pt noncomplaint with all meds PTA  Discussed surgical intervention, consult "I am not doing any surgery!" discussed w/ pt, family member at bedside and daughter on phone. They will discuss further  Can  do as OP if he is agreeable  Aortic Arch Thromus  CTA neck Distal aortic arch stringy plaque w/ thrombus.  Pt noncompliant w/ meds  Not a good AC candidate  Hypertension  Stable . Permissive hypertension (OK if < 220/120) but gradually normalize in 5-7 days . Long-term BP goal normotensive  Hyperlipidemia  Home meds:  Prescribed lipitor 80 but had stopped taking  Resumed lipitor 80 in hospital  LDL 145, goal < 70  Continue statin at discharge  Prediabetes  HgbA1c 5.7  Other Stroke Risk Factors  Advanced Age >/= 8   Cigarette smoker, advised to stop smoking  Concern for alcohol use  Substance abuse - UDS:  THC POSITIVE. Patient advised to stop using due to stroke risk.  Obesity, Body mass index is 32.56 kg/m., BMI >/= 30 associated with increased stroke risk, recommend weight loss, diet and exercise as appropriate   Hx stroke/TIA - details not available   Family hx stroke (father)  Other Active Problems  Abnormal EKG  CKD vs AKI - creatinine 1.43  Concern for cognitive impairment  Hospital day # 2 Recommend aspirin 81 mg and Plavix 75 mg daily for 3 months given high-grade carotid stenosis string sign followed by aspirin alone.  Patient counseled to quit smoking cigarettes.  Aggressive risk factor modification.  Vascular surgery consult for elective right carotid revascularization. later as an outpatient.  Stroke team will sign off.  Kindly call for questions. Delia Heady, MD  To contact Stroke Continuity provider, please refer to [email or web address]. After hours, contact General Neurology

## 2020-02-03 NOTE — Progress Notes (Signed)
Physical Therapy Treatment Patient Details Name: Alec Sanchez MRN: [id number] DOB: [birth year] Today's Date: 02/03/2020    History of Present Illness Alec Sanchez is a 64 y.o. male presenting with progressively worsening  Slurred speech and right sided weakness. (mri after session Acute left basal ganglia/corona radiata infarct.PMH is significant for stroke with residual right sided weakness, HTN, HLD. UDS + for marijuana. h/o ETOH abuse.    PT Comments    Focused session on increasing bilat leg muscular strength for more stability and symmetry with gait. Performed sit to stands x10 reps from low bed level with hands on thighs and R foot posterior to L to encourage more utilization of R leg muscles. Also, performed x10 reps bilat of standing marching and hip abduction with hands on RW. Pt reported fatigue with each exercise, indicating appropriate exercise intensity. Finished session with gait training, cuing pt to inc R step length and remain in middle of RW, with momentary success. Will continue to follow acutely. Recommendations updated to SNF.   Follow Up Recommendations  Supervision/Assistance - 24 hour;SNF     Equipment Recommendations  Rolling walker with 5" wheels    Recommendations for Other Services       Precautions / Restrictions Precautions Precautions: Fall Precaution Comments: Residual R hemi from previous stroke Restrictions Weight Bearing Restrictions: No    Mobility  Bed Mobility Overal bed mobility: Needs Assistance Bed Mobility: Supine to Sit     Supine to sit: Supervision;HOB elevated     General bed mobility comments: Pt able to come to sit EOB with use of bed rails and HOB elevated with extra time and supervision for safety.  Transfers Overall transfer level: Needs assistance Equipment used: Rolling walker (2 wheeled) Transfers: Sit to/from Stand Sit to Stand: Min guard         General transfer comment: Cues for hand placement and min guard and  extra time to power up to stand safely, no overt LOB.  Ambulation/Gait Ambulation/Gait assistance: Min guard;Min assist Gait Distance (Feet): 210 Feet Assistive device: Rolling walker (2 wheeled) Gait Pattern/deviations: Step-through pattern;Decreased stride length;Decreased step length - right;Narrow base of support;Drifts right/left Gait velocity: decresaed Gait velocity interpretation: <1.31 ft/sec, indicative of household ambulator General Gait Details: Pt tends to drift to R side of RW and requires cues to acknowledge and correct. Provided visual and verbal cues to increase bilat step length, esp on R to make more symmetrical, with success. No overt LOB, but intermittent minA to manage RW.   Stairs             Wheelchair Mobility    Modified Rankin (Stroke Patients Only) Modified Rankin (Stroke Patients Only) Pre-Morbid Rankin Score: Slight disability Modified Rankin: Moderately severe disability     Balance Overall balance assessment: Needs assistance Sitting-balance support: Feet supported;No upper extremity supported Sitting balance-Leahy Scale: Good Sitting balance - Comments: Sitting EOB with supervision.   Standing balance support: Bilateral upper extremity supported;During functional activity Standing balance-Leahy Scale: Poor Standing balance comment: reliant on RW or BIL UE support                            Cognition Arousal/Alertness: Awake/alert Behavior During Therapy: WFL for tasks assessed/performed Overall Cognitive Status: Impaired/Different from baseline Area of Impairment: Safety/judgement;Awareness                         Safety/Judgement: Decreased awareness of safety;Decreased awareness of deficits  Awareness: Emergent   General Comments: Pt requires cues to acknowledge his body placement within his RW and to correct it. Cues required to manage RW to maintain safety.      Exercises General Exercises - Lower  Extremity Hip ABduction/ADduction: AROM;Strengthening;Both;10 reps;Standing (with UE support on RW) Hip Flexion/Marching: AROM;Strengthening;Both;10 reps;Standing (with UEs on RW) Mini-Sqauts: Strengthening;Both;10 reps (sit to stand from low bed level with UEs on thighs and L foot anterior to R to encourage increased use of R leg)    General Comments        Pertinent Vitals/Pain Pain Assessment: No/denies pain    Home Living                      Prior Function            PT Goals (current goals can now be found in the care plan section) Acute Rehab PT Goals Patient Stated Goal: to improve PT Goal Formulation: With patient Time For Goal Achievement: 02/15/20 Potential to Achieve Goals: Good Progress towards PT goals: Progressing toward goals    Frequency    Min 4X/week      PT Plan Discharge plan needs to be updated    Co-evaluation              AM-PAC PT "6 Clicks" Mobility   Outcome Measure  Help needed turning from your back to your side while in a flat bed without using bedrails?: A Little Help needed moving from lying on your back to sitting on the side of a flat bed without using bedrails?: A Little Help needed moving to and from a bed to a chair (including a wheelchair)?: A Little Help needed standing up from a chair using your arms (e.g., wheelchair or bedside chair)?: A Little Help needed to walk in hospital room?: A Little Help needed climbing 3-5 steps with a railing? : A Lot 6 Click Score: 17    End of Session Equipment Utilized During Treatment: Gait belt Activity Tolerance: Patient tolerated treatment well Patient left: with call bell/phone within reach;in bed;with bed alarm set   PT Visit Diagnosis: Unsteadiness on feet (R26.81);Difficulty in walking, not elsewhere classified (R26.2);Other abnormalities of gait and mobility (R26.89)     Time: 1610-9604 PT Time Calculation (min) (ACUTE ONLY): 25 min  Charges:  $Gait Training:  8-22 mins $Therapeutic Exercise: 8-22 mins                     [name], PT, DPT Acute Rehabilitation Services  Pager: [phone] Office: [phone]    [name] 02/03/2020, 3:12 PM

## 2020-02-28 DIAGNOSIS — E785 Hyperlipidemia, unspecified: Secondary | ICD-10-CM | POA: Diagnosis not present

## 2020-02-28 DIAGNOSIS — Z720 Tobacco use: Secondary | ICD-10-CM | POA: Diagnosis not present

## 2020-02-28 DIAGNOSIS — Z7902 Long term (current) use of antithrombotics/antiplatelets: Secondary | ICD-10-CM | POA: Diagnosis not present

## 2020-02-28 DIAGNOSIS — I69322 Dysarthria following cerebral infarction: Secondary | ICD-10-CM | POA: Diagnosis not present

## 2020-02-28 DIAGNOSIS — Z9682 Presence of neurostimulator: Secondary | ICD-10-CM | POA: Diagnosis not present

## 2020-02-28 DIAGNOSIS — Z7982 Long term (current) use of aspirin: Secondary | ICD-10-CM | POA: Diagnosis not present

## 2020-02-28 DIAGNOSIS — N182 Chronic kidney disease, stage 2 (mild): Secondary | ICD-10-CM | POA: Diagnosis not present

## 2020-02-28 DIAGNOSIS — I129 Hypertensive chronic kidney disease with stage 1 through stage 4 chronic kidney disease, or unspecified chronic kidney disease: Secondary | ICD-10-CM | POA: Diagnosis not present

## 2020-02-28 DIAGNOSIS — R7303 Prediabetes: Secondary | ICD-10-CM | POA: Diagnosis not present

## 2020-02-28 DIAGNOSIS — I69351 Hemiplegia and hemiparesis following cerebral infarction affecting right dominant side: Secondary | ICD-10-CM | POA: Diagnosis not present

## 2020-02-28 DIAGNOSIS — I6523 Occlusion and stenosis of bilateral carotid arteries: Secondary | ICD-10-CM | POA: Diagnosis not present

## 2020-03-01 DIAGNOSIS — Z7902 Long term (current) use of antithrombotics/antiplatelets: Secondary | ICD-10-CM | POA: Diagnosis not present

## 2020-03-01 DIAGNOSIS — Z720 Tobacco use: Secondary | ICD-10-CM | POA: Diagnosis not present

## 2020-03-01 DIAGNOSIS — N182 Chronic kidney disease, stage 2 (mild): Secondary | ICD-10-CM | POA: Diagnosis not present

## 2020-03-01 DIAGNOSIS — R7303 Prediabetes: Secondary | ICD-10-CM | POA: Diagnosis not present

## 2020-03-01 DIAGNOSIS — I69351 Hemiplegia and hemiparesis following cerebral infarction affecting right dominant side: Secondary | ICD-10-CM | POA: Diagnosis not present

## 2020-03-01 DIAGNOSIS — I6523 Occlusion and stenosis of bilateral carotid arteries: Secondary | ICD-10-CM | POA: Diagnosis not present

## 2020-03-01 DIAGNOSIS — Z9682 Presence of neurostimulator: Secondary | ICD-10-CM | POA: Diagnosis not present

## 2020-03-01 DIAGNOSIS — I69322 Dysarthria following cerebral infarction: Secondary | ICD-10-CM | POA: Diagnosis not present

## 2020-03-01 DIAGNOSIS — I129 Hypertensive chronic kidney disease with stage 1 through stage 4 chronic kidney disease, or unspecified chronic kidney disease: Secondary | ICD-10-CM | POA: Diagnosis not present

## 2020-03-01 DIAGNOSIS — E785 Hyperlipidemia, unspecified: Secondary | ICD-10-CM | POA: Diagnosis not present

## 2020-03-01 DIAGNOSIS — Z7982 Long term (current) use of aspirin: Secondary | ICD-10-CM | POA: Diagnosis not present

## 2020-03-02 DIAGNOSIS — I69322 Dysarthria following cerebral infarction: Secondary | ICD-10-CM | POA: Diagnosis not present

## 2020-03-02 DIAGNOSIS — R7303 Prediabetes: Secondary | ICD-10-CM | POA: Diagnosis not present

## 2020-03-02 DIAGNOSIS — I69351 Hemiplegia and hemiparesis following cerebral infarction affecting right dominant side: Secondary | ICD-10-CM | POA: Diagnosis not present

## 2020-03-02 DIAGNOSIS — E785 Hyperlipidemia, unspecified: Secondary | ICD-10-CM | POA: Diagnosis not present

## 2020-03-02 DIAGNOSIS — N182 Chronic kidney disease, stage 2 (mild): Secondary | ICD-10-CM | POA: Diagnosis not present

## 2020-03-02 DIAGNOSIS — I6523 Occlusion and stenosis of bilateral carotid arteries: Secondary | ICD-10-CM | POA: Diagnosis not present

## 2020-03-02 DIAGNOSIS — Z9682 Presence of neurostimulator: Secondary | ICD-10-CM | POA: Diagnosis not present

## 2020-03-02 DIAGNOSIS — Z720 Tobacco use: Secondary | ICD-10-CM | POA: Diagnosis not present

## 2020-03-02 DIAGNOSIS — I129 Hypertensive chronic kidney disease with stage 1 through stage 4 chronic kidney disease, or unspecified chronic kidney disease: Secondary | ICD-10-CM | POA: Diagnosis not present

## 2020-03-02 DIAGNOSIS — Z7902 Long term (current) use of antithrombotics/antiplatelets: Secondary | ICD-10-CM | POA: Diagnosis not present

## 2020-03-02 DIAGNOSIS — Z7982 Long term (current) use of aspirin: Secondary | ICD-10-CM | POA: Diagnosis not present

## 2020-03-06 DIAGNOSIS — E785 Hyperlipidemia, unspecified: Secondary | ICD-10-CM | POA: Diagnosis not present

## 2020-03-06 DIAGNOSIS — Z7982 Long term (current) use of aspirin: Secondary | ICD-10-CM | POA: Diagnosis not present

## 2020-03-06 DIAGNOSIS — Z9682 Presence of neurostimulator: Secondary | ICD-10-CM | POA: Diagnosis not present

## 2020-03-06 DIAGNOSIS — I69351 Hemiplegia and hemiparesis following cerebral infarction affecting right dominant side: Secondary | ICD-10-CM | POA: Diagnosis not present

## 2020-03-06 DIAGNOSIS — N182 Chronic kidney disease, stage 2 (mild): Secondary | ICD-10-CM | POA: Diagnosis not present

## 2020-03-06 DIAGNOSIS — R7303 Prediabetes: Secondary | ICD-10-CM | POA: Diagnosis not present

## 2020-03-06 DIAGNOSIS — I69322 Dysarthria following cerebral infarction: Secondary | ICD-10-CM | POA: Diagnosis not present

## 2020-03-06 DIAGNOSIS — I129 Hypertensive chronic kidney disease with stage 1 through stage 4 chronic kidney disease, or unspecified chronic kidney disease: Secondary | ICD-10-CM | POA: Diagnosis not present

## 2020-03-06 DIAGNOSIS — I6523 Occlusion and stenosis of bilateral carotid arteries: Secondary | ICD-10-CM | POA: Diagnosis not present

## 2020-03-06 DIAGNOSIS — Z720 Tobacco use: Secondary | ICD-10-CM | POA: Diagnosis not present

## 2020-03-06 DIAGNOSIS — Z7902 Long term (current) use of antithrombotics/antiplatelets: Secondary | ICD-10-CM | POA: Diagnosis not present

## 2020-03-07 DIAGNOSIS — Z7982 Long term (current) use of aspirin: Secondary | ICD-10-CM | POA: Diagnosis not present

## 2020-03-07 DIAGNOSIS — Z720 Tobacco use: Secondary | ICD-10-CM | POA: Diagnosis not present

## 2020-03-07 DIAGNOSIS — R7303 Prediabetes: Secondary | ICD-10-CM | POA: Diagnosis not present

## 2020-03-07 DIAGNOSIS — E785 Hyperlipidemia, unspecified: Secondary | ICD-10-CM | POA: Diagnosis not present

## 2020-03-07 DIAGNOSIS — I69351 Hemiplegia and hemiparesis following cerebral infarction affecting right dominant side: Secondary | ICD-10-CM | POA: Diagnosis not present

## 2020-03-07 DIAGNOSIS — Z9682 Presence of neurostimulator: Secondary | ICD-10-CM | POA: Diagnosis not present

## 2020-03-07 DIAGNOSIS — I69322 Dysarthria following cerebral infarction: Secondary | ICD-10-CM | POA: Diagnosis not present

## 2020-03-07 DIAGNOSIS — Z7902 Long term (current) use of antithrombotics/antiplatelets: Secondary | ICD-10-CM | POA: Diagnosis not present

## 2020-03-07 DIAGNOSIS — I129 Hypertensive chronic kidney disease with stage 1 through stage 4 chronic kidney disease, or unspecified chronic kidney disease: Secondary | ICD-10-CM | POA: Diagnosis not present

## 2020-03-07 DIAGNOSIS — I6523 Occlusion and stenosis of bilateral carotid arteries: Secondary | ICD-10-CM | POA: Diagnosis not present

## 2020-03-07 DIAGNOSIS — N182 Chronic kidney disease, stage 2 (mild): Secondary | ICD-10-CM | POA: Diagnosis not present

## 2020-03-08 DIAGNOSIS — I129 Hypertensive chronic kidney disease with stage 1 through stage 4 chronic kidney disease, or unspecified chronic kidney disease: Secondary | ICD-10-CM | POA: Diagnosis not present

## 2020-03-08 DIAGNOSIS — R7303 Prediabetes: Secondary | ICD-10-CM | POA: Diagnosis not present

## 2020-03-08 DIAGNOSIS — E785 Hyperlipidemia, unspecified: Secondary | ICD-10-CM | POA: Diagnosis not present

## 2020-03-08 DIAGNOSIS — Z7902 Long term (current) use of antithrombotics/antiplatelets: Secondary | ICD-10-CM | POA: Diagnosis not present

## 2020-03-08 DIAGNOSIS — Z7982 Long term (current) use of aspirin: Secondary | ICD-10-CM | POA: Diagnosis not present

## 2020-03-08 DIAGNOSIS — Z720 Tobacco use: Secondary | ICD-10-CM | POA: Diagnosis not present

## 2020-03-08 DIAGNOSIS — Z9682 Presence of neurostimulator: Secondary | ICD-10-CM | POA: Diagnosis not present

## 2020-03-08 DIAGNOSIS — I6523 Occlusion and stenosis of bilateral carotid arteries: Secondary | ICD-10-CM | POA: Diagnosis not present

## 2020-03-08 DIAGNOSIS — I69351 Hemiplegia and hemiparesis following cerebral infarction affecting right dominant side: Secondary | ICD-10-CM | POA: Diagnosis not present

## 2020-03-08 DIAGNOSIS — N182 Chronic kidney disease, stage 2 (mild): Secondary | ICD-10-CM | POA: Diagnosis not present

## 2020-03-08 DIAGNOSIS — I69322 Dysarthria following cerebral infarction: Secondary | ICD-10-CM | POA: Diagnosis not present

## 2020-03-09 DIAGNOSIS — I6523 Occlusion and stenosis of bilateral carotid arteries: Secondary | ICD-10-CM | POA: Diagnosis not present

## 2020-03-09 DIAGNOSIS — Z720 Tobacco use: Secondary | ICD-10-CM | POA: Diagnosis not present

## 2020-03-09 DIAGNOSIS — R7303 Prediabetes: Secondary | ICD-10-CM | POA: Diagnosis not present

## 2020-03-09 DIAGNOSIS — Z9682 Presence of neurostimulator: Secondary | ICD-10-CM | POA: Diagnosis not present

## 2020-03-09 DIAGNOSIS — E785 Hyperlipidemia, unspecified: Secondary | ICD-10-CM | POA: Diagnosis not present

## 2020-03-09 DIAGNOSIS — Z7902 Long term (current) use of antithrombotics/antiplatelets: Secondary | ICD-10-CM | POA: Diagnosis not present

## 2020-03-09 DIAGNOSIS — I69322 Dysarthria following cerebral infarction: Secondary | ICD-10-CM | POA: Diagnosis not present

## 2020-03-09 DIAGNOSIS — N182 Chronic kidney disease, stage 2 (mild): Secondary | ICD-10-CM | POA: Diagnosis not present

## 2020-03-09 DIAGNOSIS — I69351 Hemiplegia and hemiparesis following cerebral infarction affecting right dominant side: Secondary | ICD-10-CM | POA: Diagnosis not present

## 2020-03-09 DIAGNOSIS — I10 Essential (primary) hypertension: Secondary | ICD-10-CM | POA: Diagnosis not present

## 2020-03-09 DIAGNOSIS — Z7982 Long term (current) use of aspirin: Secondary | ICD-10-CM | POA: Diagnosis not present

## 2020-03-09 DIAGNOSIS — I129 Hypertensive chronic kidney disease with stage 1 through stage 4 chronic kidney disease, or unspecified chronic kidney disease: Secondary | ICD-10-CM | POA: Diagnosis not present

## 2020-03-10 ENCOUNTER — Encounter (HOSPITAL_COMMUNITY): Payer: Self-pay | Admitting: Emergency Medicine

## 2020-03-10 ENCOUNTER — Emergency Department (HOSPITAL_COMMUNITY)
Admission: EM | Admit: 2020-03-10 | Discharge: 2020-03-10 | Disposition: A | Discharge status: Home / Self Care | Payer: Medicare Other | Attending: Emergency Medicine | Admitting: Emergency Medicine

## 2020-03-10 ENCOUNTER — Emergency Department (HOSPITAL_COMMUNITY): Payer: Medicare Other

## 2020-03-10 ENCOUNTER — Other Ambulatory Visit: Payer: Self-pay

## 2020-03-10 DIAGNOSIS — N179 Acute kidney failure, unspecified: Secondary | ICD-10-CM | POA: Diagnosis not present

## 2020-03-10 DIAGNOSIS — R4781 Slurred speech: Secondary | ICD-10-CM | POA: Diagnosis not present

## 2020-03-10 DIAGNOSIS — R531 Weakness: Secondary | ICD-10-CM | POA: Diagnosis not present

## 2020-03-10 DIAGNOSIS — I959 Hypotension, unspecified: Secondary | ICD-10-CM | POA: Diagnosis not present

## 2020-03-10 DIAGNOSIS — Z7982 Long term (current) use of aspirin: Secondary | ICD-10-CM | POA: Diagnosis not present

## 2020-03-10 DIAGNOSIS — N3000 Acute cystitis without hematuria: Secondary | ICD-10-CM

## 2020-03-10 HISTORY — DX: Cerebral infarction, unspecified: I63.9

## 2020-03-10 LAB — COMPREHENSIVE METABOLIC PANEL
ALT: 30 U/L (ref 0–44)
AST: 19 U/L (ref 15–41)
Albumin: 4.1 g/dL (ref 3.5–5.0)
Alkaline Phosphatase: 70 U/L (ref 38–126)
Anion gap: 13 (ref 5–15)
BUN: 24 mg/dL — ABNORMAL HIGH (ref 8–23)
CO2: 22 mmol/L (ref 22–32)
Calcium: 9.4 mg/dL (ref 8.9–10.3)
Chloride: 100 mmol/L (ref 98–111)
Creatinine, Ser: 2.44 mg/dL — ABNORMAL HIGH (ref 0.61–1.24)
GFR, Estimated: 29 mL/min — ABNORMAL LOW (ref >60)
Glucose, Bld: 97 mg/dL (ref 70–99)
Potassium: 3.9 mmol/L (ref 3.5–5.1)
Sodium: 135 mmol/L (ref 135–145)
Total Bilirubin: 0.6 mg/dL (ref 0.3–1.2)
Total Protein: 7.5 g/dL (ref 6.5–8.1)

## 2020-03-10 LAB — DIFFERENTIAL
Abs Immature Granulocytes: 0.03 10*3/uL (ref 0.00–0.07)
Basophils Absolute: 0.1 10*3/uL (ref 0.0–0.1)
Basophils Relative: 1 %
Eosinophils Absolute: 0.3 10*3/uL (ref 0.0–0.5)
Eosinophils Relative: 2 %
Immature Granulocytes: 0 %
Lymphocytes Relative: 38 %
Lymphs Abs: 4.1 10*3/uL — ABNORMAL HIGH (ref 0.7–4.0)
Monocytes Absolute: 0.9 10*3/uL (ref 0.1–1.0)
Monocytes Relative: 8 %
Neutro Abs: 5.4 10*3/uL (ref 1.7–7.7)
Neutrophils Relative %: 51 %

## 2020-03-10 LAB — URINALYSIS, ROUTINE W REFLEX MICROSCOPIC
Bilirubin Urine: NEGATIVE
Glucose, UA: NEGATIVE mg/dL
Hgb urine dipstick: NEGATIVE
Ketones, ur: NEGATIVE mg/dL
Nitrite: NEGATIVE
Protein, ur: NEGATIVE mg/dL
Specific Gravity, Urine: 1.019 (ref 1.005–1.030)
pH: 5 (ref 5.0–8.0)

## 2020-03-10 LAB — CBC
HCT: 43.8 % (ref 39.0–52.0)
Hemoglobin: 14.8 g/dL (ref 13.0–17.0)
MCH: 30.9 pg (ref 26.0–34.0)
MCHC: 33.8 g/dL (ref 30.0–36.0)
MCV: 91.4 fL (ref 80.0–100.0)
Platelets: 320 10*3/uL (ref 150–400)
RBC: 4.79 MIL/uL (ref 4.22–5.81)
RDW: 12.8 % (ref 11.5–15.5)
WBC: 10.7 10*3/uL — ABNORMAL HIGH (ref 4.0–10.5)
nRBC: 0 % (ref 0.0–0.2)

## 2020-03-10 LAB — I-STAT CHEM 8, ED
BUN: 28 mg/dL — ABNORMAL HIGH (ref 8–23)
Calcium, Ion: 1.2 mmol/L (ref 1.15–1.40)
Chloride: 102 mmol/L (ref 98–111)
Creatinine, Ser: 2.4 mg/dL — ABNORMAL HIGH (ref 0.61–1.24)
Glucose, Bld: 97 mg/dL (ref 70–99)
HCT: 45 % (ref 39.0–52.0)
Hemoglobin: 15.3 g/dL (ref 13.0–17.0)
Potassium: 3.9 mmol/L (ref 3.5–5.1)
Sodium: 136 mmol/L (ref 135–145)
TCO2: 24 mmol/L (ref 22–32)

## 2020-03-10 LAB — CBG MONITORING, ED: Glucose-Capillary: 152 mg/dL — ABNORMAL HIGH (ref 70–99)

## 2020-03-10 LAB — APTT: aPTT: 33 seconds (ref 24–36)

## 2020-03-10 LAB — PROTIME-INR
INR: 1 (ref 0.8–1.2)
Prothrombin Time: 13.2 seconds (ref 11.4–15.2)

## 2020-03-10 MED ORDER — SODIUM CHLORIDE 0.9% FLUSH
3.0000 mL | Freq: Once | INTRAVENOUS | Status: AC
Start: 2020-03-10 — End: 2020-03-10
  Administered 2020-03-10: 3 mL via INTRAVENOUS

## 2020-03-10 MED ORDER — CEPHALEXIN 250 MG PO CAPS
1000.0000 mg | ORAL_CAPSULE | Freq: Once | ORAL | Status: AC
  Administered 2020-03-10: 1000 mg via ORAL
  Filled 2020-03-10: qty 4

## 2020-03-10 MED ORDER — CEPHALEXIN 500 MG PO CAPS: 1000.0000 mg | ORAL_CAPSULE | Freq: Two times a day (BID) | ORAL | 0 refills | Status: DC

## 2020-03-10 NOTE — ED Notes (Signed)
Pt discharged ambulatory. All questions and concerns addressed. No complaints at this time.   

## 2020-03-10 NOTE — ED Provider Notes (Signed)
[facility] EMERGENCY DEPARTMENT Provider Note   CSN: [id number] Arrival date & time: 03/10/20  1500     History Chief Complaint  Patient presents with  . Aphasia  . Hypotension    Alec Sanchez is a 65 y.o. male.  HPI Patient has history of stroke and some residual right-sided weakness from early December.  Patient reports he was feeling fine yesterday.  Today he going to a scheduled follow-up appointment at the [place] in [place].  He reports he felt a little generally weak and may be not as well today.  Family question whether or not his speech was slightly more slurred than what is the baseline.  Patient felt that this was just a manifestation of his prior stroke symptoms and did not note any new slurring or weakness.  When at the doctor's office, his systolic blood pressure was noted to be in the 80s.  Patient denies he has had fever chills vomiting or diarrhea.  He reports he has been compliant with all of his medications.  During the visit today adjustments were made for discontinuing the hydrochlorothiazide and continue lisinopril at 10 mg 3 times daily.  Also patient's amlodipine 10 mg a day continued.    Past Medical History:  Diagnosis Date  . Stroke [facility])     Patient Active Problem List   Diagnosis Date Noted  . Acute ischemic stroke (HCC) 01/31/2020         No family history on file.     Home Medications Prior to Admission medications   Medication Sig Start Date End Date Taking? Authorizing Provider  cephALEXin (KEFLEX) 500 MG capsule Take 2 capsules (1,000 mg total) by mouth 2 (two) times daily. 03/10/20  Yes Arby Barrette, MD  amLODipine (NORVASC) 10 MG tablet Take 10 mg by mouth daily.    [provider]  aspirin EC 81 MG EC tablet Take 1 tablet (81 mg total) by mouth daily. Swallow whole. 02/04/20   Lilland, Alana, DO  atorvastatin (LIPITOR) 80 MG tablet Take 80 mg by mouth daily.    [provider]  Cholecalciferol 25  MCG (1000 UT) tablet Take 1,000 Units by mouth daily.    [provider]  clopidogrel (PLAVIX) 75 MG tablet Take 75 mg by mouth daily.    [provider]  ferrous sulfate 325 (65 FE) MG tablet Take 325 mg by mouth daily with breakfast.    [provider]    Allergies    Patient has no known allergies.  Review of Systems   Review of Systems 10 systems reviewed and negative except as per HPI Physical Exam Updated Vital Signs BP 119/70   Pulse 65   Temp (!) 97.4 F (36.3 C) (Oral)   Resp 19   SpO2 96%   Physical Exam Constitutional:      Comments: Patient is alert and nontoxic.  Clinically well in appearance.  No acute distress.  No respiratory distress.  HENT:     Head: Normocephalic and atraumatic.     Mouth/Throat:     Mouth: Mucous membranes are moist.     Pharynx: Oropharynx is clear.  Eyes:     Extraocular Movements: Extraocular movements intact.     Pupils: Pupils are equal, round, and reactive to light.  Cardiovascular:     Rate and Rhythm: Normal rate and regular rhythm.  Pulmonary:     Effort: Pulmonary effort is normal.     Breath sounds: Normal breath sounds.  Abdominal:  General: There is no distension.     Palpations: Abdomen is soft.     Tenderness: There is no abdominal tenderness.  Musculoskeletal:        General: No swelling or tenderness. Normal range of motion.     Right lower leg: No edema.     Left lower leg: No edema.  Skin:    General: Skin is warm and dry.  Neurological:     Comments: Patient is alert.  No signs of confusion.  Possible very subtle slur to speech.  No significant cranial nerve asymmetries.  Patient can follow commands appropriately.  Very subtle difference of strength testing left to right.  Psychiatric:        Mood and Affect: Mood normal.     ED Results / Procedures / Treatments   Labs (all labs ordered are listed, but only abnormal results are displayed) Labs Reviewed  CBC - Abnormal;  Notable for the following components:      Result Value   WBC 10.7 (*)    All other components within normal limits  DIFFERENTIAL - Abnormal; Notable for the following components:   Lymphs Abs 4.1 (*)    All other components within normal limits  COMPREHENSIVE METABOLIC PANEL - Abnormal; Notable for the following components:   BUN 24 (*)    Creatinine, Ser 2.44 (*)    GFR, Estimated 29 (*)    All other components within normal limits  URINALYSIS, ROUTINE W REFLEX MICROSCOPIC - Abnormal; Notable for the following components:   APPearance CLOUDY (*)    Leukocytes,Ua SMALL (*)    Bacteria, UA RARE (*)    All other components within normal limits  I-STAT CHEM 8, ED - Abnormal; Notable for the following components:   BUN 28 (*)    Creatinine, Ser 2.40 (*)    All other components within normal limits  CBG MONITORING, ED - Abnormal; Notable for the following components:   Glucose-Capillary 152 (*)    All other components within normal limits  URINE CULTURE  PROTIME-INR  APTT    EKG EKG Interpretation  Date/Time:  Friday March 10 2020 15:23:42 EST Ventricular Rate:  82 PR Interval:  132 QRS Duration: 92 QT Interval:  370 QTC Calculation: 432 R Axis:   47 Text Interpretation: Normal sinus rhythm Normal ECG agree, no sig change from previous Confirmed by Arby Barrette [phone]) on 03/10/2020 6:35:51 PM   Radiology CT HEAD WO CONTRAST  Result Date: 03/10/2020 CLINICAL DATA:  Slurred speech today. EXAM: CT HEAD WITHOUT CONTRAST TECHNIQUE: Contiguous axial images were obtained from the base of the skull through the vertex without intravenous contrast. COMPARISON:  Brain MRI 02/01/2020. FINDINGS: Brain: No evidence of acute infarction, hemorrhage, hydrocephalus, extra-axial collection or mass lesion/mass effect. Cortical atrophy and chronic microvascular ischemic change noted. Punctate left basal ganglia infarct is also seen as on the prior MRI. Vascular: No hyperdense vessel or  unexpected calcification. Skull: Intact.  No focal lesion. Sinuses/Orbits: Negative. Other: None. IMPRESSION: No acute intracranial abnormality. Atrophy, chronic microvascular ischemic change and small left basal ganglia infarct as seen on prior MRI. Electronically Signed   By: Drusilla Kanner M.D.   On: 03/10/2020 17:30    Procedures Procedures (including critical care time)  Medications Ordered in ED Medications  cephALEXin (KEFLEX) capsule 1,000 mg (has no administration in time range)  sodium chloride flush (NS) 0.9 % injection 3 mL (3 mLs Intravenous Given 03/10/20 1949)    ED Course  I have reviewed the  triage vital signs and the nursing notes.  Pertinent labs & imaging results that were available during my care of the patient were reviewed by me and considered in my medical decision making (see chart for details).    MDM Rules/Calculators/A&P                           Patient presents as outlined.  He is nontoxic and alert.  Clinically well in appearance.  Blood pressures were mildly soft on arrival.  Reported of systolic blood pressure in the 80s out at the [facility] outpatient clinic.  Patient had taken all of his medications today.  At this time, have suspicion that patient's blood pressure medications are resulting in hypotension and AKI.  It appears patient likely is more compliant since his CVA.  Plans had already been made for discontinue hydrochlorothiazide.  Also I have reviewed with the patient the importance of discontinue naproxen and all NSAIDs.  As well given blood pressures and AKI we will have the patient take lisinopril 10 mg daily rather than 30mg   and measure blood pressures carefully.  He is on Plavix and plans are in place for possible carotid endarterectomy at the end of the month.  No signs of acute or new stroke.  UA is testing mildly positive.  Will opt to treat with 21-50 white cells and good specimen.  Urine culture added.  I have reviewed this plan with the patient  and family members.  Return precautions reviewed.  Patient will need recheck of labs on Monday to follow renal function.  Final Clinical Impression(s) / ED Diagnoses Final diagnoses:  Weakness  Hypotension, unspecified hypotension type  Acute cystitis without hematuria  AKI (acute kidney injury) (HCC)    Rx / DC Orders ED Discharge Orders         Ordered    cephALEXin (KEFLEX) 500 MG capsule  2 times daily        03/10/20 2121           2122, MD 03/10/20 2133

## 2020-03-10 NOTE — ED Triage Notes (Signed)
Patient BIB by son-in-law from [facility] hospital. Per SIL patient hypotensive in 80s at [place]. SIL also noting slurred speech since he picked him up at 12:00. VSS. NAD.

## 2020-03-10 NOTE — Discharge Instructions (Addendum)
We have discussed medication changes on your medication sheet marked "new".  There were adjustments made by your provider today.  I am recommending additional adjustments as follows (these were marked onto your home medication sheet): NO NAPROXEN or other medications for pain in the category called 'NSAIDS". For example, Motrin (ibuprofen), meloxicam, Aleve (naproxen).  If you have any question, ask the pharmacist or your doctor.  You may safely take over-the-counter Tylenol (acetaminophen) per package instructions for pain if needed. Discontinue your hydrochlorothiazide (HCTZ) as indicated by your provider earlier today. I also suggested decreasing your daily lisinopril dose.  Instead of the 10 mg 3 times a day as listed on your sheet.  We discussed taking lisinopril 10 mg a day.  Measure your blood pressures 3 times a day.  The dose of lisinopril can be increased if needed. Do not take any of the sildenafil (Viagra).  Only resume this medication if deemed safe by your doctor after your blood pressures have stabilized on your medication changes.  Your urine test suggests mild urinary tract infection.  You have been started on an antibiotic called Keflex.  Take this twice daily as prescribed.  Your kidney numbers were mildly elevated today.  I suspect this is due to the medications of naproxen and hydrochlorothiazide.  It is very important that this is closely monitored with the medication changes made.  You should have a repeat blood draw done on Monday to monitor your kidney function and this needs to be reviewed by your doctor.  Return to the emergency department if you are not feeling well, develop fever, increasing weakness or other concerning symptoms.  Avoid all drug and alcohol use.  Any substances that change your behavior, speech and coordination can be confused for stroke symptoms, medication reactions or new illness.

## 2020-03-10 NOTE — ED Notes (Signed)
   03/10/20 1933  Orthostatic Lying   BP- Lying 104/60  Pulse- Lying 62  Orthostatic Sitting  BP- Sitting 106/71  Pulse- Sitting 72  Orthostatic Standing at 0 minutes  BP- Standing at 0 minutes 123/73  Pulse- Standing at 0 minutes 78  Orthostatic Standing at 3 minutes  BP- Standing at 3 minutes 131/82  Pulse- Standing at 3 minutes 90  Pt has no complaints at this time

## 2020-03-12 LAB — URINE CULTURE: Culture: NO GROWTH

## 2020-03-15 DIAGNOSIS — E785 Hyperlipidemia, unspecified: Secondary | ICD-10-CM | POA: Diagnosis not present

## 2020-03-15 DIAGNOSIS — I129 Hypertensive chronic kidney disease with stage 1 through stage 4 chronic kidney disease, or unspecified chronic kidney disease: Secondary | ICD-10-CM | POA: Diagnosis not present

## 2020-03-15 DIAGNOSIS — I6523 Occlusion and stenosis of bilateral carotid arteries: Secondary | ICD-10-CM | POA: Diagnosis not present

## 2020-03-15 DIAGNOSIS — Z7902 Long term (current) use of antithrombotics/antiplatelets: Secondary | ICD-10-CM | POA: Diagnosis not present

## 2020-03-15 DIAGNOSIS — R7303 Prediabetes: Secondary | ICD-10-CM | POA: Diagnosis not present

## 2020-03-15 DIAGNOSIS — I69351 Hemiplegia and hemiparesis following cerebral infarction affecting right dominant side: Secondary | ICD-10-CM | POA: Diagnosis not present

## 2020-03-15 DIAGNOSIS — I69322 Dysarthria following cerebral infarction: Secondary | ICD-10-CM | POA: Diagnosis not present

## 2020-03-15 DIAGNOSIS — N182 Chronic kidney disease, stage 2 (mild): Secondary | ICD-10-CM | POA: Diagnosis not present

## 2020-03-15 DIAGNOSIS — Z720 Tobacco use: Secondary | ICD-10-CM | POA: Diagnosis not present

## 2020-03-15 DIAGNOSIS — Z7982 Long term (current) use of aspirin: Secondary | ICD-10-CM | POA: Diagnosis not present

## 2020-03-15 DIAGNOSIS — Z9682 Presence of neurostimulator: Secondary | ICD-10-CM | POA: Diagnosis not present

## 2020-03-16 ENCOUNTER — Telehealth: Payer: Self-pay

## 2020-03-16 NOTE — Telephone Encounter (Signed)
Spoke with pt who inquired about his appt on tomorrow and when he will have surgery. Advised pt at this time he has a f/u appt only at 0945 and can discuss further tx plan with Dr. Randie Heinz at that time. Pt verbalized understanding.

## 2020-03-16 NOTE — Telephone Encounter (Signed)
Patient has left VM x2 to ask what he needs to do for his appt tomorrow. He has a follow up appt with [facility]. No studies. Just needs to show up at 0945. Attempted to call back, UTR or leave VM.

## 2020-03-17 ENCOUNTER — Encounter: Payer: Self-pay | Admitting: Vascular Surgery

## 2020-03-17 ENCOUNTER — Ambulatory Visit (INDEPENDENT_AMBULATORY_CARE_PROVIDER_SITE_OTHER): Payer: Medicare Other | Admitting: Vascular Surgery

## 2020-03-17 ENCOUNTER — Other Ambulatory Visit: Payer: Self-pay

## 2020-03-17 ENCOUNTER — Telehealth: Payer: Self-pay | Admitting: Internal Medicine

## 2020-03-17 VITALS — BP 137/77 | HR 71 | Temp 98.2°F | Resp 20 | Ht 67.0 in | Wt 195.0 lb

## 2020-03-17 DIAGNOSIS — Z9682 Presence of neurostimulator: Secondary | ICD-10-CM | POA: Diagnosis not present

## 2020-03-17 DIAGNOSIS — I69322 Dysarthria following cerebral infarction: Secondary | ICD-10-CM | POA: Diagnosis not present

## 2020-03-17 DIAGNOSIS — N182 Chronic kidney disease, stage 2 (mild): Secondary | ICD-10-CM | POA: Diagnosis not present

## 2020-03-17 DIAGNOSIS — I129 Hypertensive chronic kidney disease with stage 1 through stage 4 chronic kidney disease, or unspecified chronic kidney disease: Secondary | ICD-10-CM | POA: Diagnosis not present

## 2020-03-17 DIAGNOSIS — I6523 Occlusion and stenosis of bilateral carotid arteries: Secondary | ICD-10-CM | POA: Diagnosis not present

## 2020-03-17 DIAGNOSIS — I69351 Hemiplegia and hemiparesis following cerebral infarction affecting right dominant side: Secondary | ICD-10-CM | POA: Diagnosis not present

## 2020-03-17 DIAGNOSIS — E785 Hyperlipidemia, unspecified: Secondary | ICD-10-CM | POA: Diagnosis not present

## 2020-03-17 DIAGNOSIS — R7303 Prediabetes: Secondary | ICD-10-CM | POA: Diagnosis not present

## 2020-03-17 DIAGNOSIS — Z7982 Long term (current) use of aspirin: Secondary | ICD-10-CM | POA: Diagnosis not present

## 2020-03-17 DIAGNOSIS — Z720 Tobacco use: Secondary | ICD-10-CM | POA: Diagnosis not present

## 2020-03-17 DIAGNOSIS — Z7902 Long term (current) use of antithrombotics/antiplatelets: Secondary | ICD-10-CM | POA: Diagnosis not present

## 2020-03-17 NOTE — Progress Notes (Signed)
Patient ID: Alec Sanchez, male   DOB: Mar 01, 1955, 65 y.o.   MRN: [id number]  Reason for Consult: Follow-up   Referred by [facility], [facility]  Subjective:     HPI:  Alec Sanchez is a 65 y.o. male history of hypertension, hyperlipidemia diabetes and tobacco abuse.  Recently diagnosed with left-sided stroke and had a CTA that demonstrated a string sign on the right ICA as well as stenosis of the left.  Initially stroke gave him right-sided hand weakness and slurred speech which did improve during his hospitalization.  He was started on aspirin Plavix and statin.  Since his hospitalization he still has right arm weakness.  Occasional slurring of his words but has improved.  He has quit smoking entirely.  He recently was admitted with urinary tract infection.  He denies any cardiac issues does not have a cardiologist.  Past Medical History:  Diagnosis Date  . Carotid artery occlusion   . Stroke [facility])     Short Social History:  Social History   Tobacco Use  . Smoking status: Not on file  . Smokeless tobacco: Not on file  Substance Use Topics  . Alcohol use: Not on file    No Known Allergies  Current Outpatient Medications  Medication Sig Dispense Refill  . amLODipine (NORVASC) 10 MG tablet Take 10 mg by mouth daily.    Marland Kitchen aspirin EC 81 MG EC tablet Take 1 tablet (81 mg total) by mouth daily. Swallow whole. 30 tablet 11  . atorvastatin (LIPITOR) 80 MG tablet Take 80 mg by mouth daily.    . cephALEXin (KEFLEX) 500 MG capsule Take 2 capsules (1,000 mg total) by mouth 2 (two) times daily. 28 capsule 0  . Cholecalciferol 25 MCG (1000 UT) tablet Take 1,000 Units by mouth daily.    . clopidogrel (PLAVIX) 75 MG tablet Take 75 mg by mouth daily.    . ferrous sulfate 325 (65 FE) MG tablet Take 325 mg by mouth daily with breakfast.     No current facility-administered medications for this visit.    Review of Systems  Constitutional:  Constitutional negative. HENT: HENT negative.   Eyes: Eyes negative.  Cardiovascular: Cardiovascular negative.  GI: Gastrointestinal negative.  Skin: Skin negative.  Neurological: Positive for focal weakness and speech difficulty.  Psychiatric: Psychiatric negative.        Objective:  Objective   Vitals:   03/17/20 1002 03/17/20 1004  BP: 130/75 137/77  Pulse: 71   Resp: 20   Temp: 98.2 F (36.8 C)   SpO2: 97%     Physical Exam HENT:     Head: Normocephalic.     Nose:     Comments: Wearing a mask Eyes:     Pupils: Pupils are equal, round, and reactive to light.  Neck:     Vascular: No carotid bruit.  Cardiovascular:     Rate and Rhythm: Normal rate.     Pulses:          Radial pulses are 2+ on the right side and 2+ on the left side.       Dorsalis pedis pulses are 0 on the right side and 0 on the left side.       Posterior tibial pulses are 0 on the right side and 0 on the left side.     Heart sounds: Normal heart sounds.  Pulmonary:     Effort: Pulmonary effort is normal.     Breath sounds: Normal breath sounds.  Abdominal:  General: Abdomen is flat.     Palpations: Abdomen is soft. There is no mass.  Musculoskeletal:        General: Tenderness present. No swelling. Normal range of motion.  Skin:    General: Skin is warm and dry.  Neurological:     Mental Status: He is alert.     Motor: Weakness present.     Comments: Slurred speech  Psychiatric:        Mood and Affect: Mood normal.        Behavior: Behavior normal.        Thought Content: Thought content normal.        Judgment: Judgment normal.     Data: Carotid Duplex Summary:  Right Carotid: Velocities in the right ICA are consistent with a 80-99%         stenosis.   Left Carotid: Velocities in the left ICA are consistent with an upper  range        40-59% stenosis.   Vertebrals: Bilateral vertebral arteries demonstrate antegrade flow.  Subclavians: Normal flow hemodynamics were seen in bilateral subclavian         arteries.   CTA angio IMPRESSION: 1. 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 Probable retrograde supply to the right V4 segment, with moderate to severe stenosis of the vessel at the skull base.  2. Severe atherosclerosis in the head and neck with predominantly soft plaque. Significant stenoses include: - bulky soft plaque at the Right ICA bulb with stenosis approaching a RADIOGRAPHIC STRING SIGN (series 5, image 93). - 66% stenosis distal Left ICA bulb, although might be underestimated (same image). - up to moderate Left MCA M1 stenosis (series 10, image 20).  3. Involvement of the aortic arch including stringy plaque or thrombus in the distal arch (series 9, image 145). Aortic Atherosclerosis (ICD10-I70.0).  4. Widespread mild to moderate intracranial branch irregularity.     Assessment/Plan:     65 year old male recently had stroke that was thought not related to his carotid artery disease.  He does have a string sign on the right side and left side has moderate grade stenosis by both duplex and CT angio.  I discussed with him his options being medical therapy versus carotid endarterectomy versus stenting.  Given that this is a relatively high lesion with a small distal ICA we have discussed transcarotid artery stenting.  He will continue aspirin Plavix and statin.  We will get him evaluated by cardiology.  We will schedule him for right transcribed artery stenting after this wave of COVID-19.  I discussed with him the risk and benefits and alternatives and he demonstrates good understanding in the presence of his daughter.     Maeola Harman MD [facility]

## 2020-03-17 NOTE — H&P (View-Only) (Signed)
Patient ID: Alec Sanchez, male   DOB: Mar 01, 1955, 65 y.o.   MRN: [id number]  Reason for Consult: Follow-up   Referred by [facility], [facility]  Subjective:     HPI:  Alec Sanchez is a 65 y.o. male history of hypertension, hyperlipidemia diabetes and tobacco abuse.  Recently diagnosed with left-sided stroke and had a CTA that demonstrated a string sign on the right ICA as well as stenosis of the left.  Initially stroke gave him right-sided hand weakness and slurred speech which did improve during his hospitalization.  He was started on aspirin Plavix and statin.  Since his hospitalization he still has right arm weakness.  Occasional slurring of his words but has improved.  He has quit smoking entirely.  He recently was admitted with urinary tract infection.  He denies any cardiac issues does not have a cardiologist.  Past Medical History:  Diagnosis Date  . Carotid artery occlusion   . Stroke [facility])     Short Social History:  Social History   Tobacco Use  . Smoking status: Not on file  . Smokeless tobacco: Not on file  Substance Use Topics  . Alcohol use: Not on file    No Known Allergies  Current Outpatient Medications  Medication Sig Dispense Refill  . amLODipine (NORVASC) 10 MG tablet Take 10 mg by mouth daily.    Marland Kitchen aspirin EC 81 MG EC tablet Take 1 tablet (81 mg total) by mouth daily. Swallow whole. 30 tablet 11  . atorvastatin (LIPITOR) 80 MG tablet Take 80 mg by mouth daily.    . cephALEXin (KEFLEX) 500 MG capsule Take 2 capsules (1,000 mg total) by mouth 2 (two) times daily. 28 capsule 0  . Cholecalciferol 25 MCG (1000 UT) tablet Take 1,000 Units by mouth daily.    . clopidogrel (PLAVIX) 75 MG tablet Take 75 mg by mouth daily.    . ferrous sulfate 325 (65 FE) MG tablet Take 325 mg by mouth daily with breakfast.     No current facility-administered medications for this visit.    Review of Systems  Constitutional:  Constitutional negative. HENT: HENT negative.   Eyes: Eyes negative.  Cardiovascular: Cardiovascular negative.  GI: Gastrointestinal negative.  Skin: Skin negative.  Neurological: Positive for focal weakness and speech difficulty.  Psychiatric: Psychiatric negative.        Objective:  Objective   Vitals:   03/17/20 1002 03/17/20 1004  BP: 130/75 137/77  Pulse: 71   Resp: 20   Temp: 98.2 F (36.8 C)   SpO2: 97%     Physical Exam HENT:     Head: Normocephalic.     Nose:     Comments: Wearing a mask Eyes:     Pupils: Pupils are equal, round, and reactive to light.  Neck:     Vascular: No carotid bruit.  Cardiovascular:     Rate and Rhythm: Normal rate.     Pulses:          Radial pulses are 2+ on the right side and 2+ on the left side.       Dorsalis pedis pulses are 0 on the right side and 0 on the left side.       Posterior tibial pulses are 0 on the right side and 0 on the left side.     Heart sounds: Normal heart sounds.  Pulmonary:     Effort: Pulmonary effort is normal.     Breath sounds: Normal breath sounds.  Abdominal:  General: Abdomen is flat.     Palpations: Abdomen is soft. There is no mass.  Musculoskeletal:        General: Tenderness present. No swelling. Normal range of motion.  Skin:    General: Skin is warm and dry.  Neurological:     Mental Status: He is alert.     Motor: Weakness present.     Comments: Slurred speech  Psychiatric:        Mood and Affect: Mood normal.        Behavior: Behavior normal.        Thought Content: Thought content normal.        Judgment: Judgment normal.     Data: Carotid Duplex Summary:  Right Carotid: Velocities in the right ICA are consistent with a 80-99%         stenosis.   Left Carotid: Velocities in the left ICA are consistent with an upper  range        40-59% stenosis.   Vertebrals: Bilateral vertebral arteries demonstrate antegrade flow.  Subclavians: Normal flow hemodynamics were seen in bilateral subclavian         arteries.   CTA angio IMPRESSION: 1. 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 Probable retrograde supply to the right V4 segment, with moderate to severe stenosis of the vessel at the skull base.  2. Severe atherosclerosis in the head and neck with predominantly soft plaque. Significant stenoses include: - bulky soft plaque at the Right ICA bulb with stenosis approaching a RADIOGRAPHIC STRING SIGN (series 5, image 93). - 66% stenosis distal Left ICA bulb, although might be underestimated (same image). - up to moderate Left MCA M1 stenosis (series 10, image 20).  3. Involvement of the aortic arch including stringy plaque or thrombus in the distal arch (series 9, image 145). Aortic Atherosclerosis (ICD10-I70.0).  4. Widespread mild to moderate intracranial branch irregularity.     Assessment/Plan:     64-year-old male recently had stroke that was thought not related to his carotid artery disease.  He does have a string sign on the right side and left side has moderate grade stenosis by both duplex and CT angio.  I discussed with him his options being medical therapy versus carotid endarterectomy versus stenting.  Given that this is a relatively high lesion with a small distal ICA we have discussed transcarotid artery stenting.  He will continue aspirin Plavix and statin.  We will get him evaluated by cardiology.  We will schedule him for right transcribed artery stenting after this wave of COVID-19.  I discussed with him the risk and benefits and alternatives and he demonstrates good understanding in the presence of his daughter.     Geneveive Furness Christopher Cymone Yeske MD [facility] [place]  

## 2020-03-17 NOTE — Telephone Encounter (Signed)
Primary Cardiologist:No primary care provider on file.  Chart reviewed as part of pre-operative protocol coverage. Because of Alec Sanchez's past medical history and time since last visit, he/she will require a follow-up visit in order to better assess preoperative cardiovascular risk.  Pre-op covering staff: - Please schedule appointment and call patient to inform them. - Please contact requesting surgeon's office via preferred method (i.e, phone, fax) to inform them of need for appointment prior to surgery.  If applicable, this message will also be routed to pharmacy pool and/or primary cardiologist for input on holding anticoagulant/antiplatelet agent as requested below so that this information is available at time of patient's appointment.   [name], NP  03/17/2020, 1:20 PM

## 2020-03-17 NOTE — Telephone Encounter (Signed)
   [facility] Pre-operative Risk Assessment    HEARTCARE STAFF: - Please ensure there is not already an duplicate clearance open for this procedure. - Under Visit Info/Reason for Call, type in Other and utilize the format Clearance MM/DD/YY or Clearance TBD. Do not use dashes or single digits. - If request is for dental extraction, please clarify the # of teeth to be extracted.  Request for surgical clearance:  1. What type of surgery is being performed? TCAR right  2. When is this surgery scheduled? 04/12/2020  3. What type of clearance is required (medical clearance vs. Pharmacy clearance to hold med vs. Both)? Both  4. Are there any medications that need to be held prior to surgery and how long? Yes, not sure what   5. Practice name and name of physician performing surgery? [facility], Dr. Donzetta Matters  6. What is the office phone number? [phone]   7.   What is the office fax number? [phone]  8.   Anesthesia type (None, local, MAC, general) ? Does not know   Alec Sanchez 03/17/2020, 10:37 AM  _________________________________________________________________   (provider comments below)

## 2020-03-17 NOTE — Telephone Encounter (Signed)
Pt is scheduled to see Dr. Tenny Craw 03/21/2020 and clearance will be addressed at that time.  Will fax back to the requesting surgeon's office to make them aware.

## 2020-03-20 DIAGNOSIS — Z7982 Long term (current) use of aspirin: Secondary | ICD-10-CM | POA: Diagnosis not present

## 2020-03-20 DIAGNOSIS — I6523 Occlusion and stenosis of bilateral carotid arteries: Secondary | ICD-10-CM | POA: Diagnosis not present

## 2020-03-20 DIAGNOSIS — Z7902 Long term (current) use of antithrombotics/antiplatelets: Secondary | ICD-10-CM | POA: Diagnosis not present

## 2020-03-20 DIAGNOSIS — I129 Hypertensive chronic kidney disease with stage 1 through stage 4 chronic kidney disease, or unspecified chronic kidney disease: Secondary | ICD-10-CM | POA: Diagnosis not present

## 2020-03-20 DIAGNOSIS — I69351 Hemiplegia and hemiparesis following cerebral infarction affecting right dominant side: Secondary | ICD-10-CM | POA: Diagnosis not present

## 2020-03-20 DIAGNOSIS — E785 Hyperlipidemia, unspecified: Secondary | ICD-10-CM | POA: Diagnosis not present

## 2020-03-20 DIAGNOSIS — N182 Chronic kidney disease, stage 2 (mild): Secondary | ICD-10-CM | POA: Diagnosis not present

## 2020-03-20 DIAGNOSIS — I69322 Dysarthria following cerebral infarction: Secondary | ICD-10-CM | POA: Diagnosis not present

## 2020-03-20 DIAGNOSIS — Z9682 Presence of neurostimulator: Secondary | ICD-10-CM | POA: Diagnosis not present

## 2020-03-20 DIAGNOSIS — Z720 Tobacco use: Secondary | ICD-10-CM | POA: Diagnosis not present

## 2020-03-20 DIAGNOSIS — R7303 Prediabetes: Secondary | ICD-10-CM | POA: Diagnosis not present

## 2020-03-21 ENCOUNTER — Ambulatory Visit: Payer: Medicare Other | Admitting: Internal Medicine

## 2020-03-21 ENCOUNTER — Encounter: Payer: Self-pay | Admitting: Internal Medicine

## 2020-03-21 ENCOUNTER — Other Ambulatory Visit: Payer: Self-pay

## 2020-03-21 VITALS — BP 150/90 | HR 82 | Ht 67.0 in | Wt 196.8 lb

## 2020-03-21 DIAGNOSIS — Z01810 Encounter for preprocedural cardiovascular examination: Secondary | ICD-10-CM

## 2020-03-21 NOTE — Progress Notes (Unsigned)
Cardiology Office Note   Date:  03/21/2020   ID:  Alec, Sanchez 1955/11/07, MRN [id number]  PCP:  [facility]  Cardiologist:   Dietrich Pates, MD    Patient presents on referral for preop risk stratification   History of Present Illness: Alec Sanchez is a 65 y.o. male with a history of a HTN, HL, tobacco abuse  CVA and peripheral vascular disease.  In 2020 he suffered a CVA.   Was recovering in 2021.  Then on Dec 2021  he presented with right arm weakness and slurred speech.  CT head showed corona radiata hypodensity, small vessel dz and atrophy.  CTA showed severe atherosclerosis and string sign of R ICA.    MRI showed left basal ganglia/corona radiata infarct, punctate L periatrial white matter infarct, chronic lacunes/microhemorrhages He was seen by Mariel Craft at [facility].   Plan is for stenting.  The pt denies CP  Breathing is OK   He is not very active now given recnet CVA but last summer he was able to do some walking, could sweep without CP or signif SOB    Current Meds  Medication Sig  . amLODipine (NORVASC) 10 MG tablet Take 10 mg by mouth daily.  Marland Kitchen aspirin EC 81 MG EC tablet Take 1 tablet (81 mg total) by mouth daily. Swallow whole.  Marland Kitchen atorvastatin (LIPITOR) 80 MG tablet Take 80 mg by mouth daily.  . cephALEXin (KEFLEX) 500 MG capsule Take 2 capsules (1,000 mg total) by mouth 2 (two) times daily.  . Cholecalciferol 25 MCG (1000 UT) tablet Take 1,000 Units by mouth daily.  . ferrous sulfate 325 (65 FE) MG tablet Take 325 mg by mouth daily with breakfast.     Allergies:   Patient has no known allergies.   Past Medical History:  Diagnosis Date  . Carotid artery occlusion   . Hyperlipidemia   . Hypertension   . Stroke [facility])     Past Surgical History:  Procedure Laterality Date  . ANKLE SURGERY Left      Social History:  The patient  reports that he has never smoked. He has never used smokeless tobacco. He reports previous alcohol use. He reports that he does  not use drugs.   Family History:  The patient's family history is not on file.    ROS:  Please see the history of present illness. All other systems are reviewed and  Negative to the above problem except as noted.    PHYSICAL EXAM: VS:  BP (!) 150/90   Pulse 82   Ht 5\' 7"  (1.702 m)   Wt 196 lb 12.8 oz (89.3 kg)   SpO2 98%   BMI 30.82 kg/m   GEN: Well nourished, well developed, in no acute distress  HEENT: normal  Neck: no JVD, positive left bruit Cardiac: RRR; no murmurs.,no lower extremity edema  Respiratory:  clear to auscultation bilaterally, normal work of breathing GI: soft, nontender, nondistended, + BS  No hepatomegaly  MS: no deformity Moving all extremities   Skin: warm and dry, no rash Neuro:  Deferred  Psych: euthymic mood, full affect   EKG:  EKG is not ordered today.  EKG on March 13, 2020 showed sinus rhythm 82 bpm.  Nonspecific T wave changes   Lipid Panel    Component Value Date/Time   CHOL 203 (H) 02/01/2020 0246   TRIG 138 02/01/2020 0246   HDL 30 (L) 02/01/2020 0246   CHOLHDL 6.8 02/01/2020 0246   VLDL  28 02/01/2020 0246   LDLCALC 145 (H) 02/01/2020 0246      Wt Readings from Last 3 Encounters:  03/21/20 196 lb 12.8 oz (89.3 kg)  03/17/20 195 lb (88.5 kg)  01/31/20 207 lb 14.3 oz (94.3 kg)      ASSESSMENT AND PLAN:  1.  Preop risk stratification.  Patient is a 65 year old with known peripheral vascular disease, hypertension, dyslipidemia.  He is at mild  increased risk for cardiac complication in periop period with carotid stenting; mild to mod with CEA   He had no active angina.  He is in SR   Volume status is OK, no symptoms of CHF   Last summer he was doing over 4 METS of activity with significant prolem  .  Would not recomm further testing prior to procedure.  Cardiolgy will be available if problems develop Keep close eye on blood pressure and fluids throughout the procedure.  2 dyslipidemia.  Patient's last lipids in December, LDL was  145, HDL 30.  He is currently on atorvastatin.  Will need lipids in a couple months.  3 history of tobacco abuse.  Patient has quit.  4.  Hypertension blood pressure is higher today than it should be but with his severe carotid vessel disease I would not micromanage this.  He has appointment for procedure in February.  Pt will be set to come back in the summer after his surgery to eval responses to medical Rx   Current medicines are reviewed at length with the patient today.  The patient does not have concerns regarding medicines.  Signed, Dietrich Pates, MD  03/21/2020 3:27 PM    [facility] [address] [place], [place], [place]  [address] Phone: [phone]; Fax: [phone]

## 2020-03-21 NOTE — Patient Instructions (Signed)
Medication Instructions:  Your physician recommends that you continue on your current medications as directed. Please refer to the Current Medication list given to you today.  *If you need a refill on your cardiac medications before your next appointment, please call your pharmacy*  Follow-Up: At [facility], you and your health needs are our priority.  As part of our continuing mission to provide you with exceptional heart care, we have created designated Provider Care Teams.  These Care Teams include your primary Cardiologist (physician) and Advanced Practice Providers (APPs -  Physician Assistants and Nurse Practitioners) who all work together to provide you with the care you need, when you need it.  Your next appointment:   6 month(s)  The format for your next appointment:   In Person  Provider:   You may see Dietrich Pates, MD or one of the following Advanced Practice Providers on your designated Care Team:    Tereso Newcomer, PA-C  [place], [place]

## 2020-04-06 ENCOUNTER — Other Ambulatory Visit: Payer: Self-pay

## 2020-04-06 ENCOUNTER — Encounter (HOSPITAL_COMMUNITY): Payer: Self-pay

## 2020-04-06 ENCOUNTER — Encounter (HOSPITAL_COMMUNITY)
Admission: RE | Admit: 2020-04-06 | Discharge: 2020-04-06 | Disposition: A | Discharge status: Home / Self Care | Payer: Medicare Other | Source: Ambulatory Visit | Attending: Vascular Surgery | Admitting: Vascular Surgery

## 2020-04-06 DIAGNOSIS — Z7982 Long term (current) use of aspirin: Secondary | ICD-10-CM | POA: Insufficient documentation

## 2020-04-06 DIAGNOSIS — Z79899 Other long term (current) drug therapy: Secondary | ICD-10-CM | POA: Diagnosis not present

## 2020-04-06 DIAGNOSIS — Z8673 Personal history of transient ischemic attack (TIA), and cerebral infarction without residual deficits: Secondary | ICD-10-CM | POA: Diagnosis not present

## 2020-04-06 DIAGNOSIS — I10 Essential (primary) hypertension: Secondary | ICD-10-CM | POA: Diagnosis not present

## 2020-04-06 DIAGNOSIS — I6521 Occlusion and stenosis of right carotid artery: Secondary | ICD-10-CM | POA: Insufficient documentation

## 2020-04-06 DIAGNOSIS — Z01812 Encounter for preprocedural laboratory examination: Secondary | ICD-10-CM | POA: Diagnosis not present

## 2020-04-06 DIAGNOSIS — Z7901 Long term (current) use of anticoagulants: Secondary | ICD-10-CM | POA: Insufficient documentation

## 2020-04-06 DIAGNOSIS — Z7902 Long term (current) use of antithrombotics/antiplatelets: Secondary | ICD-10-CM | POA: Insufficient documentation

## 2020-04-06 DIAGNOSIS — E785 Hyperlipidemia, unspecified: Secondary | ICD-10-CM | POA: Insufficient documentation

## 2020-04-06 HISTORY — DX: Depression, unspecified: F32.A

## 2020-04-06 HISTORY — DX: Anxiety disorder, unspecified: F41.9

## 2020-04-06 LAB — COMPREHENSIVE METABOLIC PANEL
ALT: 19 U/L (ref 0–44)
AST: 16 U/L (ref 15–41)
Albumin: 4.1 g/dL (ref 3.5–5.0)
Alkaline Phosphatase: 67 U/L (ref 38–126)
Anion gap: 10 (ref 5–15)
BUN: 14 mg/dL (ref 8–23)
CO2: 25 mmol/L (ref 22–32)
Calcium: 9.1 mg/dL (ref 8.9–10.3)
Chloride: 106 mmol/L (ref 98–111)
Creatinine, Ser: 1.26 mg/dL — ABNORMAL HIGH (ref 0.61–1.24)
GFR, Estimated: >60 mL/min (ref >60)
Glucose, Bld: 89 mg/dL (ref 70–99)
Potassium: 3.7 mmol/L (ref 3.5–5.1)
Sodium: 141 mmol/L (ref 135–145)
Total Bilirubin: 0.4 mg/dL (ref 0.3–1.2)
Total Protein: 7.5 g/dL (ref 6.5–8.1)

## 2020-04-06 LAB — CBC
HCT: 38.9 % — ABNORMAL LOW (ref 39.0–52.0)
Hemoglobin: 13 g/dL (ref 13.0–17.0)
MCH: 31 pg (ref 26.0–34.0)
MCHC: 33.4 g/dL (ref 30.0–36.0)
MCV: 92.8 fL (ref 80.0–100.0)
Platelets: 354 10*3/uL (ref 150–400)
RBC: 4.19 MIL/uL — ABNORMAL LOW (ref 4.22–5.81)
RDW: 14 % (ref 11.5–15.5)
WBC: 10 10*3/uL (ref 4.0–10.5)
nRBC: 0 % (ref 0.0–0.2)

## 2020-04-06 LAB — PROTIME-INR
INR: 1.1 (ref 0.8–1.2)
Prothrombin Time: 13.3 seconds (ref 11.4–15.2)

## 2020-04-06 LAB — URINALYSIS, ROUTINE W REFLEX MICROSCOPIC
Bilirubin Urine: NEGATIVE
Glucose, UA: NEGATIVE mg/dL
Ketones, ur: 5 mg/dL — AB
Nitrite: NEGATIVE
Protein, ur: NEGATIVE mg/dL
RBC / HPF: >50 RBC/hpf — ABNORMAL HIGH (ref 0–5)
Specific Gravity, Urine: 1.026 (ref 1.005–1.030)
pH: 5 (ref 5.0–8.0)

## 2020-04-06 LAB — SURGICAL PCR SCREEN
MRSA, PCR: NEGATIVE
Staphylococcus aureus: NEGATIVE

## 2020-04-06 LAB — APTT: aPTT: 32 seconds (ref 24–36)

## 2020-04-06 NOTE — Pre-Procedure Instructions (Addendum)
Surgical Instructions    Your procedure is scheduled on Wednesday, April 12, 2020 from 8:30AM - 10:20AM.  Report to [facility] Main Entrance "A" at 6:30 A.M., then check in with the Admitting office.  Call this number if you have problems the morning of surgery:  [phone]   If you have any questions prior to your surgery date call [phone]: Open Monday-Friday 8am-4pm    Remember:  Do not eat or drink after midnight the night before your surgery    Take these medicines the morning of surgery with A SIP OF WATER: amLODipine (NORVASC) aspirin  cephALEXin (KEFLEX) PARoxetine (PAXIL)   Follow your surgeon's instructions on when to stop Plavix.  If no instructions were given by your surgeon then you will need to call the office to get those instructions.    As of today, STOP taking any Aleve, Naproxen, Ibuprofen, Motrin, Advil, Goody's, BC's, all herbal medications, fish oil, and all vitamins.                     Do not wear jewelry.            Do not wear lotions, powders, colognes, or deodorant.            Do not shave 48 hours prior to surgery.  Men may shave face and neck.            Do not bring valuables to the hospital.            [facility] is not responsible for any belongings or valuables.  Do NOT Smoke (Tobacco/Vaping) or drink Alcohol 24 hours prior to your procedure If you use a CPAP at night, you may bring all equipment for your overnight stay.   Contacts, glasses, dentures or bridgework may not be worn into surgery, please bring cases for these belongings   For patients admitted to the hospital, discharge time will be determined by your treatment team.   Patients discharged the day of surgery will not be allowed to drive home, and someone needs to stay with them for 24 hours.    Special instructions:   [facility]- Preparing For Surgery  Before surgery, you can play an important role. Because skin is not sterile, your skin needs to be as free of germs  as possible. You can reduce the number of germs on your skin by washing with CHG (chlorahexidine gluconate) Soap before surgery.  CHG is an antiseptic cleaner which kills germs and bonds with the skin to continue killing germs even after washing.    Oral Hygiene is also important to reduce your risk of infection.  Remember - BRUSH YOUR TEETH THE MORNING OF SURGERY WITH YOUR REGULAR TOOTHPASTE  Please do not use if you have an allergy to CHG or antibacterial soaps. If your skin becomes reddened/irritated stop using the CHG.  Do not shave (including legs and underarms) for at least 48 hours prior to first CHG shower. It is OK to shave your face.  Please follow these instructions carefully.   1. Shower the NIGHT BEFORE SURGERY and the MORNING OF SURGERY  2. If you chose to wash your hair, wash your hair first as usual with your normal shampoo.  3. After you shampoo, rinse your hair and body thoroughly to remove the shampoo.  4. Wash Face and genitals (private parts) with your normal soap.   5.  Shower the NIGHT BEFORE SURGERY and the MORNING OF SURGERY with CHG Soap.  6. Use CHG Soap as you would any other liquid soap. You can apply CHG directly to the skin and wash gently with a scrungie or a clean washcloth.   7. Apply the CHG Soap to your body ONLY FROM THE NECK DOWN.  Do not use on open wounds or open sores. Avoid contact with your eyes, ears, mouth and genitals (private parts). Wash Face and genitals (private parts)  with your normal soap.   8. Wash thoroughly, paying special attention to the area where your surgery will be performed.  9. Thoroughly rinse your body with warm water from the neck down.  10. DO NOT shower/wash with your normal soap after using and rinsing off the CHG Soap.  11. Pat yourself dry with a CLEAN TOWEL.  12. Wear CLEAN PAJAMAS to bed the night before surgery  13. Place CLEAN SHEETS on your bed the night before your surgery  14. DO NOT SLEEP WITH  PETS.   Day of Surgery: Wear Clean/Comfortable clothing the morning of surgery Do not apply any deodorants/lotions.   Remember to brush your teeth WITH YOUR REGULAR TOOTHPASTE.   Please read over the following fact sheets that you were given.

## 2020-04-06 NOTE — Progress Notes (Signed)
PCP - Dr. Willa Rough at [facility] clinic Cardiologist - Dr. Dietrich Pates  PPM/ICD - Denies  Chest x-ray - N/A EKG - 03/13/20 Stress Test - Denies ECHO - 02/01/20 Cardiac Cath - Denies  Sleep Study - Denies  Patient denies having diabetes.  Blood Thinner Instructions: LD Plavix 5 days prior to prcedure. (04/07/2020) Aspirin Instructions: N/A  ERAS Protcol - No   COVID TEST- 04/08/20   Anesthesia review: Yes, cardiac clearance in Epic 03/21/20  Patient denies shortness of breath, fever, cough and chest pain at PAT appointment   All instructions explained to the patient, with a verbal understanding of the material. Patient agrees to go over the instructions while at home for a better understanding. Patient also instructed to self quarantine after being tested for COVID-19. The opportunity to ask questions was provided.

## 2020-04-06 NOTE — Pre-Procedure Instructions (Signed)
Alec Sanchez  04/06/2020    Your procedure is scheduled on February 16.  Report to [facility], Main Entrance or Entrance "A" at 6:30 AM                Your surgery or procedure is scheduled to begin at 8:30 AM   Call this number if you have problems the morning of surgery: [phone]  This is the number for the Pre- Surgical Desk.                For any other questions, please call [phone], Monday - Friday 8 AM - 4 PM.   Remember:  Do not eat or drink after midnight Tuesday, 04/11/20.   Take these medicines the morning of surgery with A SIP OF WATER:  amLODipine (NORVASC)  aspirin PARoxetine (PAXIL)   1 Week prior to surgery STOP taking Aspirin, Aspirin Products (Goody Powder, Excedrin Migraine), Ibuprofen (Advil), Naproxen (Aleve), Vitamins and Herbal Products (ie Fish Oil).   Follow Dr. Darcella Cheshire instructions regarding clopidogrel (PLAVIX)    Special instructions:  If you smoke: Do not smoke within 24 hours of surgery.                 If you have a CPAP, please bring the mask with you to the hospital.    [facility]- Preparing For Surgery  Before surgery, you can play an important role. Because skin is not sterile, your skin needs to be as free of germs as possible. You can reduce the number of germs on your skin by washing with CHG (chlorahexidine gluconate) Soap before surgery.  CHG is an antiseptic cleaner which kills germs and bonds with the skin to continue killing germs even after washing.    Oral Hygiene is also important to reduce your risk of infection.  Remember - BRUSH YOUR TEETH THE MORNING OF SURGERY WITH YOUR REGULAR TOOTHPASTE  Please do not use if you have an allergy to CHG or antibacterial soaps. If your skin becomes reddened/irritated stop using the CHG.  Do not shave (including legs and underarms) for at least 48 hours prior to first CHG shower. It is OK to shave your face.  Please follow these instructions carefully.   1. Shower the  NIGHT BEFORE SURGERY and the MORNING OF SURGERY with CHG.   2. If you chose to wash your hair, wash your hair first as usual with your normal shampoo.  3. After you shampoo, wash your face and private area with the soap you use at home, then rinse your hair and body thoroughly to remove the shampoo and soap.  4. Use CHG as you would any other liquid soap. You can apply CHG directly to the skin and wash gently with a scrungie or a clean washcloth.   5. Apply the CHG Soap to your body ONLY FROM THE NECK DOWN.  Do not use on open wounds or open sores. Avoid contact with your eyes, ears, mouth and genitals (private parts).   6. Wash thoroughly, paying special attention to the area where your surgery will be performed.  7. Thoroughly rinse your body with warm water from the neck down.  8. DO NOT shower/wash with your normal soap after using and rinsing off the CHG Soap.  9. Pat yourself dry with a CLEAN TOWEL.  10. Wear CLEAN PAJAMAS to bed the night before surgery, wear comfortable clothes the morning of surgery  11. Place CLEAN SHEETS on your bed the night  of your first shower and DO NOT SLEEP WITH PETS.  Day of Surgery: Shower as instructed above. Do not apply any deodorants/lotions, powders or colognes.  Please wear clean clothes to the hospital/surgery center.   Remember to brush your teeth WITH YOUR REGULAR TOOTHPASTE.  Do not wear jewelry, make-up or nail polish.   Men may shave face and neck.  Do not bring valuables to the hospital.  [facility] is not responsible for any belongings or valuables.  Contacts, dentures or bridgework may not be worn into surgery.  Leave your suitcase in the car.  After surgery it may be brought to your room.  For patients admitted to the hospital, discharge time will be determined by your treatment team.  Patients discharged the day of surgery will not be allowed to drive home.   Please read over the fact sheets that you were  given.

## 2020-04-07 NOTE — Progress Notes (Signed)
Anesthesia Chart Review:  Case: [id number] Date/Time: 04/12/20 0815   Procedure: RIGHT TRANSCAROTID ARTERY REVASCULARIZATION (Right )   Anesthesia type: General   Pre-op diagnosis: Carotid Artery Stenosis   Location: [facility] ROOM 16 / [facility] OR   Surgeons: Maeola Harman, MD      DISCUSSION: Patient is a 65 year old male scheduled for the above procedure.  History includes never smoker, carotid artery disease, CVA (~ 04/2018; 01/2020, residual right sided weakness), HTN, HLD, depression, anxiety.  - ED visit 03/10/20 for hypotension (SBP 80's) at the [facility] clinic with question of worsening slurred speech. No acute findings on head CT. Cr 2.44 (up from 1.43 on 02/02/20) and felt to have AKI related to hypotension. HCTZ and NSAIDS discontinued. Lisinopril dose decreased to 10 mg daily. Keflex prescribed for mildly positive UA (culture ended up showing no growth).  - Admission 01/31/20-02/03/20 for ischemic stroke with right sided weakness and slurred speech x3 days. (He had initially refused to go to the ED for evaluation.). Work-up showed string sign RICA. Vascular surgeon and neurology consulted. Echo showed LVEF 55-60%, grade 1 DD, mild aortic valve sclerosis without stenosis.  He did have intermittent SB with a 2-3 second pause at night, but asymptomatic. Undiagnosed OSA questioned. Creatinine ~ 1.4 range, unsure of usual baseline but remained stable. A1c 5.7%. Discharge medications included ASA, Plavix, and statin with plans for vascular surgery out-patient follow-up for right CEA versus right TCAR. (Per 03/17/20 office note by Dr. Randie Heinz, "Given that this is a relatively high lesion with a small distal ICA we have discussed transcarotid artery stenting.  He will continue aspirin Plavix and statin.")  Preoperative risk stratification on 03/21/20 per cardiologist Dr. Tenny Craw: "Preop risk stratification.  Patient is a 65 year old with known peripheral vascular disease, hypertension, dyslipidemia.  He is at  mild  increased risk for cardiac complication in periop period with carotid stenting; mild to mod with CEA   He had no active angina.  He is in SR   Volume status is OK, no symptoms of CHF   Last summer he was doing over 4 METS of activity with significant prolem  .  Would not recomm further testing prior to procedure.  Cardiolgy will be available if problems develop Keep close eye on blood pressure and fluids throughout the procedure."  I called patient. Apparently, there is confusion of whether he was to hold on continue Plavix. I told him that based on notes by Dr. Randie Heinz, he was to continue ASA, Plavix, and statin. He requested staff contact his daughter [name] to clarify. I have reached out to [facility] RN to call his daughter to confirm instructions. I also communicated UA results--patient denied dysuria and says he has 4 pills left of Keflex from his current prescription for possible UTI last month. RN Trinna Post says Dr. Randie Heinz was reviewed UA results, and she will contact patient's daughter to confirm continuing ASA, Plavix, and statin.  Preoperative COVID-19 test is scheduled for 04/08/20. Anesthesia team to evaluate on the day of surgery.   VS: BP (!) 171/82   Pulse 65   Temp (!) 36.4 C (Oral)   Resp 19   Ht 5\' 7"  (1.702 m)   Wt 88 kg   SpO2 100%   BMI 30.40 kg/m     PROVIDERS: Clinic, is PCP ("Dr. Lenn Sink" at [facility]) [facility], MD is cardiologist   LABS: Labs reviewed: Acceptable for surgery. (all labs ordered are listed, but only abnormal results are displayed)  Labs Reviewed  CBC - Abnormal; Notable for the following components:      Result Value   RBC 4.19 (*)    HCT 38.9 (*)    All other components within normal limits  COMPREHENSIVE METABOLIC PANEL - Abnormal; Notable for the following components:   Creatinine, Ser 1.26 (*)    All other components within normal limits  URINALYSIS, ROUTINE W REFLEX MICROSCOPIC - Abnormal; Notable for the following  components:   APPearance HAZY (*)    Hgb urine dipstick LARGE (*)    Ketones, ur 5 (*)    Leukocytes,Ua MODERATE (*)    RBC / HPF >50 (*)    Bacteria, UA RARE (*)    All other components within normal limits  SURGICAL PCR SCREEN  PROTIME-INR  APTT     IMAGES: CT head 03/10/20: IMPRESSION: - No acute intracranial abnormality. - Atrophy, chronic microvascular ischemic change and small left basal ganglia infarct as seen on prior MRI.  MRI Brain 02/01/20: IMPRESSION: 1. Acute left basal ganglia/corona radiata infarct. 2. Punctate acute left periatrial white matter infarct. 3. Moderate chronic small vessel ischemic disease with chronic lacunar infarcts and microhemorrhages.  CTA head/neck 01/31/20: IMPRESSION: 1. 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 Probable retrograde supply to the right V4 segment, with moderate to severe stenosis of the vessel at the skull base. 2. Severe atherosclerosis in the head and neck with predominantly soft plaque. Significant stenoses include: - bulky soft plaque at the Right ICA bulb with stenosis approaching a RADIOGRAPHIC STRING SIGN (series 5, image 93). - 66% stenosis distal Left ICA bulb, although might be underestimated (same image). - up to moderate Left MCA M1 stenosis (series 10, image 20). 3. Involvement of the aortic arch including stringy plaque or thrombus in the distal arch (series 9, image 145). Aortic Atherosclerosis (ICD10-I70.0). 4. Widespread mild to moderate intracranial branch irregularity.   EKG: 03/10/20: Normal sinus rhythm Normal ECG agree, no sig change from previous Confirmed by Arby Barrette [phone]) on 03/10/2020 6:35:51 PM   CV: Echo 02/01/20: IMPRESSIONS  1. Left ventricular ejection fraction, by estimation, is 55 to 60%. The  left ventricle has normal function. The left ventricle has no regional  wall motion abnormalities. Left  ventricular diastolic parameters are  consistent with Grade I diastolic  dysfunction (impaired relaxation).  2. Right ventricular systolic function is normal. The right ventricular  size is normal.  3. The mitral valve is normal in structure. Trivial mitral valve  regurgitation. No evidence of mitral stenosis.  4. The aortic valve is normal in structure. Aortic valve regurgitation is  not visualized. Mild aortic valve sclerosis is present, with no evidence  of aortic valve stenosis.  5. The inferior vena cava is normal in size with greater than 50%  respiratory variability, suggesting right atrial pressure of 3 mmHg.    Carotid US 02/01/20: Summary:  - Right Carotid: Velocities in the right ICA are consistent with a 80-99% stenosis.  - Left Carotid: Velocities in the left ICA are consistent with an upper range 40-59% stenosis.  - Vertebrals: Bilateral vertebral arteries demonstrate antegrade flow.  - Subclavians: Normal flow hemodynamics were seen in bilateral subclavian arteries.    Past Medical History:  Diagnosis Date  . Anxiety   . Carotid artery occlusion   . Depression   . Hyperlipidemia   . Hypertension   . Stroke [facility])     Past Surgical History:  Procedure Laterality  Date  . ANKLE SURGERY Left     MEDICATIONS: . amLODipine (NORVASC) 10 MG tablet  . aspirin EC 81 MG EC tablet  . atorvastatin (LIPITOR) 80 MG tablet  . cephALEXin (KEFLEX) 500 MG capsule  . Cholecalciferol 25 MCG (1000 UT) tablet  . clopidogrel (PLAVIX) 75 MG tablet  . ferrous sulfate 325 (65 FE) MG tablet  . lisinopril (ZESTRIL) 10 MG tablet  . nystatin (MYCOSTATIN) 100000 UNIT/ML suspension  . PARoxetine (PAXIL) 40 MG tablet  . prazosin (MINIPRESS) 1 MG capsule   No current facility-administered medications for this encounter.     Shonna Chock, PA-C Surgical Short Stay/Anesthesiology [facility] Phone [phone] [facility] Phone [phone] 04/07/2020 11:31 AM

## 2020-04-08 ENCOUNTER — Other Ambulatory Visit (HOSPITAL_COMMUNITY)
Admission: RE | Admit: 2020-04-08 | Discharge: 2020-04-08 | Disposition: A | Discharge status: Home / Self Care | Payer: Medicare Other | Source: Ambulatory Visit | Attending: Vascular Surgery | Admitting: Vascular Surgery

## 2020-04-08 DIAGNOSIS — Z20822 Contact with and (suspected) exposure to covid-19: Secondary | ICD-10-CM | POA: Diagnosis not present

## 2020-04-08 DIAGNOSIS — Z01812 Encounter for preprocedural laboratory examination: Secondary | ICD-10-CM | POA: Insufficient documentation

## 2020-04-08 LAB — SARS CORONAVIRUS 2 (TAT 6-24 HRS): SARS Coronavirus 2: NEGATIVE

## 2020-04-11 NOTE — Anesthesia Preprocedure Evaluation (Addendum)
Anesthesia Evaluation  Patient identified by MRN, date of birth, ID band Patient awake    Reviewed: Allergy & Precautions, NPO status , Patient's Chart, lab work & pertinent test results  Airway Mallampati: II  TM Distance: >3 FB Neck ROM: Full    Dental  (+) Dental Advisory Given, Edentulous Lower, Edentulous Upper   Pulmonary former smoker,    Pulmonary exam normal breath sounds clear to auscultation       Cardiovascular hypertension, Pt. on medications + Peripheral Vascular Disease  Normal cardiovascular exam Rhythm:Regular Rate:Normal  Echo 02/01/20: 1. Left ventricular ejection fraction, by estimation, is 55 to 60%. The  left ventricle has normal function. The left ventricle has no regional  wall motion abnormalities. Left ventricular diastolic parameters are  consistent with Grade I diastolic  dysfunction (impaired relaxation).  2. Right ventricular systolic function is normal. The right ventricular  size is normal.  3. The mitral valve is normal in structure. Trivial mitral valve  regurgitation. No evidence of mitral stenosis.  4. The aortic valve is normal in structure. Aortic valve regurgitation is  not visualized. Mild aortic valve sclerosis is present, with no evidence  of aortic valve stenosis.  5. The inferior vena cava is normal in size with greater than 50%  respiratory variability, suggesting right atrial pressure of 3 mmHg.    Neuro/Psych PSYCHIATRIC DISORDERS Anxiety Depression Residual Symptoms    GI/Hepatic negative GI ROS, Neg liver ROS,   Endo/Other  Obesity   Renal/GU negative Renal ROS     Musculoskeletal negative musculoskeletal ROS (+)   Abdominal   Peds  Hematology  (+) Blood dyscrasia (Plavix), ,   Anesthesia Other Findings   Reproductive/Obstetrics                            Anesthesia Physical Anesthesia Plan  ASA: III  Anesthesia Plan: General    Post-op Pain Management:    Induction: Intravenous  PONV Risk Score and Plan: 2 and Midazolam, Dexamethasone and Ondansetron  Airway Management Planned: Oral ETT  Additional Equipment: Arterial line  Intra-op Plan:   Post-operative Plan: Extubation in OR  Informed Consent: I have reviewed the patients History and Physical, chart, labs and discussed the procedure including the risks, benefits and alternatives for the proposed anesthesia with the patient or authorized representative who has indicated his/her understanding and acceptance.     Dental advisory given  Plan Discussed with: CRNA  Anesthesia Plan Comments: (2nd PIV)       Anesthesia Quick Evaluation

## 2020-04-12 ENCOUNTER — Encounter (HOSPITAL_COMMUNITY)
Admission: RE | Disposition: A | Discharge status: Home / Self Care | Payer: Self-pay | Source: Home / Self Care | Attending: Vascular Surgery

## 2020-04-12 ENCOUNTER — Telehealth: Payer: Self-pay

## 2020-04-12 ENCOUNTER — Encounter (HOSPITAL_COMMUNITY): Payer: Self-pay | Admitting: Vascular Surgery

## 2020-04-12 ENCOUNTER — Inpatient Hospital Stay (HOSPITAL_COMMUNITY): Payer: Medicare Other

## 2020-04-12 ENCOUNTER — Inpatient Hospital Stay (HOSPITAL_COMMUNITY): Payer: Medicare Other | Admitting: Vascular Surgery

## 2020-04-12 ENCOUNTER — Other Ambulatory Visit: Payer: Self-pay

## 2020-04-12 ENCOUNTER — Inpatient Hospital Stay (HOSPITAL_COMMUNITY): Payer: Medicare Other | Admitting: Anesthesiology

## 2020-04-12 ENCOUNTER — Inpatient Hospital Stay (HOSPITAL_COMMUNITY)
Admission: RE | Admit: 2020-04-12 | Discharge: 2020-04-13 | DRG: 036 | Disposition: A | Discharge status: Home / Self Care | Payer: Medicare Other | Attending: Vascular Surgery | Admitting: Vascular Surgery

## 2020-04-12 DIAGNOSIS — Z7902 Long term (current) use of antithrombotics/antiplatelets: Secondary | ICD-10-CM | POA: Diagnosis not present

## 2020-04-12 DIAGNOSIS — I6521 Occlusion and stenosis of right carotid artery: Principal | ICD-10-CM | POA: Diagnosis present

## 2020-04-12 DIAGNOSIS — I6529 Occlusion and stenosis of unspecified carotid artery: Secondary | ICD-10-CM | POA: Diagnosis present

## 2020-04-12 DIAGNOSIS — Z419 Encounter for procedure for purposes other than remedying health state, unspecified: Secondary | ICD-10-CM

## 2020-04-12 DIAGNOSIS — I10 Essential (primary) hypertension: Secondary | ICD-10-CM | POA: Diagnosis not present

## 2020-04-12 DIAGNOSIS — E785 Hyperlipidemia, unspecified: Secondary | ICD-10-CM | POA: Diagnosis present

## 2020-04-12 DIAGNOSIS — Z4682 Encounter for fitting and adjustment of non-vascular catheter: Secondary | ICD-10-CM | POA: Diagnosis not present

## 2020-04-12 DIAGNOSIS — I69331 Monoplegia of upper limb following cerebral infarction affecting right dominant side: Secondary | ICD-10-CM

## 2020-04-12 DIAGNOSIS — Z79899 Other long term (current) drug therapy: Secondary | ICD-10-CM | POA: Diagnosis not present

## 2020-04-12 DIAGNOSIS — E669 Obesity, unspecified: Secondary | ICD-10-CM | POA: Diagnosis not present

## 2020-04-12 DIAGNOSIS — Z6830 Body mass index (BMI) 30.0-30.9, adult: Secondary | ICD-10-CM | POA: Diagnosis not present

## 2020-04-12 DIAGNOSIS — Z7982 Long term (current) use of aspirin: Secondary | ICD-10-CM

## 2020-04-12 DIAGNOSIS — E1151 Type 2 diabetes mellitus with diabetic peripheral angiopathy without gangrene: Secondary | ICD-10-CM | POA: Diagnosis not present

## 2020-04-12 DIAGNOSIS — T81538A Perforation due to foreign body accidentally left in body following other procedure, initial encounter: Secondary | ICD-10-CM | POA: Diagnosis not present

## 2020-04-12 DIAGNOSIS — Z87891 Personal history of nicotine dependence: Secondary | ICD-10-CM | POA: Diagnosis not present

## 2020-04-12 HISTORY — PX: ULTRASOUND GUIDANCE FOR VASCULAR ACCESS: SHX6516

## 2020-04-12 HISTORY — PX: TRANSCAROTID ARTERY REVASCULARIZATIONÂ: SHX6778

## 2020-04-12 LAB — CBC
HCT: 39.8 % (ref 39.0–52.0)
Hemoglobin: 13.5 g/dL (ref 13.0–17.0)
MCH: 31 pg (ref 26.0–34.0)
MCHC: 33.9 g/dL (ref 30.0–36.0)
MCV: 91.3 fL (ref 80.0–100.0)
Platelets: 333 10*3/uL (ref 150–400)
RBC: 4.36 MIL/uL (ref 4.22–5.81)
RDW: 14 % (ref 11.5–15.5)
WBC: 11.7 10*3/uL — ABNORMAL HIGH (ref 4.0–10.5)
nRBC: 0 % (ref 0.0–0.2)

## 2020-04-12 LAB — CREATININE, SERUM
Creatinine, Ser: 1.13 mg/dL (ref 0.61–1.24)
GFR, Estimated: >60 mL/min (ref >60)

## 2020-04-12 SURGERY — TRANSCAROTID ARTERY REVASCULARIZATION (TCAR)
Anesthesia: General | Site: Neck | Laterality: Right

## 2020-04-12 MED ORDER — ACETAMINOPHEN 650 MG RE SUPP: 325.0000 mg | RECTAL | Status: DC | PRN

## 2020-04-12 MED ORDER — ONDANSETRON HCL 4 MG/2ML IJ SOLN: 4.0000 mg | Freq: Four times a day (QID) | INTRAMUSCULAR | Status: DC | PRN

## 2020-04-12 MED ORDER — SODIUM CHLORIDE 0.9 % IV SOLN
INTRAVENOUS | Status: DC | PRN
  Administered 2020-04-12: 500 mL

## 2020-04-12 MED ORDER — FENTANYL CITRATE (PF) 250 MCG/5ML IJ SOLN
INTRAMUSCULAR | Status: AC
  Filled 2020-04-12: qty 5

## 2020-04-12 MED ORDER — FENTANYL CITRATE (PF) 100 MCG/2ML IJ SOLN: 25.0000 ug | INTRAMUSCULAR | Status: DC | PRN

## 2020-04-12 MED ORDER — SODIUM CHLORIDE 0.9 % IV SOLN: INTRAVENOUS | Status: DC

## 2020-04-12 MED ORDER — HEMOSTATIC AGENTS (NO CHARGE) OPTIME
TOPICAL | Status: DC | PRN
  Administered 2020-04-12: 1 via TOPICAL

## 2020-04-12 MED ORDER — LABETALOL HCL 5 MG/ML IV SOLN: 10.0000 mg | INTRAVENOUS | Status: DC | PRN

## 2020-04-12 MED ORDER — GUAIFENESIN-DM 100-10 MG/5ML PO SYRP: 15.0000 mL | ORAL_SOLUTION | ORAL | Status: DC | PRN

## 2020-04-12 MED ORDER — SODIUM CHLORIDE 0.9 % IV SOLN: 500.0000 mL | Freq: Once | INTRAVENOUS | Status: DC | PRN

## 2020-04-12 MED ORDER — AMLODIPINE BESYLATE 10 MG PO TABS
10.0000 mg | ORAL_TABLET | Freq: Every day | ORAL | Status: DC
  Administered 2020-04-13: 10 mg via ORAL
  Filled 2020-04-12: qty 1

## 2020-04-12 MED ORDER — GLYCOPYRROLATE PF 0.2 MG/ML IJ SOSY
PREFILLED_SYRINGE | INTRAMUSCULAR | Status: AC
  Filled 2020-04-12: qty 1

## 2020-04-12 MED ORDER — LABETALOL HCL 5 MG/ML IV SOLN
10.0000 mg | Freq: Once | INTRAVENOUS | Status: AC
  Administered 2020-04-12: 10 mg via INTRAVENOUS

## 2020-04-12 MED ORDER — CEPHALEXIN 500 MG PO CAPS
1000.0000 mg | ORAL_CAPSULE | Freq: Two times a day (BID) | ORAL | Status: DC
  Administered 2020-04-12 – 2020-04-13 (×2): 1000 mg via ORAL
  Filled 2020-04-12 (×2): qty 2

## 2020-04-12 MED ORDER — METOPROLOL TARTRATE 5 MG/5ML IV SOLN: 2.0000 mg | INTRAVENOUS | Status: DC | PRN

## 2020-04-12 MED ORDER — LIDOCAINE 2% (20 MG/ML) 5 ML SYRINGE
INTRAMUSCULAR | Status: AC
  Filled 2020-04-12: qty 5

## 2020-04-12 MED ORDER — LACTATED RINGERS IV SOLN: INTRAVENOUS | Status: DC

## 2020-04-12 MED ORDER — GLYCOPYRROLATE PF 0.2 MG/ML IJ SOSY
PREFILLED_SYRINGE | INTRAMUSCULAR | Status: DC | PRN
  Administered 2020-04-12: .2 mg via INTRAVENOUS

## 2020-04-12 MED ORDER — ONDANSETRON HCL 4 MG/2ML IJ SOLN
INTRAMUSCULAR | Status: DC | PRN
  Administered 2020-04-12: 4 mg via INTRAVENOUS

## 2020-04-12 MED ORDER — OXYCODONE-ACETAMINOPHEN 5-325 MG PO TABS
1.0000 | ORAL_TABLET | ORAL | Status: DC | PRN
  Administered 2020-04-12: 1 via ORAL
  Filled 2020-04-12: qty 1

## 2020-04-12 MED ORDER — HEPARIN SODIUM (PORCINE) 1000 UNIT/ML IJ SOLN
INTRAMUSCULAR | Status: DC | PRN
  Administered 2020-04-12: 10000 [IU] via INTRAVENOUS

## 2020-04-12 MED ORDER — PRAZOSIN HCL 1 MG PO CAPS
1.0000 mg | ORAL_CAPSULE | Freq: Every day | ORAL | Status: DC
  Administered 2020-04-12: 1 mg via ORAL
  Filled 2020-04-12: qty 1

## 2020-04-12 MED ORDER — PROTAMINE SULFATE 10 MG/ML IV SOLN
INTRAVENOUS | Status: AC
  Filled 2020-04-12: qty 5

## 2020-04-12 MED ORDER — POTASSIUM CHLORIDE CRYS ER 20 MEQ PO TBCR
20.0000 meq | EXTENDED_RELEASE_TABLET | Freq: Every day | ORAL | Status: DC | PRN
Start: 2020-04-12 — End: 2020-04-13

## 2020-04-12 MED ORDER — ROCURONIUM BROMIDE 10 MG/ML (PF) SYRINGE
PREFILLED_SYRINGE | INTRAVENOUS | Status: AC
  Filled 2020-04-12: qty 10

## 2020-04-12 MED ORDER — FLEET ENEMA 7-19 GM/118ML RE ENEM: 1.0000 | ENEMA | Freq: Once | RECTAL | Status: DC | PRN

## 2020-04-12 MED ORDER — HEPARIN SODIUM (PORCINE) 5000 UNIT/ML IJ SOLN
5000.0000 [IU] | Freq: Three times a day (TID) | INTRAMUSCULAR | Status: DC
  Administered 2020-04-12: 5000 [IU] via SUBCUTANEOUS
  Filled 2020-04-12 (×2): qty 1

## 2020-04-12 MED ORDER — PROTAMINE SULFATE 10 MG/ML IV SOLN
INTRAVENOUS | Status: DC | PRN
  Administered 2020-04-12: 50 mg via INTRAVENOUS

## 2020-04-12 MED ORDER — PHENOL 1.4 % MT LIQD: 1.0000 | OROMUCOSAL | Status: DC | PRN

## 2020-04-12 MED ORDER — MAGNESIUM SULFATE 2 GM/50ML IV SOLN: 2.0000 g | Freq: Every day | INTRAVENOUS | Status: DC | PRN

## 2020-04-12 MED ORDER — LIDOCAINE HCL (PF) 1 % IJ SOLN
INTRAMUSCULAR | Status: AC
  Filled 2020-04-12: qty 5

## 2020-04-12 MED ORDER — NYSTATIN 100000 UNIT/ML MT SUSP
5.0000 mL | Freq: Four times a day (QID) | OROMUCOSAL | Status: DC
  Administered 2020-04-12 – 2020-04-13 (×2): 500000 [IU] via ORAL
  Filled 2020-04-12 (×2): qty 5

## 2020-04-12 MED ORDER — DOCUSATE SODIUM 100 MG PO CAPS
100.0000 mg | ORAL_CAPSULE | Freq: Every day | ORAL | Status: DC
  Administered 2020-04-13: 100 mg via ORAL
  Filled 2020-04-12: qty 1

## 2020-04-12 MED ORDER — LISINOPRIL 10 MG PO TABS
10.0000 mg | ORAL_TABLET | Freq: Every day | ORAL | Status: DC
  Administered 2020-04-13: 10 mg via ORAL
  Filled 2020-04-12: qty 1

## 2020-04-12 MED ORDER — FERROUS SULFATE 325 (65 FE) MG PO TABS
325.0000 mg | ORAL_TABLET | ORAL | Status: DC
  Filled 2020-04-12: qty 1

## 2020-04-12 MED ORDER — ACETAMINOPHEN 500 MG PO TABS
1000.0000 mg | ORAL_TABLET | Freq: Once | ORAL | Status: AC
  Administered 2020-04-12: 1000 mg via ORAL
  Filled 2020-04-12: qty 2

## 2020-04-12 MED ORDER — ROCURONIUM BROMIDE 10 MG/ML (PF) SYRINGE
PREFILLED_SYRINGE | INTRAVENOUS | Status: DC | PRN
  Administered 2020-04-12: 60 mg via INTRAVENOUS
  Administered 2020-04-12: 5 mg via INTRAVENOUS

## 2020-04-12 MED ORDER — PHENYLEPHRINE 40 MCG/ML (10ML) SYRINGE FOR IV PUSH (FOR BLOOD PRESSURE SUPPORT)
PREFILLED_SYRINGE | INTRAVENOUS | Status: DC | PRN
  Administered 2020-04-12: 40 ug via INTRAVENOUS
  Administered 2020-04-12: 120 ug via INTRAVENOUS

## 2020-04-12 MED ORDER — LABETALOL HCL 5 MG/ML IV SOLN
INTRAVENOUS | Status: AC
  Filled 2020-04-12: qty 4

## 2020-04-12 MED ORDER — PHENYLEPHRINE HCL-NACL 10-0.9 MG/250ML-% IV SOLN
INTRAVENOUS | Status: DC | PRN
  Administered 2020-04-12: 25 ug/min via INTRAVENOUS

## 2020-04-12 MED ORDER — SODIUM CHLORIDE 0.9 % IV SOLN
INTRAVENOUS | Status: AC
  Filled 2020-04-12: qty 1.2

## 2020-04-12 MED ORDER — ATORVASTATIN CALCIUM 80 MG PO TABS
80.0000 mg | ORAL_TABLET | Freq: Every day | ORAL | Status: DC
  Administered 2020-04-12: 80 mg via ORAL
  Filled 2020-04-12: qty 1

## 2020-04-12 MED ORDER — SUGAMMADEX SODIUM 200 MG/2ML IV SOLN
INTRAVENOUS | Status: DC | PRN
  Administered 2020-04-12: 200 mg via INTRAVENOUS

## 2020-04-12 MED ORDER — HEPARIN SODIUM (PORCINE) 1000 UNIT/ML IJ SOLN
INTRAMUSCULAR | Status: AC
  Filled 2020-04-12: qty 1

## 2020-04-12 MED ORDER — CEFAZOLIN SODIUM-DEXTROSE 2-4 GM/100ML-% IV SOLN
2.0000 g | INTRAVENOUS | Status: AC
  Administered 2020-04-12: 2 g via INTRAVENOUS
  Filled 2020-04-12: qty 100

## 2020-04-12 MED ORDER — HYDRALAZINE HCL 20 MG/ML IJ SOLN: 5.0000 mg | INTRAMUSCULAR | Status: DC | PRN

## 2020-04-12 MED ORDER — ONDANSETRON HCL 4 MG/2ML IJ SOLN: 4.0000 mg | Freq: Once | INTRAMUSCULAR | Status: DC | PRN

## 2020-04-12 MED ORDER — DEXAMETHASONE SODIUM PHOSPHATE 10 MG/ML IJ SOLN
INTRAMUSCULAR | Status: DC | PRN
  Administered 2020-04-12: 5 mg via INTRAVENOUS

## 2020-04-12 MED ORDER — FENTANYL CITRATE (PF) 100 MCG/2ML IJ SOLN
INTRAMUSCULAR | Status: DC | PRN
  Administered 2020-04-12: 100 ug via INTRAVENOUS
  Administered 2020-04-12: 50 ug via INTRAVENOUS

## 2020-04-12 MED ORDER — ONDANSETRON HCL 4 MG/2ML IJ SOLN
INTRAMUSCULAR | Status: AC
  Filled 2020-04-12: qty 2

## 2020-04-12 MED ORDER — PROPOFOL 10 MG/ML IV BOLUS
INTRAVENOUS | Status: DC | PRN
  Administered 2020-04-12: 150 mg via INTRAVENOUS

## 2020-04-12 MED ORDER — CHLORHEXIDINE GLUCONATE CLOTH 2 % EX PADS: 6.0000 | MEDICATED_PAD | Freq: Once | CUTANEOUS | Status: DC

## 2020-04-12 MED ORDER — PAROXETINE HCL 20 MG PO TABS
20.0000 mg | ORAL_TABLET | ORAL | Status: DC
  Filled 2020-04-12: qty 1

## 2020-04-12 MED ORDER — BISACODYL 5 MG PO TBEC: 5.0000 mg | DELAYED_RELEASE_TABLET | Freq: Every day | ORAL | Status: DC | PRN

## 2020-04-12 MED ORDER — PANTOPRAZOLE SODIUM 40 MG PO TBEC
40.0000 mg | DELAYED_RELEASE_TABLET | Freq: Every day | ORAL | Status: DC
  Administered 2020-04-13: 40 mg via ORAL
  Filled 2020-04-12: qty 1

## 2020-04-12 MED ORDER — ESMOLOL HCL 100 MG/10ML IV SOLN
INTRAVENOUS | Status: AC
  Filled 2020-04-12: qty 10

## 2020-04-12 MED ORDER — IODIXANOL 320 MG/ML IV SOLN
INTRAVENOUS | Status: DC | PRN
  Administered 2020-04-12: 16 mL via INTRA_ARTERIAL

## 2020-04-12 MED ORDER — CHLORHEXIDINE GLUCONATE 0.12 % MT SOLN
15.0000 mL | Freq: Once | OROMUCOSAL | Status: AC
  Administered 2020-04-12: 15 mL via OROMUCOSAL
  Filled 2020-04-12: qty 15

## 2020-04-12 MED ORDER — SENNOSIDES-DOCUSATE SODIUM 8.6-50 MG PO TABS: 1.0000 | ORAL_TABLET | Freq: Every evening | ORAL | Status: DC | PRN

## 2020-04-12 MED ORDER — ACETAMINOPHEN 325 MG PO TABS: 325.0000 mg | ORAL_TABLET | ORAL | Status: DC | PRN

## 2020-04-12 MED ORDER — ALUM & MAG HYDROXIDE-SIMETH 200-200-20 MG/5ML PO SUSP
15.0000 mL | ORAL | Status: DC | PRN
Start: 2020-04-12 — End: 2020-04-13

## 2020-04-12 MED ORDER — ASPIRIN EC 81 MG PO TBEC
81.0000 mg | DELAYED_RELEASE_TABLET | Freq: Every day | ORAL | Status: DC
  Administered 2020-04-13: 81 mg via ORAL
  Filled 2020-04-12: qty 1

## 2020-04-12 MED ORDER — HYDROMORPHONE HCL 1 MG/ML IJ SOLN
0.5000 mg | INTRAMUSCULAR | Status: DC | PRN
Start: 2020-04-12 — End: 2020-04-13

## 2020-04-12 MED ORDER — 0.9 % SODIUM CHLORIDE (POUR BTL) OPTIME
TOPICAL | Status: DC | PRN
  Administered 2020-04-12: 1000 mL

## 2020-04-12 MED ORDER — DEXAMETHASONE SODIUM PHOSPHATE 10 MG/ML IJ SOLN
INTRAMUSCULAR | Status: AC
  Filled 2020-04-12: qty 1

## 2020-04-12 MED ORDER — PROPOFOL 10 MG/ML IV BOLUS
INTRAVENOUS | Status: AC
  Filled 2020-04-12: qty 20

## 2020-04-12 MED ORDER — ORAL CARE MOUTH RINSE: 15.0000 mL | Freq: Once | OROMUCOSAL | Status: AC

## 2020-04-12 MED ORDER — CLOPIDOGREL BISULFATE 75 MG PO TABS
75.0000 mg | ORAL_TABLET | Freq: Every day | ORAL | Status: DC
Start: 2020-04-12 — End: 2020-04-13
  Administered 2020-04-13: 75 mg via ORAL
  Filled 2020-04-12: qty 1

## 2020-04-12 MED ORDER — LIDOCAINE 2% (20 MG/ML) 5 ML SYRINGE
INTRAMUSCULAR | Status: DC | PRN
  Administered 2020-04-12: 80 mg via INTRAVENOUS

## 2020-04-12 MED ORDER — ESMOLOL HCL 100 MG/10ML IV SOLN
INTRAVENOUS | Status: DC | PRN
  Administered 2020-04-12 (×2): 20 mg via INTRAVENOUS

## 2020-04-12 SURGICAL SUPPLY — 62 items
BAG BANDED W/RUBBER/TAPE 36X54 (MISCELLANEOUS) ×4 IMPLANT
BALLN STERLING RX 4X30X80 (BALLOONS) ×4
BALLOON STERLING RX 4X30X80 (BALLOONS) ×2 IMPLANT
CANISTER SUCT 3000ML PPV (MISCELLANEOUS) ×4 IMPLANT
CATH BEACON 5 .035 40 KMP TP (CATHETERS) ×2 IMPLANT
CATH BEACON 5 .038 40 KMP TP (CATHETERS) ×2
CATH ROBINSON RED A/P 18FR (CATHETERS) IMPLANT
CLIP VESOCCLUDE MED 6/CT (CLIP) ×4 IMPLANT
CLIP VESOCCLUDE SM WIDE 6/CT (CLIP) ×4 IMPLANT
COVER DOME SNAP 22 D (MISCELLANEOUS) ×4 IMPLANT
COVER PROBE W GEL 5X96 (DRAPES) ×4 IMPLANT
COVER WAND RF STERILE (DRAPES) ×4 IMPLANT
DERMABOND ADVANCED (GAUZE/BANDAGES/DRESSINGS) ×4
DERMABOND ADVANCED .7 DNX12 (GAUZE/BANDAGES/DRESSINGS) ×4 IMPLANT
DRAPE FEMORAL ANGIO 80X135IN (DRAPES) ×4 IMPLANT
ELECT REM PT RETURN 9FT ADLT (ELECTROSURGICAL) ×4
ELECTRODE REM PT RTRN 9FT ADLT (ELECTROSURGICAL) ×2 IMPLANT
GLOVE BIO SURGEON STRL SZ7.5 (GLOVE) ×4 IMPLANT
GOWN STRL REUS W/ TWL LRG LVL3 (GOWN DISPOSABLE) ×4 IMPLANT
GOWN STRL REUS W/ TWL XL LVL3 (GOWN DISPOSABLE) ×2 IMPLANT
GOWN STRL REUS W/TWL LRG LVL3 (GOWN DISPOSABLE) ×4
GOWN STRL REUS W/TWL XL LVL3 (GOWN DISPOSABLE) ×2
GUIDEWIRE ENROUTE 0.014 (WIRE) ×4 IMPLANT
HEMOSTAT SNOW SURGICEL 2X4 (HEMOSTASIS) IMPLANT
INSERT FOGARTY SM (MISCELLANEOUS) IMPLANT
INTRODUCER KIT GALT 7CM (INTRODUCER) ×2
KIT BASIN OR (CUSTOM PROCEDURE TRAY) ×4 IMPLANT
KIT ENCORE 26 ADVANTAGE (KITS) ×4 IMPLANT
KIT INTRODUCER GALT 7 (INTRODUCER) ×2 IMPLANT
KIT TURNOVER KIT B (KITS) ×4 IMPLANT
LOOP VESSEL MAXI BLUE (MISCELLANEOUS) ×4 IMPLANT
NEEDLE HYPO 25GX1X1/2 BEV (NEEDLE) IMPLANT
PACK CAROTID (CUSTOM PROCEDURE TRAY) ×4 IMPLANT
POSITIONER HEAD DONUT 9IN (MISCELLANEOUS) ×4 IMPLANT
PROTECTION STATION PRESSURIZED (MISCELLANEOUS) ×4
SET MICROPUNCTURE 5F STIFF (MISCELLANEOUS) IMPLANT
SHEATH AVANTI 11CM 5FR (SHEATH) IMPLANT
SHUNT CAROTID BYPASS 10 (VASCULAR PRODUCTS) IMPLANT
SHUNT CAROTID BYPASS 12FRX15.5 (VASCULAR PRODUCTS) IMPLANT
STATION PROTECTION PRESSURIZED (MISCELLANEOUS) ×2 IMPLANT
STENT TRANSCAROTID SYSTEM 8X40 (Permanent Stent) ×4 IMPLANT
SUT MNCRL AB 4-0 PS2 18 (SUTURE) ×4 IMPLANT
SUT PROLENE 5 0 C 1 24 (SUTURE) ×4 IMPLANT
SUT PROLENE 6 0 BV (SUTURE) IMPLANT
SUT PROLENE 7 0 BV 1 (SUTURE) IMPLANT
SUT SILK 2 0 PERMA HAND 18 BK (SUTURE) ×4 IMPLANT
SUT SILK 2 0 SH CR/8 (SUTURE) IMPLANT
SUT SILK 3 0 (SUTURE)
SUT SILK 3-0 18XBRD TIE 12 (SUTURE) IMPLANT
SUT VIC AB 3-0 SH 27 (SUTURE) ×2
SUT VIC AB 3-0 SH 27X BRD (SUTURE) ×2 IMPLANT
SYR 10ML LL (SYRINGE) ×12 IMPLANT
SYR 20ML LL LF (SYRINGE) ×4 IMPLANT
SYR CONTROL 10ML LL (SYRINGE) IMPLANT
SYSTEM TRANSCAROTID NEUROPRTCT (MISCELLANEOUS) ×2 IMPLANT
TOWEL GREEN STERILE (TOWEL DISPOSABLE) ×4 IMPLANT
TRANSCAROTID NEUROPROTECT SYS (MISCELLANEOUS) ×4
TUBING ART PRESS 48 MALE/FEM (TUBING) IMPLANT
TUBING EXTENTION W/L.L. (IV SETS) IMPLANT
WATER STERILE IRR 1000ML POUR (IV SOLUTION) ×4 IMPLANT
WIRE STARTER BENTSON 035X150 (WIRE) ×4 IMPLANT
WIRE TORQFLEX AUST .018X40CM (WIRE) ×4 IMPLANT

## 2020-04-12 NOTE — Transfer of Care (Signed)
Immediate Anesthesia Transfer of Care Note  Patient: Alec Sanchez  Procedure(s) Performed: RIGHT TRANSCAROTID ARTERY REVASCULARIZATION (Right Neck) ULTRASOUND GUIDANCE FOR VASCULAR ACCESS, left femoral vein (Left Groin)  Patient Location: PACU  Anesthesia Type:General  Level of Consciousness: drowsy and patient cooperative  Airway & Oxygen Therapy: Patient Spontanous Breathing  Post-op Assessment: Report given to RN and Patient moving all extremities X 4  Post vital signs: Reviewed and stable  Last Vitals:  Vitals Value Taken Time  BP    Temp    Pulse    Resp    SpO2      Last Pain:  Vitals:   04/12/20 0703  TempSrc: Oral  PainSc:          Complications: No complications documented.

## 2020-04-12 NOTE — Plan of Care (Signed)

## 2020-04-12 NOTE — Discharge Instructions (Signed)
   [facility]  Discharge Instructions   Carotid Endarterectomy (CEA)  Please refer to the following instructions for your post-procedure care. Your surgeon or physician assistant will discuss any changes with you.  Activity  You are encouraged to walk as much as you can. You can slowly return to normal activities but must avoid strenuous activity and heavy lifting until your doctor tell you it's OK. Avoid activities such as vacuuming or swinging a golf club. You can drive after one week if you are comfortable and you are no longer taking prescription pain medications. It is normal to feel tired for serval weeks after your surgery. It is also normal to have difficulty with sleep habits, eating, and bowel movements after surgery. These will go away with time.  Bathing/Showering  You may shower after you come home. Do not soak in a bathtub, hot tub, or swim until the incision heals completely.  Incision Care  Shower every day. Clean your incision with mild soap and water. Pat the area dry with a clean towel. You do not need a bandage unless otherwise instructed. Do not apply any ointments or creams to your incision. You may have skin glue on your incision. Do not peel it off. It will come off on its own in about one week. Your incision may feel thickened and raised for several weeks after your surgery. This is normal and the skin will soften over time. For Men Only: It's OK to shave around the incision but do not shave the incision itself for 2 weeks. It is common to have numbness under your chin that could last for several months.  Diet  Resume your normal diet. There are no special food restrictions following this procedure. A low fat/low cholesterol diet is recommended for all patients with vascular disease. In order to heal from your surgery, it is CRITICAL to get adequate nutrition. Your body requires vitamins, minerals, and protein. Vegetables are the best  source of vitamins and minerals. Vegetables also provide the perfect balance of protein. Processed food has little nutritional value, so try to avoid this.        Medications  Resume taking all of your medications unless your doctor or physician assistant tells you not to. If your incision is causing pain, you may take over-the- counter pain relievers such as acetaminophen (Tylenol). If you were prescribed a stronger pain medication, please be aware these medications can cause nausea and constipation. Prevent nausea by taking the medication with a snack or meal. Avoid constipation by drinking plenty of fluids and eating foods with a high amount of fiber, such as fruits, vegetables, and grains. Do not take Tylenol if you are taking prescription pain medications.  Follow Up  Our office will schedule a follow up appointment 2-3 weeks following discharge.  Please call us immediately for any of the following conditions  Increased pain, redness, drainage (pus) from your incision site. Fever of 101 degrees or higher. If you should develop stroke (slurred speech, difficulty swallowing, weakness on one side of your body, loss of vision) you should call 911 and go to the nearest emergency room.  Reduce your risk of vascular disease:  Stop smoking. If you would like help call [facility] at 1-800-QUIT-NOW (1-800-784-8669) or [place] at 336-586-4000. Manage your cholesterol Maintain a desired weight Control your diabetes Keep your blood pressure down  If you have any questions, please call the office at [phone].   

## 2020-04-12 NOTE — Progress Notes (Signed)
Arrived from PACU.  A&Ox4.  Vital signs taken.  CCMD notified.  Oriented to room.  Neuro checks/vital sign protocol initiated

## 2020-04-12 NOTE — Telephone Encounter (Signed)
Marylene Land with [facility] has already established an authorization through the [place] and will be servicing this pt's nursing home health needs. She can be reached @ [phone]

## 2020-04-12 NOTE — Anesthesia Procedure Notes (Signed)
Procedure Name: Intubation Date/Time: 04/12/2020 8:40 AM Performed by: Barrington Ellison, CRNA Pre-anesthesia Checklist: Patient identified, Emergency Drugs available, Suction available and Patient being monitored Patient Re-evaluated:Patient Re-evaluated prior to induction Oxygen Delivery Method: Circle System Utilized Preoxygenation: Pre-oxygenation with 100% oxygen Induction Type: IV induction Ventilation: Mask ventilation without difficulty Laryngoscope Size: Mac and 4 Grade View: Grade I Tube type: Oral Tube size: 7.5 mm Number of attempts: 1 Airway Equipment and Method: Stylet and Oral airway Placement Confirmation: ETT inserted through vocal cords under direct vision,  positive ETCO2 and breath sounds checked- equal and bilateral Secured at: 22 cm Tube secured with: Tape Dental Injury: Teeth and Oropharynx as per pre-operative assessment

## 2020-04-12 NOTE — Interval H&P Note (Signed)
History and Physical Interval Note:  04/12/2020 7:50 AM  Alec Sanchez  has presented today for surgery, with the diagnosis of Carotid Artery Stenosis.  The various methods of treatment have been discussed with the patient and family. After consideration of risks, benefits and other options for treatment, the patient has consented to  Procedure(s): RIGHT TRANSCAROTID ARTERY REVASCULARIZATION (Right) as a surgical intervention.  The patient's history has been reviewed, patient examined, no change in status, stable for surgery.  I have reviewed the patient's chart and labs.  Questions were answered to the patient's satisfaction.     Lemar Livings

## 2020-04-12 NOTE — Progress Notes (Addendum)
   A & O x 3 Right subclavian/neck incision healing well Moving all ext.  No neurologic deficits  Stable post op  Mosetta Pigeon PA-C

## 2020-04-12 NOTE — Anesthesia Procedure Notes (Signed)
Arterial Line Insertion Start/End2/16/2022 7:55 AM, 04/12/2020 7:58 AM Performed by: De Nurse, CRNA, CRNA  Patient location: Pre-op. Preanesthetic checklist: patient identified, risks and benefits discussed and pre-op evaluation Left, radial was placed Catheter size: 20 G Hand hygiene performed  and maximum sterile barriers used  Allen's test indicative of satisfactory collateral circulation Attempts: 1 Procedure performed without using ultrasound guided technique. Following insertion, dressing applied and Biopatch. Post procedure assessment: normal  Patient tolerated the procedure well with no immediate complications.

## 2020-04-12 NOTE — Op Note (Signed)
Patient name: Alec Sanchez MRN: [id number] DOB: 1955-04-09 Sex: male  04/12/2020 Pre-operative Diagnosis: Asymptomatic high-grade right internal carotid artery stenosis Post-operative diagnosis:  Same Surgeon:  Apolinar Junes C. Randie Heinz, MD Assistant: Clinton Gallant, PA Procedure Performed: 1.  Transcarotid artery stenting of right ICA with 8 x 40 mm Enroute stent and flow reversal neuro protection 2.  Ultrasound-guided cannulation of left common femoral vein with placement of flow reversal sheath  Indications: 65 year old male with history of a left-sided basal ganglia stroke.  During work-up for his stroke he was noted to have string sign on the right ICA.  He was indicated for transcarotid artery stenting.  An assistant was necessary for exposure of the common carotid artery, manipulation of wires and sheaths and to expedite the case.  Findings: Common carotid artery itself was large and soft without any disease notable and there was a long runway prior to the bifurcation of the carotid arteries.  After balloon angioplasty and stenting where previously there was a very high-grade string sign stenosis there was no residual stenosis and the stent was visualized in 2 planes and noted to be patent.   Procedure:  The patient was identified in the holding area and taken to the operating room where he was placed supine operative when general anesthesia was induced.  He was sterilely prepped and draped in the right neck and bilateral groins in the usual fashion antibiotics were administered timeout was called.  Ultrasound was used to identify the common carotid artery this was marked on the skin.  We then used the same ultrasound to identify the left common femoral vein this was cannulated with micropuncture needle followed by wire and sheath.  Bentson wires placed followed by 8 French sheath and this was flushed and affixed to the skin with silk suture.  A longitudinal incision was then made between the 2 heads  of sternocleidomastoid down onto the clavicle.  We dissected down through the 2 heads of sternocleidomastoid identified the internal jugular vein retracted this laterally identify the common carotid artery.  This was dissected out we placed both a umbilical tape and a vessel loop around and the patient was fully heparinized and ACT returned greater than 300.  A figure-of-eight 5-0 Prolene suture was placed in the expected cannulation site.  The artery was cannulated micropuncture needle was advanced 3 cm and the sheath was placed to 3 cm.  Angiography was performed.  An Amplatz wire was placed to the carotid bifurcation and the 8 French flow reversal sheath was placed under fluoroscopic guidance and it was affixed to the skin in 2 locations.  Flow reversal was connected and confirmed.  Further imaging was obtained.  We used an RAO projection to identify the bifurcation.  Timeout was then called.  The common carotid artery was clamped with vessel loop and flow reversal was confirmed.  We crossed the lesion using a Berenstein catheter and 014 wire.  Lesion was then predilated with 4 mm x 30 mm balloon.  We then primarily stented with an 8 x 40 stent.  We allowed 3 minutes of washout time we then performed angiography and 2 views which demonstrated patency of the stent.  The wire was removed the clamp was undone.  Sheath was removed and figure-of-eight suture was cinched.  Doppler confirmed flow.  50 mg of protamine was administered.  We removed the sheath and left groin pressure was held till hemostasis obtained.  We irrigated the neck wound obtaining the stasis and closed in  layers the platysma with Vicryl and the skin with Monocryl.  Dermabond was placed at the skin level.  Patient was awakened from anesthesia he was neurologically intact moving all extremities.  He was transferred to the recovery area in stable condition.  All counts were correct at completion.  EBL: 25cc  Contrast: 16cc  Alec Sanchez C. Randie Heinz,  MD [facility] Office: [phone] Pager: [phone]

## 2020-04-13 ENCOUNTER — Encounter (HOSPITAL_COMMUNITY): Payer: Self-pay | Admitting: Vascular Surgery

## 2020-04-13 LAB — CBC
HCT: 35.6 % — ABNORMAL LOW (ref 39.0–52.0)
Hemoglobin: 12.3 g/dL — ABNORMAL LOW (ref 13.0–17.0)
MCH: 31.1 pg (ref 26.0–34.0)
MCHC: 34.6 g/dL (ref 30.0–36.0)
MCV: 90.1 fL (ref 80.0–100.0)
Platelets: 330 10*3/uL (ref 150–400)
RBC: 3.95 MIL/uL — ABNORMAL LOW (ref 4.22–5.81)
RDW: 14 % (ref 11.5–15.5)
WBC: 18.2 10*3/uL — ABNORMAL HIGH (ref 4.0–10.5)
nRBC: 0 % (ref 0.0–0.2)

## 2020-04-13 LAB — BASIC METABOLIC PANEL
Anion gap: 10 (ref 5–15)
BUN: 13 mg/dL (ref 8–23)
CO2: 23 mmol/L (ref 22–32)
Calcium: 9.5 mg/dL (ref 8.9–10.3)
Chloride: 106 mmol/L (ref 98–111)
Creatinine, Ser: 1.18 mg/dL (ref 0.61–1.24)
GFR, Estimated: >60 mL/min (ref >60)
Glucose, Bld: 125 mg/dL — ABNORMAL HIGH (ref 70–99)
Potassium: 4 mmol/L (ref 3.5–5.1)
Sodium: 139 mmol/L (ref 135–145)

## 2020-04-13 LAB — POCT ACTIVATED CLOTTING TIME: Activated Clotting Time: 315 seconds

## 2020-04-13 MED ORDER — OXYCODONE-ACETAMINOPHEN 5-325 MG PO TABS: 1.0000 | ORAL_TABLET | ORAL | 0 refills | Status: DC | PRN

## 2020-04-13 NOTE — Anesthesia Postprocedure Evaluation (Signed)
Anesthesia Post Note  Patient: Alec Sanchez  Procedure(s) Performed: RIGHT TRANSCAROTID ARTERY REVASCULARIZATION (Right Neck) ULTRASOUND GUIDANCE FOR VASCULAR ACCESS, left femoral vein (Left Groin)     Patient location during evaluation: PACU Anesthesia Type: General Level of consciousness: awake and alert Pain management: pain level controlled Vital Signs Assessment: post-procedure vital signs reviewed and stable Respiratory status: spontaneous breathing, nonlabored ventilation, respiratory function stable and patient connected to nasal cannula oxygen Cardiovascular status: blood pressure returned to baseline and stable Postop Assessment: no apparent nausea or vomiting Anesthetic complications: no   No complications documented.  Last Vitals:  Vitals:   04/13/20 0600 04/13/20 0812  BP: (!) 146/76 (!) 149/71  Pulse: 61 65  Resp: 13 14  Temp: 36.7 C 36.9 C  SpO2: 98% 99%    Last Pain:  Vitals:   04/13/20 0812  TempSrc: Oral  PainSc: 0-No pain                 Cecile Hearing

## 2020-04-13 NOTE — Progress Notes (Addendum)
[facility]  Subjective  - Doing well    Objective (!) 146/76 61 98.1 F (36.7 C) (Oral) 13 98%  Intake/Output Summary (Last 24 hours) at 04/13/2020 7371 Last data filed at 04/13/2020 0500 Gross per 24 hour  Intake 700 ml  Output 1100 ml  Net -400 ml   Right subclavian/neck incision healing well Left groin soft without bloody drainage Moving all ext.   Palpable right radial artery No neurologic deficits    Assessment/Planning: POD # 1 TCAR  Patient is tolerating PO's, has voided, and not having much discomfort.  Pending ambulation he will likely b discharged later today. F/U in [facility] office with PA in 3-4 weeks with carotid duplex  Mosetta Pigeon 04/13/2020 7:21 AM --  Laboratory Lab Results: Recent Labs    04/12/20 1323 04/13/20 0104  WBC 11.7* 18.2*  HGB 13.5 12.3*  HCT 39.8 35.6*  PLT 333 330   BMET Recent Labs    04/12/20 1323 04/13/20 0104  NA  --  139  K  --  4.0  CL  --  106  CO2  --  23  GLUCOSE  --  125*  BUN  --  13  CREATININE 1.13 1.18  CALCIUM  --  9.5    COAG Lab Results  Component Value Date   INR 1.1 04/06/2020   INR 1.0 03/10/2020   INR 1.1 01/31/2020   No results found for: PTT  I have independently interviewed and examined patient and agree with PA assessment and plan above.   Brandon C. Randie Heinz, MD [facility] Office: [phone] Pager: [phone]

## 2020-04-13 NOTE — Progress Notes (Signed)
Pt provided discharge instructions and education. IV removed and intact. CCMD notified, telebox removed. Pt denies complaints. All vitals stable. Pt has all belongings. Pt to be tx via valet to meet ride.  Lacy Duverney, RN

## 2020-04-14 DIAGNOSIS — I6529 Occlusion and stenosis of unspecified carotid artery: Secondary | ICD-10-CM | POA: Diagnosis not present

## 2020-04-14 DIAGNOSIS — D72829 Elevated white blood cell count, unspecified: Secondary | ICD-10-CM | POA: Diagnosis not present

## 2020-04-14 DIAGNOSIS — R7303 Prediabetes: Secondary | ICD-10-CM | POA: Diagnosis not present

## 2020-04-14 DIAGNOSIS — Z008 Encounter for other general examination: Secondary | ICD-10-CM | POA: Diagnosis not present

## 2020-04-14 DIAGNOSIS — I10 Essential (primary) hypertension: Secondary | ICD-10-CM | POA: Diagnosis not present

## 2020-04-18 NOTE — Discharge Summary (Signed)
[facility] Discharge Summary   Patient ID:  Alec Sanchez MRN: [id number] DOB/AGE: 10/09/1955 64 y.o.  Admit date: 04/12/2020 Discharge date: 04/13/20 Date of Surgery: 04/12/2020 Surgeon: Surgeon(s): Maeola Harman, MD  Admission Diagnosis: Carotid stenosis [I65.29]  Discharge Diagnoses:  Carotid stenosis [I65.29]  Secondary Diagnoses: Past Medical History:  Diagnosis Date  . Anxiety   . Carotid artery occlusion   . Depression   . Hyperlipidemia   . Hypertension   . Stroke (HCC)     Procedure(s): RIGHT TRANSCAROTID ARTERY REVASCULARIZATION ULTRASOUND GUIDANCE FOR VASCULAR ACCESS, left femoral vein  Discharged Condition: stable  HPI: Alec Sanchez is a 65 y.o. male history of hypertension, hyperlipidemia diabetes and tobacco abuse.  Recently diagnosed with left-sided stroke and had a CTA that demonstrated a string sign on the right ICA as well as stenosis of the left. Initially stroke gave him right-sided hand weakness and slurred speech which did improve during his hospitalization.    Hospital Course:  Alec Sanchez is a 65 y.o. male is S/P Procedure(s): RIGHT TRANSCAROTID ARTERY REVASCULARIZATION ULTRASOUND GUIDANCE FOR VASCULAR ACCESS, left femoral vein Post op he was stable without neurologic deficits.  Ambulating, tolerating PO's.  Incision healing well without hematoma.Discharged in stable condition.   Significant Diagnostic Studies: CBC Lab Results  Component Value Date   WBC 18.2 (H) 04/13/2020   HGB 12.3 (L) 04/13/2020   HCT 35.6 (L) 04/13/2020   MCV 90.1 04/13/2020   PLT 330 04/13/2020    BMET    Component Value Date/Time   NA 139 04/13/2020 0104   K 4.0 04/13/2020 0104   CL 106 04/13/2020 0104   CO2 23 04/13/2020 0104   GLUCOSE 125 (H) 04/13/2020 0104   BUN 13 04/13/2020 0104   CREATININE 1.18 04/13/2020 0104   CALCIUM 9.5 04/13/2020 0104   GFRNONAA >60 04/13/2020 0104   COAG Lab Results  Component Value Date    INR 1.1 04/06/2020   INR 1.0 03/10/2020   INR 1.1 01/31/2020     Disposition:  Discharge to :Home Discharge Instructions    Call MD for:  redness, tenderness, or signs of infection (pain, swelling, bleeding, redness, odor or green/yellow discharge around incision site)   Complete by: As directed    Call MD for:  severe or increased pain, loss or decreased feeling  in affected limb(s)   Complete by: As directed    Call MD for:  temperature >100.5   Complete by: As directed    Resume previous diet   Complete by: As directed      Allergies as of 04/13/2020      Reactions   Trazodone Nausea And Vomiting      Medication List    TAKE these medications   amLODipine 10 MG tablet Commonly known as: NORVASC Take 10 mg by mouth daily. Notes to patient: Take tomorrow   aspirin 81 MG EC tablet Take 1 tablet (81 mg total) by mouth daily. Swallow whole. Notes to patient: Take tomorrow   atorvastatin 80 MG tablet Commonly known as: LIPITOR Take 80 mg by mouth at bedtime. Notes to patient: Take tonight   cephALEXin 500 MG capsule Commonly known as: KEFLEX Take 1,000 mg by mouth 2 (two) times daily. Notes to patient: Take this evening   Cholecalciferol 25 MCG (1000 UT) tablet Take 1,000 Units by mouth daily.   clopidogrel 75 MG tablet Commonly known as: PLAVIX Take 75 mg by mouth daily. Notes to patient: Take tomorrow   ferrous sulfate  325 (65 FE) MG tablet Take 325 mg by mouth every Monday, Wednesday, and Friday. Notes to patient: Take tomorrow   lisinopril 10 MG tablet Commonly known as: ZESTRIL Take 10 mg by mouth daily. Notes to patient: Take tomorrow   nystatin 100000 UNIT/ML suspension Commonly known as: MYCOSTATIN Take 5 mLs by mouth 4 (four) times daily. Notes to patient: Take @ lunch today   oxyCODONE-acetaminophen 5-325 MG tablet Commonly known as: PERCOCET/ROXICET Take 1-2 tablets by mouth every 4 (four) hours as needed for moderate pain.   PARoxetine  40 MG tablet Commonly known as: PAXIL Take 20 mg by mouth every morning.   prazosin 1 MG capsule Commonly known as: MINIPRESS Take 1 mg by mouth at bedtime. Notes to patient: Take tonight      Verbal and written Discharge instructions given to the patient. Wound care per Discharge AVS  Follow-up Information    Maeola Harman, MD In 4 weeks.   Specialties: Vascular Surgery, Cardiology Why: Office will call you to arrange your appt (sent) Contact information: [address] [place] [address] [phone]               Signed: Mosetta Pigeon 04/18/2020, 8:50 AM --- For VQI Registry use --- Instructions: Press F2 to tab through selections.  Delete question if not applicable.   Modified Rankin score at D/C (0-6): Rankin Score=0  IV medication needed for:  1. Hypertension: No 2. Hypotension: No  Post-op Complications: No  1. Post-op CVA or TIA: No  If yes: Event classification (right eye, left eye, right cortical, left cortical, verterobasilar, other):   If yes: Timing of event (intra-op, <6 hrs post-op, >=6 hrs post-op, unknown):   2. CN injury: No  If yes: CN  injuried   3. Myocardial infarction: No  If yes: Dx by (EKG or clinical, Troponin):   4.  CHF: No  5.  Dysrhythmia (new): No  6. Wound infection: No  7. Reperfusion symptoms: No  8. Return to OR: No  If yes: return to OR for (bleeding, neurologic, other CEA incision, other):   Discharge medications: Statin use:  Yes ASA use:  Yes Beta blocker use:  No  for medical reason   ACE-Inhibitor use:  Yes P2Y12 Antagonist use: [ ]  None, [x ] Plavix, [ ]  Plasugrel, [ ]  Ticlopinine, [ ]  Ticagrelor, [ ]  Other, [ ]  No for medical reason, [ ]  Non-compliant, [ ]  Not-indicated Anti-coagulant use:  [x ] None, [ ]  Warfarin, [ ]  Rivaroxaban, [ ]  Dabigatran, [ ]  Other, [ ]  No for medical reason, [ ]  Non-compliant, [ ]  Not-indicated

## 2020-04-26 ENCOUNTER — Other Ambulatory Visit: Payer: Self-pay

## 2020-04-26 DIAGNOSIS — E559 Vitamin D deficiency, unspecified: Secondary | ICD-10-CM | POA: Insufficient documentation

## 2020-04-26 DIAGNOSIS — D75839 Thrombocytosis, unspecified: Secondary | ICD-10-CM | POA: Insufficient documentation

## 2020-04-26 DIAGNOSIS — M25511 Pain in right shoulder: Secondary | ICD-10-CM | POA: Insufficient documentation

## 2020-04-26 DIAGNOSIS — I10 Essential (primary) hypertension: Secondary | ICD-10-CM | POA: Insufficient documentation

## 2020-04-26 DIAGNOSIS — R69 Illness, unspecified: Secondary | ICD-10-CM | POA: Insufficient documentation

## 2020-04-26 DIAGNOSIS — S8263XA Displaced fracture of lateral malleolus of unspecified fibula, initial encounter for closed fracture: Secondary | ICD-10-CM | POA: Insufficient documentation

## 2020-04-26 DIAGNOSIS — R918 Other nonspecific abnormal finding of lung field: Secondary | ICD-10-CM | POA: Insufficient documentation

## 2020-04-26 DIAGNOSIS — F1210 Cannabis abuse, uncomplicated: Secondary | ICD-10-CM | POA: Insufficient documentation

## 2020-04-26 DIAGNOSIS — F32A Depression, unspecified: Secondary | ICD-10-CM | POA: Insufficient documentation

## 2020-04-26 DIAGNOSIS — N529 Male erectile dysfunction, unspecified: Secondary | ICD-10-CM | POA: Insufficient documentation

## 2020-04-26 DIAGNOSIS — M545 Low back pain, unspecified: Secondary | ICD-10-CM | POA: Insufficient documentation

## 2020-04-26 DIAGNOSIS — M25562 Pain in left knee: Secondary | ICD-10-CM | POA: Insufficient documentation

## 2020-04-26 DIAGNOSIS — F17200 Nicotine dependence, unspecified, uncomplicated: Secondary | ICD-10-CM | POA: Insufficient documentation

## 2020-04-26 DIAGNOSIS — F1010 Alcohol abuse, uncomplicated: Secondary | ICD-10-CM | POA: Insufficient documentation

## 2020-04-26 DIAGNOSIS — R7303 Prediabetes: Secondary | ICD-10-CM | POA: Insufficient documentation

## 2020-04-26 DIAGNOSIS — M25579 Pain in unspecified ankle and joints of unspecified foot: Secondary | ICD-10-CM | POA: Insufficient documentation

## 2020-04-26 DIAGNOSIS — F1410 Cocaine abuse, uncomplicated: Secondary | ICD-10-CM | POA: Insufficient documentation

## 2020-04-26 DIAGNOSIS — I6523 Occlusion and stenosis of bilateral carotid arteries: Secondary | ICD-10-CM

## 2020-04-26 DIAGNOSIS — E611 Iron deficiency: Secondary | ICD-10-CM | POA: Insufficient documentation

## 2020-04-26 DIAGNOSIS — D72829 Elevated white blood cell count, unspecified: Secondary | ICD-10-CM | POA: Insufficient documentation

## 2020-04-26 DIAGNOSIS — E785 Hyperlipidemia, unspecified: Secondary | ICD-10-CM | POA: Insufficient documentation

## 2020-04-26 DIAGNOSIS — R911 Solitary pulmonary nodule: Secondary | ICD-10-CM | POA: Insufficient documentation

## 2020-04-26 DIAGNOSIS — N189 Chronic kidney disease, unspecified: Secondary | ICD-10-CM | POA: Insufficient documentation

## 2020-04-26 DIAGNOSIS — K047 Periapical abscess without sinus: Secondary | ICD-10-CM | POA: Insufficient documentation

## 2020-05-05 ENCOUNTER — Ambulatory Visit (INDEPENDENT_AMBULATORY_CARE_PROVIDER_SITE_OTHER): Payer: Medicare Other | Admitting: Physician Assistant

## 2020-05-05 ENCOUNTER — Ambulatory Visit (HOSPITAL_COMMUNITY)
Admission: RE | Admit: 2020-05-05 | Discharge: 2020-05-05 | Disposition: A | Discharge status: Home / Self Care | Payer: Medicare Other | Source: Ambulatory Visit | Attending: Vascular Surgery | Admitting: Vascular Surgery

## 2020-05-05 ENCOUNTER — Encounter: Payer: No Typology Code available for payment source | Admitting: Vascular Surgery

## 2020-05-05 ENCOUNTER — Other Ambulatory Visit: Payer: Self-pay

## 2020-05-05 VITALS — BP 152/84 | HR 60 | Temp 97.9°F | Resp 20 | Ht 67.0 in | Wt 195.4 lb

## 2020-05-05 DIAGNOSIS — I6523 Occlusion and stenosis of bilateral carotid arteries: Secondary | ICD-10-CM

## 2020-05-05 NOTE — Progress Notes (Signed)
POST OPERATIVE OFFICE NOTE    CC:  F/u for surgery  HPI:  This is a 65 y.o. male who is s/p Right TCAR on 04/12/2020 by Dr. Randie Heinz for asymptomatic high grade ICA stenosis.  The left side is 40-59%.   He was originally seen in consultation for  evaluation of carotid artery stenosis.  He was diagnosed with a left-sided CVA.  Work-up included CTA neck demonstrating string sign of right ICA as well as about 60% stenosis of left ICA.  Patient states he had a prior CVA in the first part of 2020.  Presenting symptoms for current CVA or right arm and hand weakness as well as slurred speech.  Plan is for discharge to rehab.  Patient states hand weakness and slurred speech is still present however has improved.  Based on chart review he is noncompliant with home medications.  During current hospitalization he has been started on aspirin, Plavix, and statin.  Pt returns today for follow up.  Pt states he is doing well and denies any amaurosis fugax, speech difficulties, sudden numbness, weakness or paralysis of an arm or leg or facial droop.  Family member with him states his speech is much better as well.  He states he is taking his plavix, statin and asa.    Allergies  Allergen Reactions  . Trazodone Nausea And Vomiting    Current Outpatient Medications  Medication Sig Dispense Refill  . amLODipine (NORVASC) 10 MG tablet Take 10 mg by mouth daily.    Marland Kitchen aspirin EC 81 MG EC tablet Take 1 tablet (81 mg total) by mouth daily. Swallow whole. 30 tablet 11  . atorvastatin (LIPITOR) 80 MG tablet Take 80 mg by mouth at bedtime.    . cephALEXin (KEFLEX) 500 MG capsule Take 1,000 mg by mouth 2 (two) times daily.    . Cholecalciferol 25 MCG (1000 UT) tablet Take 1,000 Units by mouth daily.    . clopidogrel (PLAVIX) 75 MG tablet Take 75 mg by mouth daily.    . ferrous sulfate 325 (65 FE) MG tablet Take 325 mg by mouth every Monday, Wednesday, and Friday.    Marland Kitchen lisinopril (ZESTRIL) 10 MG tablet Take 10 mg by mouth  daily.    Marland Kitchen nystatin (MYCOSTATIN) 100000 UNIT/ML suspension Take 5 mLs by mouth 4 (four) times daily.    Marland Kitchen oxyCODONE-acetaminophen (PERCOCET/ROXICET) 5-325 MG tablet Take 1-2 tablets by mouth every 4 (four) hours as needed for moderate pain. 12 tablet 0  . PARoxetine (PAXIL) 40 MG tablet Take 20 mg by mouth every morning.    . prazosin (MINIPRESS) 1 MG capsule Take 1 mg by mouth at bedtime.     No current facility-administered medications for this visit.     ROS:  See HPI  Physical Exam:  Today's Vitals   05/05/20 0852 05/05/20 0853  BP: (!) 155/91 (!) 152/84  Pulse: 60   Resp: 20   Temp: 97.9 F (36.6 C)   TempSrc: Temporal   SpO2: 99%   Weight: 195 lb 6.4 oz (88.6 kg)   Height: 5\' 7"  (1.702 m)    Body mass index is 30.6 kg/m.   Incision:  Right neck incision is well healed.  Extremities:  Moving all extremities equally. Neuro: in tact  Carotid duplex 05/05/2020: Right:  1-39% ICA stenosis Left 40-59% ICA stenosis   Assessment/Plan:  This is a 65 y.o. male who is s/p: Right TCAR on 04/12/2020 by Dr. 04/14/2020   -pt doing well since surgery and  neuro in tact.  He does have a 40-59% left ICA stenosis.  He will return in 9 months for repeat carotid duplex.  Discussed stroke sx with pt and he knows to call 911 if he develops any of these sx.   -he will continue his plavix/statin/asa    Doreatha Massed, [facility] [phone]   Clinic MD:  Randie Heinz

## 2020-05-08 ENCOUNTER — Other Ambulatory Visit: Payer: Self-pay

## 2020-05-08 DIAGNOSIS — I6523 Occlusion and stenosis of bilateral carotid arteries: Secondary | ICD-10-CM

## 2020-11-30 ENCOUNTER — Encounter (HOSPITAL_COMMUNITY): Payer: Self-pay | Admitting: Emergency Medicine

## 2020-11-30 ENCOUNTER — Ambulatory Visit (HOSPITAL_COMMUNITY)
Admission: EM | Admit: 2020-11-30 | Discharge: 2020-11-30 | Disposition: A | Discharge status: Home / Self Care | Payer: Medicare Other | Attending: Emergency Medicine | Admitting: Emergency Medicine

## 2020-11-30 ENCOUNTER — Other Ambulatory Visit: Payer: Self-pay

## 2020-11-30 DIAGNOSIS — N23 Unspecified renal colic: Secondary | ICD-10-CM | POA: Diagnosis not present

## 2020-11-30 DIAGNOSIS — M545 Low back pain, unspecified: Secondary | ICD-10-CM

## 2020-11-30 LAB — POCT URINALYSIS DIPSTICK, ED / UC
Glucose, UA: NEGATIVE mg/dL
Ketones, ur: 40 mg/dL — AB
Leukocytes,Ua: NEGATIVE
Nitrite: NEGATIVE
Protein, ur: >=300 mg/dL — AB
Specific Gravity, Urine: >=1.03 (ref 1.005–1.030)
Urobilinogen, UA: 0.2 mg/dL (ref 0.0–1.0)
pH: 5.5 (ref 5.0–8.0)

## 2020-11-30 MED ORDER — PRAZOSIN HCL 1 MG PO CAPS: 1.0000 mg | ORAL_CAPSULE | Freq: Every day | ORAL | 0 refills | Status: DC

## 2020-11-30 NOTE — Discharge Instructions (Addendum)
Take the minipress every night.   Make sure you are drinking plenty of fluids, especially water.  You can take Tylenol and/or Ibuprofen as needed for pain relief and fever reduction.    Follow up with your primary care provider for re-evaluation as soon as possible.   If your pain gets worse, you are unable to urinate, or urinate bright red blood please go to the ED for further evaluation.

## 2020-11-30 NOTE — ED Triage Notes (Signed)
Pt reports lower back pain since Tuesday. Reports had a fall yesterday Denies any urinary or bowel problems.

## 2020-11-30 NOTE — ED Provider Notes (Signed)
[facility]    CSN: [id number] Arrival date & time: 11/30/20  1155      History   Chief Complaint Chief Complaint  Patient presents with   Back Pain    HPI Alec Sanchez is a 65 y.o. male.   Patient here for evaluation of lower back that has been ongoing since Tuesday.  Reports concerned that this is his "kidneys".  Denies any dysuria, urgency, or frequency.  Patient does report having a fall yesterday due to losing balance.  Typically does walk with a cane.  Denies any head trauma or loss of consciousness.  Denies any loss of bowel or bladder control.  Denies any pain, numbness, or tingling in lower extremities.  Denies any fevers, chest pain, shortness of breath, N/V/D, numbness, tingling, weakness, abdominal pain, or headaches.    The history is provided by the patient.  Back Pain  Past Medical History:  Diagnosis Date   Anxiety    Carotid artery occlusion    Depression    Hyperlipidemia    Hypertension    Stroke [facility])     Patient Active Problem List   Diagnosis Date Noted   Vitamin D deficiency 04/26/2020   Thrombocytosis 04/26/2020   Prediabetes 04/26/2020   Pain in left knee 04/26/2020   Other ill-defined and unknown causes of morbidity and mortality 04/26/2020   Cocaine abuse (HCC) 04/26/2020   Alcohol abuse 04/26/2020   Nonspecific abnormal findings on radiological and examination of lung field 04/26/2020   Multiple pulmonary nodules 04/26/2020   Leukocytosis 04/26/2020   Iron deficiency 04/26/2020   Hyperlipidemia 04/26/2020   Depression 04/26/2020   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   Closed fracture of lateral malleolus 04/26/2020   Cannabis abuse 04/26/2020   Ankle pain 04/26/2020   Tobacco use disorder 04/26/2020   Pain in joint of right shoulder 04/26/2020   Low back pain 04/26/2020   Hypertension 04/26/2020   Erectile dysfunction 04/26/2020   Dental abscess 04/26/2020   CKD (chronic kidney disease) 04/26/2020   Carotid stenosis  04/12/2020   Acute ischemic stroke (HCC) 01/31/2020    Past Surgical History:  Procedure Laterality Date   ANKLE SURGERY Left    TRANSCAROTID ARTERY REVASCULARIZATION  Right 04/12/2020   Procedure: RIGHT TRANSCAROTID ARTERY REVASCULARIZATION;  Surgeon: Maeola Harman, MD;  Location: [facility] OR;  Service: Vascular;  Laterality: Right;   ULTRASOUND GUIDANCE FOR VASCULAR ACCESS Left 04/12/2020   Procedure: ULTRASOUND GUIDANCE FOR VASCULAR ACCESS, left femoral vein;  Surgeon: Maeola Harman, MD;  Location: [facility] OR;  Service: Vascular;  Laterality: Left;       Home Medications    Prior to Admission medications   Medication Sig Start Date End Date Taking? Authorizing Provider  amLODipine (NORVASC) 10 MG tablet Take 10 mg by mouth daily.    [provider]  aspirin EC 81 MG EC tablet Take 1 tablet (81 mg total) by mouth daily. Swallow whole. 02/04/20   Lilland, Alana, DO  atorvastatin (LIPITOR) 80 MG tablet Take 80 mg by mouth at bedtime.    [provider]  cephALEXin (KEFLEX) 500 MG capsule Take 1,000 mg by mouth 2 (two) times daily.    [provider]  Cholecalciferol 25 MCG (1000 UT) tablet Take 1,000 Units by mouth daily.    [provider]  clopidogrel (PLAVIX) 75 MG tablet Take 75 mg by mouth daily.    [provider]  ferrous sulfate 325 (65 FE) MG tablet Take 325 mg by mouth  every Monday, Wednesday, and Friday.    [provider]  lisinopril (ZESTRIL) 10 MG tablet Take 10 mg by mouth daily.    [provider]  nystatin (MYCOSTATIN) 100000 UNIT/ML suspension Take 5 mLs by mouth 4 (four) times daily.    [provider]  oxyCODONE-acetaminophen (PERCOCET/ROXICET) 5-325 MG tablet Take 1-2 tablets by mouth every 4 (four) hours as needed for moderate pain. 04/13/20   Lars Mage, PA-C  PARoxetine (PAXIL) 40 MG tablet Take 20 mg by mouth every morning.    [provider]  prazosin (MINIPRESS) 1  MG capsule Take 1 capsule (1 mg total) by mouth at bedtime. 11/30/20   Ivette Loyal, NP    Family History No family history on file.  Social History Social History   Tobacco Use   Smoking status: Never   Smokeless tobacco: Never  Vaping Use   Vaping Use: Never used  Substance Use Topics   Alcohol use: Not Currently   Drug use: Never     Allergies   Trazodone   Review of Systems Review of Systems  Musculoskeletal:  Positive for back pain.  All other systems reviewed and are negative.   Physical Exam Triage Vital Signs ED Triage Vitals  Enc Vitals Group     BP 11/30/20 1222 (!) 124/96     Pulse Rate 11/30/20 1222 100     Resp 11/30/20 1222 (!) 22     Temp 11/30/20 1222 98.7 F (37.1 C)     Temp src --      SpO2 11/30/20 1222 100 %     Weight --      Height --      Head Circumference --      Peak Flow --      Pain Score 11/30/20 1221 10     Pain Loc --      Pain Edu? --      Excl. in GC? --    No data found.  Updated Vital Signs BP (!) 124/96 (BP Location: Right Arm)   Pulse 100   Temp 98.7 F (37.1 C)   Resp (!) 22   SpO2 100%   Visual Acuity Right Eye Distance:   Left Eye Distance:   Bilateral Distance:    Right Eye Near:   Left Eye Near:    Bilateral Near:     Physical Exam Vitals and nursing note reviewed.  Constitutional:      General: He is not in acute distress.    Appearance: Normal appearance. He is not ill-appearing, toxic-appearing or diaphoretic.  HENT:     Head: Normocephalic and atraumatic.  Eyes:     Conjunctiva/sclera: Conjunctivae normal.  Cardiovascular:     Rate and Rhythm: Normal rate.     Pulses: Normal pulses.  Pulmonary:     Effort: Pulmonary effort is normal.  Abdominal:     General: Abdomen is flat.     Tenderness: There is no right CVA tenderness or left CVA tenderness.  Musculoskeletal:        General: Normal range of motion.     Cervical back: Normal and normal range of motion.     Thoracic back:  Normal.     Lumbar back: Spasms and tenderness present. No bony tenderness.  Skin:    General: Skin is warm and dry.  Neurological:     General: No focal deficit present.     Mental Status: He is alert and oriented to person, place, and  time.  Psychiatric:        Mood and Affect: Mood normal.     UC Treatments / Results  Labs (all labs ordered are listed, but only abnormal results are displayed) Labs Reviewed  POCT URINALYSIS DIPSTICK, ED / UC - Abnormal; Notable for the following components:      Result Value   Bilirubin Urine MODERATE (*)    Ketones, ur 40 (*)    Hgb urine dipstick MODERATE (*)    Protein, ur >=300 (*)    All other components within normal limits    EKG   Radiology No results found.  Procedures Procedures (including critical care time)  Medications Ordered in UC Medications - No data to display  Initial Impression / Assessment and Plan / UC Course  I have reviewed the triage vital signs and the nursing notes.  Pertinent labs & imaging results that were available during my care of the patient were reviewed by me and considered in my medical decision making (see chart for details).    Assessment negative for red flags or concerns.  Urinalysis positive for bilirubin, ketones, hemoglobin, and protein.  No signs of infection but is concern for possible renal colic.  Patient reports that he does take Minipress but does not take it every single day.  Refill of Minipress sent and patient instructed to take it nightly.  Encourage fluids.  Tylenol and/or ibuprofen as needed.  Follow-up with primary care for reevaluation of soon as possible.  Strict ED follow-up for any worsening symptoms including worsening pain, inability to urinate, or frank hematuria. Final Clinical Impressions(s) / UC Diagnoses   Final diagnoses:  Acute left-sided low back pain without sciatica  Renal colic on left side     Discharge Instructions      Take the minipress every night.    Make sure you are drinking plenty of fluids, especially water.  You can take Tylenol and/or Ibuprofen as needed for pain relief and fever reduction.    Follow up with your primary care provider for re-evaluation as soon as possible.   If your pain gets worse, you are unable to urinate, or urinate bright red blood please go to the ED for further evaluation.      ED Prescriptions     Medication Sig Dispense Auth. Provider   prazosin (MINIPRESS) 1 MG capsule Take 1 capsule (1 mg total) by mouth at bedtime. 30 capsule Ivette Loyal, NP      PDMP not reviewed this encounter.   Ivette Loyal, NP 11/30/20 1321

## 2020-12-01 ENCOUNTER — Emergency Department (HOSPITAL_COMMUNITY): Payer: No Typology Code available for payment source

## 2020-12-01 ENCOUNTER — Inpatient Hospital Stay (HOSPITAL_COMMUNITY): Payer: No Typology Code available for payment source

## 2020-12-01 ENCOUNTER — Inpatient Hospital Stay (HOSPITAL_COMMUNITY)
Admission: EM | Admit: 2020-12-01 | Discharge: 2020-12-06 | DRG: 065 | Disposition: A | Discharge status: Rehabilitation Facility | Payer: No Typology Code available for payment source | Attending: Internal Medicine | Admitting: Internal Medicine

## 2020-12-01 ENCOUNTER — Encounter (HOSPITAL_COMMUNITY): Payer: Self-pay | Admitting: *Deleted

## 2020-12-01 ENCOUNTER — Other Ambulatory Visit: Payer: Self-pay

## 2020-12-01 DIAGNOSIS — R471 Dysarthria and anarthria: Secondary | ICD-10-CM | POA: Diagnosis present

## 2020-12-01 DIAGNOSIS — I6503 Occlusion and stenosis of bilateral vertebral arteries: Secondary | ICD-10-CM | POA: Diagnosis present

## 2020-12-01 DIAGNOSIS — I69351 Hemiplegia and hemiparesis following cerebral infarction affecting right dominant side: Secondary | ICD-10-CM

## 2020-12-01 DIAGNOSIS — Z9114 Patient's other noncompliance with medication regimen: Secondary | ICD-10-CM | POA: Diagnosis not present

## 2020-12-01 DIAGNOSIS — I6522 Occlusion and stenosis of left carotid artery: Secondary | ICD-10-CM | POA: Diagnosis present

## 2020-12-01 DIAGNOSIS — Z79899 Other long term (current) drug therapy: Secondary | ICD-10-CM | POA: Diagnosis not present

## 2020-12-01 DIAGNOSIS — R29702 NIHSS score 2: Secondary | ICD-10-CM | POA: Diagnosis present

## 2020-12-01 DIAGNOSIS — R933 Abnormal findings on diagnostic imaging of other parts of digestive tract: Secondary | ICD-10-CM

## 2020-12-01 DIAGNOSIS — K922 Gastrointestinal hemorrhage, unspecified: Secondary | ICD-10-CM | POA: Diagnosis not present

## 2020-12-01 DIAGNOSIS — F1010 Alcohol abuse, uncomplicated: Secondary | ICD-10-CM | POA: Diagnosis not present

## 2020-12-01 DIAGNOSIS — N179 Acute kidney failure, unspecified: Secondary | ICD-10-CM | POA: Diagnosis present

## 2020-12-01 DIAGNOSIS — Z87891 Personal history of nicotine dependence: Secondary | ICD-10-CM

## 2020-12-01 DIAGNOSIS — I10 Essential (primary) hypertension: Secondary | ICD-10-CM

## 2020-12-01 DIAGNOSIS — R531 Weakness: Secondary | ICD-10-CM

## 2020-12-01 DIAGNOSIS — Z91199 Patient's noncompliance with other medical treatment and regimen due to unspecified reason: Secondary | ICD-10-CM

## 2020-12-01 DIAGNOSIS — R7401 Elevation of levels of liver transaminase levels: Secondary | ICD-10-CM | POA: Diagnosis not present

## 2020-12-01 DIAGNOSIS — I776 Arteritis, unspecified: Secondary | ICD-10-CM | POA: Diagnosis present

## 2020-12-01 DIAGNOSIS — Z7982 Long term (current) use of aspirin: Secondary | ICD-10-CM

## 2020-12-01 DIAGNOSIS — I7389 Other specified peripheral vascular diseases: Secondary | ICD-10-CM | POA: Diagnosis present

## 2020-12-01 DIAGNOSIS — Z888 Allergy status to other drugs, medicaments and biological substances status: Secondary | ICD-10-CM | POA: Diagnosis not present

## 2020-12-01 DIAGNOSIS — E119 Type 2 diabetes mellitus without complications: Secondary | ICD-10-CM | POA: Diagnosis present

## 2020-12-01 DIAGNOSIS — M545 Low back pain, unspecified: Secondary | ICD-10-CM | POA: Diagnosis present

## 2020-12-01 DIAGNOSIS — R350 Frequency of micturition: Secondary | ICD-10-CM | POA: Diagnosis present

## 2020-12-01 DIAGNOSIS — R29810 Facial weakness: Secondary | ICD-10-CM | POA: Diagnosis present

## 2020-12-01 DIAGNOSIS — D72829 Elevated white blood cell count, unspecified: Secondary | ICD-10-CM | POA: Diagnosis present

## 2020-12-01 DIAGNOSIS — Z20822 Contact with and (suspected) exposure to covid-19: Secondary | ICD-10-CM | POA: Diagnosis present

## 2020-12-01 DIAGNOSIS — R319 Hematuria, unspecified: Secondary | ICD-10-CM | POA: Diagnosis present

## 2020-12-01 DIAGNOSIS — Z7902 Long term (current) use of antithrombotics/antiplatelets: Secondary | ICD-10-CM

## 2020-12-01 DIAGNOSIS — G8929 Other chronic pain: Secondary | ICD-10-CM | POA: Diagnosis not present

## 2020-12-01 DIAGNOSIS — E860 Dehydration: Secondary | ICD-10-CM | POA: Diagnosis present

## 2020-12-01 DIAGNOSIS — R0989 Other specified symptoms and signs involving the circulatory and respiratory systems: Secondary | ICD-10-CM | POA: Diagnosis not present

## 2020-12-01 DIAGNOSIS — I639 Cerebral infarction, unspecified: Secondary | ICD-10-CM | POA: Diagnosis not present

## 2020-12-01 DIAGNOSIS — K5901 Slow transit constipation: Secondary | ICD-10-CM | POA: Diagnosis not present

## 2020-12-01 DIAGNOSIS — N183 Chronic kidney disease, stage 3 unspecified: Secondary | ICD-10-CM | POA: Diagnosis not present

## 2020-12-01 DIAGNOSIS — E785 Hyperlipidemia, unspecified: Secondary | ICD-10-CM

## 2020-12-01 DIAGNOSIS — I6389 Other cerebral infarction: Secondary | ICD-10-CM | POA: Diagnosis present

## 2020-12-01 DIAGNOSIS — R296 Repeated falls: Secondary | ICD-10-CM | POA: Diagnosis present

## 2020-12-01 LAB — COMPREHENSIVE METABOLIC PANEL
ALT: 16 U/L (ref 0–44)
AST: 24 U/L (ref 15–41)
Albumin: 4.5 g/dL (ref 3.5–5.0)
Alkaline Phosphatase: 81 U/L (ref 38–126)
Anion gap: 12 (ref 5–15)
BUN: 24 mg/dL — ABNORMAL HIGH (ref 8–23)
CO2: 24 mmol/L (ref 22–32)
Calcium: 9.7 mg/dL (ref 8.9–10.3)
Chloride: 101 mmol/L (ref 98–111)
Creatinine, Ser: 1.63 mg/dL — ABNORMAL HIGH (ref 0.61–1.24)
GFR, Estimated: 46 mL/min — ABNORMAL LOW (ref >60)
Glucose, Bld: 107 mg/dL — ABNORMAL HIGH (ref 70–99)
Potassium: 3.4 mmol/L — ABNORMAL LOW (ref 3.5–5.1)
Sodium: 137 mmol/L (ref 135–145)
Total Bilirubin: 1.2 mg/dL (ref 0.3–1.2)
Total Protein: 8.7 g/dL — ABNORMAL HIGH (ref 6.5–8.1)

## 2020-12-01 LAB — I-STAT CHEM 8, ED
BUN: 29 mg/dL — ABNORMAL HIGH (ref 8–23)
Calcium, Ion: 1.13 mmol/L — ABNORMAL LOW (ref 1.15–1.40)
Chloride: 103 mmol/L (ref 98–111)
Creatinine, Ser: 1.6 mg/dL — ABNORMAL HIGH (ref 0.61–1.24)
Glucose, Bld: 104 mg/dL — ABNORMAL HIGH (ref 70–99)
HCT: 50 % (ref 39.0–52.0)
Hemoglobin: 17 g/dL (ref 13.0–17.0)
Potassium: 3.6 mmol/L (ref 3.5–5.1)
Sodium: 140 mmol/L (ref 135–145)
TCO2: 26 mmol/L (ref 22–32)

## 2020-12-01 LAB — RAPID URINE DRUG SCREEN, HOSP PERFORMED
Amphetamines: NOT DETECTED
Barbiturates: NOT DETECTED
Benzodiazepines: NOT DETECTED
Cocaine: NOT DETECTED
Opiates: NOT DETECTED
Tetrahydrocannabinol: POSITIVE — AB

## 2020-12-01 LAB — URINALYSIS, ROUTINE W REFLEX MICROSCOPIC
Bacteria, UA: NONE SEEN
Bilirubin Urine: NEGATIVE
Glucose, UA: NEGATIVE mg/dL
Ketones, ur: 80 mg/dL — AB
Leukocytes,Ua: NEGATIVE
Nitrite: NEGATIVE
Protein, ur: 30 mg/dL — AB
Specific Gravity, Urine: >1.046 — ABNORMAL HIGH (ref 1.005–1.030)
pH: 5 (ref 5.0–8.0)

## 2020-12-01 LAB — PROTIME-INR
INR: 1 (ref 0.8–1.2)
Prothrombin Time: 13.4 seconds (ref 11.4–15.2)

## 2020-12-01 LAB — DIFFERENTIAL
Abs Immature Granulocytes: 0.04 10*3/uL (ref 0.00–0.07)
Basophils Absolute: 0 10*3/uL (ref 0.0–0.1)
Basophils Relative: 0 %
Eosinophils Absolute: 0 10*3/uL (ref 0.0–0.5)
Eosinophils Relative: 0 %
Immature Granulocytes: 0 %
Lymphocytes Relative: 25 %
Lymphs Abs: 3.4 10*3/uL (ref 0.7–4.0)
Monocytes Absolute: 1.2 10*3/uL — ABNORMAL HIGH (ref 0.1–1.0)
Monocytes Relative: 9 %
Neutro Abs: 9 10*3/uL — ABNORMAL HIGH (ref 1.7–7.7)
Neutrophils Relative %: 66 %

## 2020-12-01 LAB — RESP PANEL BY RT-PCR (FLU A&B, COVID) ARPGX2
Influenza A by PCR: NEGATIVE
Influenza B by PCR: NEGATIVE
SARS Coronavirus 2 by RT PCR: NEGATIVE

## 2020-12-01 LAB — TROPONIN I (HIGH SENSITIVITY)
Troponin I (High Sensitivity): 47 ng/L — ABNORMAL HIGH (ref <18)
Troponin I (High Sensitivity): 44 ng/L — ABNORMAL HIGH (ref <18)

## 2020-12-01 LAB — CBC
HCT: 49.5 % (ref 39.0–52.0)
Hemoglobin: 16.5 g/dL (ref 13.0–17.0)
MCH: 30.5 pg (ref 26.0–34.0)
MCHC: 33.3 g/dL (ref 30.0–36.0)
MCV: 91.5 fL (ref 80.0–100.0)
Platelets: 354 10*3/uL (ref 150–400)
RBC: 5.41 MIL/uL (ref 4.22–5.81)
RDW: 14.6 % (ref 11.5–15.5)
WBC: 13.8 10*3/uL — ABNORMAL HIGH (ref 4.0–10.5)
nRBC: 0 % (ref 0.0–0.2)

## 2020-12-01 LAB — D-DIMER, QUANTITATIVE: D-Dimer, Quant: 1.28 ug/mL-FEU — ABNORMAL HIGH (ref 0.00–0.50)

## 2020-12-01 LAB — SEDIMENTATION RATE: Sed Rate: 27 mm/hr — ABNORMAL HIGH (ref 0–16)

## 2020-12-01 LAB — C-REACTIVE PROTEIN: CRP: 2.5 mg/dL — ABNORMAL HIGH (ref <1.0)

## 2020-12-01 LAB — APTT: aPTT: 34 seconds (ref 24–36)

## 2020-12-01 LAB — CBG MONITORING, ED: Glucose-Capillary: 89 mg/dL (ref 70–99)

## 2020-12-01 LAB — LACTIC ACID, PLASMA: Lactic Acid, Venous: 1.1 mmol/L (ref 0.5–1.9)

## 2020-12-01 MED ORDER — LIDOCAINE 5 % EX PTCH
1.0000 | MEDICATED_PATCH | CUTANEOUS | Status: DC
  Administered 2020-12-01 – 2020-12-05 (×6): 1 via TRANSDERMAL
  Filled 2020-12-01 (×6): qty 1

## 2020-12-01 MED ORDER — SODIUM CHLORIDE 0.9 % IV SOLN: Freq: Once | INTRAVENOUS | Status: AC

## 2020-12-01 MED ORDER — PAROXETINE HCL 20 MG PO TABS
20.0000 mg | ORAL_TABLET | Freq: Every day | ORAL | Status: DC
  Administered 2020-12-02 – 2020-12-06 (×5): 20 mg via ORAL
  Filled 2020-12-01 (×5): qty 1

## 2020-12-01 MED ORDER — METHOCARBAMOL 500 MG PO TABS
500.0000 mg | ORAL_TABLET | Freq: Three times a day (TID) | ORAL | Status: DC | PRN
  Administered 2020-12-01 – 2020-12-04 (×4): 500 mg via ORAL
  Filled 2020-12-01 (×4): qty 1

## 2020-12-01 MED ORDER — LACTATED RINGERS IV SOLN: INTRAVENOUS | Status: AC

## 2020-12-01 MED ORDER — ACETAMINOPHEN 650 MG RE SUPP: 650.0000 mg | Freq: Four times a day (QID) | RECTAL | Status: DC | PRN

## 2020-12-01 MED ORDER — ATORVASTATIN CALCIUM 80 MG PO TABS
80.0000 mg | ORAL_TABLET | Freq: Every day | ORAL | Status: DC
  Administered 2020-12-01 – 2020-12-05 (×5): 80 mg via ORAL
  Filled 2020-12-01 (×5): qty 1

## 2020-12-01 MED ORDER — IOHEXOL 350 MG/ML SOLN
100.0000 mL | Freq: Once | INTRAVENOUS | Status: AC | PRN
  Administered 2020-12-01: 100 mL via INTRAVENOUS

## 2020-12-01 MED ORDER — CLOPIDOGREL BISULFATE 75 MG PO TABS
75.0000 mg | ORAL_TABLET | Freq: Every day | ORAL | Status: DC
  Administered 2020-12-01 – 2020-12-06 (×6): 75 mg via ORAL
  Filled 2020-12-01 (×6): qty 1

## 2020-12-01 MED ORDER — SODIUM CHLORIDE 0.9% FLUSH
3.0000 mL | Freq: Once | INTRAVENOUS | Status: AC
  Administered 2020-12-01: 3 mL via INTRAVENOUS

## 2020-12-01 MED ORDER — HEPARIN SODIUM (PORCINE) 5000 UNIT/ML IJ SOLN
5000.0000 [IU] | Freq: Three times a day (TID) | INTRAMUSCULAR | Status: DC
  Administered 2020-12-01 – 2020-12-06 (×15): 5000 [IU] via SUBCUTANEOUS
  Filled 2020-12-01 (×15): qty 1

## 2020-12-01 MED ORDER — ACETAMINOPHEN 325 MG PO TABS
650.0000 mg | ORAL_TABLET | Freq: Four times a day (QID) | ORAL | Status: DC | PRN
  Administered 2020-12-02 – 2020-12-04 (×4): 650 mg via ORAL
  Filled 2020-12-01 (×4): qty 2

## 2020-12-01 MED ORDER — ASPIRIN EC 81 MG PO TBEC
81.0000 mg | DELAYED_RELEASE_TABLET | Freq: Every day | ORAL | Status: DC
  Administered 2020-12-01 – 2020-12-06 (×6): 81 mg via ORAL
  Filled 2020-12-01 (×6): qty 1

## 2020-12-01 NOTE — Consult Note (Signed)
NEURO HOSPITALIST CONSULT NOTE   Requestig physician: Dr. Mikey Bussing  Reason for Consult: Small acute ischemic infarctions seen on MRI brain  History obtained from:   Patient and Chart     HPI:                                                                                                                                          Alec Sanchez is an 65 y.o. male with a PMHx of stroke, carotid artery occlusion, carotid artery stenosis s/p CEA, HLD and HTN who presented via EMS to the [facility] ED on Friday afternoon after family called for concerns of a stroke. He also has had right flank pain for a couple of days and had been evaluated at urgent care for this on Thursday afternoon; urinalysis was positive for hematuria; he was diagnosed with renal colic and discharged home. Upon arrival to home yesterday, patient was reportedly noted to have some slurring of his speech and right-sided facial droop. Daughter later mentioned to IM resident that the patient's first neurological deficits were noticed on Tuesday, at which time he had drooping of face, slurred speech worse than his baseline, AMS, loss of bowel and bladder continence, slowing of movement and speech, and difficulty holding things in his right hand. Patient later endorsed to IM resident that he had fallen twice on Tuesday and Wednesday and injured his back during the latter fall. On Friday, family had tried to contact patient by phone, but he did not answer, so EMS was called. On arrival to the ED, he was A&Ox3, appeared generally weak and reported not being able to sleep due to flank pain for a couple of days. Exam in the ED revealed that he had generalized weakness in all extremities and his speech seemed slurred. He was noted to be hypertensive, and admitted that he had not taken any of his medications on the day of presentation.  SBPs have been in the high 160's to 202 this admission.   In the ED, an MRI was obtained, revealing 3  small acute ischemic infarctions with locations and sizes that were most consistent with small vessel strokes. Labs showed AKI. 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  He states that for a while, he has not been fully compliant with his antihypertensive medications.   Home medications include ASA, Plavix and atorvastatin.    Past Medical History:  Diagnosis Date   Anxiety    Carotid artery occlusion    Depression    Hyperlipidemia    Hypertension    Stroke [facility])     Past Surgical History:  Procedure Laterality Date   ANKLE SURGERY Left    TRANSCAROTID ARTERY REVASCULARIZATION  Right 04/12/2020   Procedure: RIGHT TRANSCAROTID ARTERY REVASCULARIZATION;  Surgeon: Maeola Harman, MD;  Location: [facility] OR;  Service: Vascular;  Laterality: Right;   ULTRASOUND GUIDANCE FOR VASCULAR ACCESS Left 04/12/2020   Procedure: ULTRASOUND GUIDANCE FOR VASCULAR ACCESS, left femoral vein;  Surgeon: Maeola Harman, MD;  Location: [facility] OR;  Service: Vascular;  Laterality: Left;    No family history on file.          Social History:  reports that he has never smoked. He has never used smokeless tobacco. He reports that he does not currently use alcohol. He reports that he does not use drugs.  Allergies  Allergen Reactions   Trazodone Nausea And Vomiting    MEDICATIONS:                                                                                                                     Prior to Admission:  Medications Prior to Admission  Medication Sig Dispense Refill Last Dose   amLODipine (NORVASC) 10 MG tablet Take 10 mg by mouth every morning.   10/3 or 10/4   aspirin EC 325 MG tablet Take 325 mg by mouth daily.   10/3 or 10/4   atorvastatin (LIPITOR) 80 MG tablet Take 80 mg by mouth at bedtime.   10/3 or 10/4   chlorthalidone (HYGROTON) 25 MG tablet Take 25 mg by mouth daily. For high blood pressure   10/3 or 10/4   Cholecalciferol 25 MCG  (1000 UT) tablet Take 1,000 Units by mouth every morning.   10/3 or 10/4   clopidogrel (PLAVIX) 75 MG tablet Take 75 mg by mouth daily.   10/4 or 10/4   ferrous sulfate 325 (65 FE) MG tablet Take 325 mg by mouth every Monday, Wednesday, and Friday.   10/3 or 10/4   lidocaine (LIDODERM) 5 % Place 1 patch onto the skin daily. Remove & Discard patch within 12 hours or as directed by MD   10/3 or 10/4   lisinopril (ZESTRIL) 10 MG tablet Take 30 mg by mouth at bedtime.   10/3 or 10/4   naproxen (NAPROSYN) 500 MG tablet Take 500 mg by mouth daily as needed (back pain). Take with food and 16 oz water   few weeks ago   PARoxetine (PAXIL) 40 MG tablet Take 20 mg by mouth every morning. For mental health   10/3 or 10/4   prazosin (MINIPRESS) 1 MG capsule Take 1 capsule (1 mg total) by mouth at bedtime. (Patient taking differently: Take 1 mg by mouth at bedtime. For enlarged prostate and for nightmares) 30 capsule 0 10/3 or 10/4   sildenafil (VIAGRA) 100 MG tablet Take 100 mg by mouth daily as needed for erectile dysfunction (do not take within 6 hours of taking prazosin).   week ago   aspirin EC 81 MG EC tablet Take 1 tablet (81 mg total) by mouth daily. Swallow whole. (Patient not taking: No sig reported) 30 tablet 11 Not Taking   nicotine (NICODERM CQ - DOSED IN MG/24 HOURS) 21 mg/24hr patch  Place 21 mg onto the skin daily. (Patient not taking: Reported on 12/01/2020)   Not Taking   nicotine polacrilex (NICORETTE) 2 MG gum Take 2 mg by mouth See admin instructions. Chew one piece (2 mg) in mouth as directed for smoking cessation: Weeks 1-6 - chew one piece of gum (2 mg) every 1-2 hours (max 24 pieces/day); to increase chances of quitting, chew at least 9 pieces/day during the first 6 weeks; weeks 7-9 - chew one piece of gum (2 mg) every 2-4 hours (max 24 pieces/day); weeks 10-12 - chew one piece of gum (2 mg) every 4-8 hours (max 24 pieces/day) (Patient not taking: Reported on 12/01/2020)   Not Taking    oxyCODONE-acetaminophen (PERCOCET/ROXICET) 5-325 MG tablet Take 1-2 tablets by mouth every 4 (four) hours as needed for moderate pain. (Patient not taking: No sig reported) 12 tablet 0 Not Taking   Scheduled:  aspirin EC  81 mg Oral Daily   atorvastatin  80 mg Oral QHS   clopidogrel  75 mg Oral Daily   heparin  5,000 Units Subcutaneous Q8H   lidocaine  1 patch Transdermal Q24H   PARoxetine  20 mg Oral Daily   Continuous:  lactated ringers 100 mL/hr at 12/01/20 2314     ROS:                                                                                                                                       As per HPI.    Blood pressure (!) 187/94, pulse 71, temperature 98.5 F (36.9 C), temperature source Oral, resp. rate (!) 21, SpO2 98 %.   General Examination:                                                                                                       Physical Exam  HEENT-  [place]/AT    Lungs- Respirations unlabored Extremities- No edema  Neurological Examination Mental Status: Awake and alert. Speech is fluent with intact comprehension and naming. Oriented x 5.  Cranial Nerves: II: Temporal visual fields intact with no extinction to DSS.   III,IV, VI: EOMI. No nystagmus. No ptosis.  V,VII: Smile symmetric, facial temp sensation equal bilaterally VIII: hearing intact to voice IX,X: No hypophonia XI: Symmetric XII: Midline tongue extension Motor: Right : Upper extremity   5/5    Left:     Upper extremity   5/5  Lower extremity   5/5     Lower extremity   5/5  Sensory: Temp intact throughout, bilaterally. No extinction to DSS.  Deep Tendon Reflexes: Unremarkable Cerebellar: No ataxia with FNF bilaterally  Gait: Deferred   Lab Results: Basic Metabolic Panel: Recent Labs  Lab 12/01/20 0041 12/01/20 0134  NA 137 140  K 3.4* 3.6  CL 101 103  CO2 24  --   GLUCOSE 107* 104*  BUN 24* 29*  CREATININE 1.63* 1.60*  CALCIUM 9.7  --     CBC: Recent Labs  Lab  12/01/20 0041 12/01/20 0134  WBC 13.8*  --   NEUTROABS 9.0*  --   HGB 16.5 17.0  HCT 49.5 50.0  MCV 91.5  --   PLT 354  --     Cardiac Enzymes: No results for input(s): CKTOTAL, CKMB, CKMBINDEX, TROPONINI in the last 168 hours.  Lipid Panel: No results for input(s): CHOL, TRIG, HDL, CHOLHDL, VLDL, LDLCALC in the last 168 hours.  Imaging: CT ANGIO HEAD NECK W WO CM  Result Date: 12/01/2020 CLINICAL DATA:  Neuro deficit, acute, stroke suspected. Slurred speech. EXAM: CT ANGIOGRAPHY HEAD AND NECK TECHNIQUE: Multidetector CT imaging of the head and neck was performed using the standard protocol during bolus administration of intravenous contrast. Multiplanar CT image reconstructions and MIPs were obtained to evaluate the vascular anatomy. Carotid stenosis measurements (when applicable) are obtained utilizing NASCET criteria, using the distal internal carotid diameter as the denominator. CONTRAST:  OMNIPAQUE IOHEXOL 350 MG/ML SOLN COMPARISON:  Head and neck CTA 01/31/2020 FINDINGS: CTA NECK FINDINGS Aortic arch: Standard 3 vessel aortic arch with mixed calcified and soft plaque and with detailed evaluation of the aorta performed on separate chest CTA. Patent brachiocephalic and subclavian arteries without significant stenosis. Right carotid system: Patent without evidence of a significant stenosis or dissection. Interval carotid stenting extending from the distal common into the proximal internal carotid arteries. Left carotid system: Patent with soft plaque in the mid common carotid artery not resulting in significant stenosis. Mixed calcified and soft, mildly ulcerated plaque in the left carotid bulb results in approximately 60% stenosis, unchanged. Vertebral arteries: The left vertebral artery is patent and dominant with predominantly soft plaque in the proximal V1 segment resulting in progressive, severe stenosis (series 10, image 106). There is unchanged occlusion of the right vertebral  artery at its origin with reconstitution of small distal V2 and V3 segments. A severe stenosis is again noted of the distal right V3 segment. Skeleton: Focally advanced cervical disc degeneration at C5-6. Moderate left facet arthrosis at C4-5. Other neck: No evidence of cervical lymphadenopathy or mass. Upper chest: Reported separately. Review of the MIP images confirms the above findings CTA HEAD FINDINGS Anterior circulation: The internal carotid arteries are patent from skull base to carotid termini with similar appearance of mild left cavernous and mild to moderate bilateral paraclinoid stenoses. ACAs and MCAs are patent without evidence of a proximal branch occlusion or significant proximal stenosis. There is an unchanged mild left M1 stenosis. Mild-to-moderate branch vessel irregularity is noted bilaterally. No aneurysm is identified. Posterior circulation: The intracranial vertebral arteries are patent to the basilar. The left V4 segment is mildly irregular without significant stenosis. The right V4 segment is small with superimposed moderate to severe stenoses. Patent PICA, AICA, and SCA origins are identified bilaterally. The basilar artery is widely patent. Posterior communicating arteries are diminutive or absent. The PCAs are patent with moderate branch vessel irregularity but no flow limiting proximal stenosis. No aneurysm is identified. Venous sinuses: Not well evaluated due to arterial contrast timing. Anatomic  variants: Dominant left vertebral artery. Review of the MIP images confirms the above findings IMPRESSION: 1. No emergent large vessel occlusion. 2. Interval right carotid stenting without residual stenosis. 3. Unchanged 60% proximal left ICA stenosis. 4. 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 5. Progressive, severe proximal left vertebral artery stenosis. 6. Unchanged mild to moderate bilateral intracranial ICA stenoses and mild left M1  stenosis. 7. Aortic Atherosclerosis (ICD10-I70.0). Electronically Signed   By: Sebastian Ache M.D.   On: 12/01/2020 16:32   CT HEAD WO CONTRAST  Result Date: 12/01/2020 CLINICAL DATA:  Neuro deficit, generalized weakness EXAM: CT HEAD WITHOUT CONTRAST TECHNIQUE: Contiguous axial images were obtained from the base of the skull through the vertex without intravenous contrast. COMPARISON:  03/10/2020 FINDINGS: Brain: No evidence of acute infarction, hemorrhage, hydrocephalus, extra-axial collection or mass lesion/mass effect. Chronic atrophic and ischemic changes are noted stable in appearance from the prior exam. Vascular: No hyperdense vessel or unexpected calcification. Skull: Normal. Negative for fracture or focal lesion. Sinuses/Orbits: No acute finding. Other: None. IMPRESSION: Chronic atrophic and ischemic changes stable from the prior exam. Electronically Signed   By: Alcide Clever M.D.   On: 12/01/2020 01:46   MR BRAIN WO CONTRAST  Result Date: 12/01/2020 CLINICAL DATA:  Follow-up examination for acute stroke. EXAM: MRI HEAD WITHOUT CONTRAST TECHNIQUE: Multiplanar, multiecho pulse sequences of the brain and surrounding structures were obtained without intravenous contrast. COMPARISON:  Prior CTA from earlier the same day. FINDINGS: Brain: Diffuse prominence of the CSF containing spaces compatible with generalized cerebral atrophy. Patchy and confluent T2/FLAIR hyperintensity involving the periventricular and deep white matter both cerebral hemispheres most consistent with chronic small vessel ischemic disease, moderate to advanced in nature. Remote lacunar infarcts present at the left greater than right basal ganglia and thalami. Appearance is progressed from prior. Three distinct foci of restricted diffusion all measuring approximately 8 mm each seen involving the right anterior genu of the corpus callosum (series 5, image 87), periventricular white matter of the right posterior corona radiata (series 5,  image 84), and right lentiform nucleus (series 5, image 78). No associated hemorrhage or mass effect. No other diffusion abnormality to suggest acute or subacute ischemia. Additionally, there is patchy T2/FLAIR signal abnormality involving primarily the subcortical aspect of the posterior right frontoparietal region (series 11, image 20), with extension to involve the parasagittal right frontal region near the vertex (series 11, image 22). No associated diffusion abnormality or significant mass effect. Finding is nonspecific, but new as compared to prior MRI, and could reflect changes subacute ischemia, somewhat watershed in distribution, possibly related to previously identified severe right ICA stenosis. Possible PRES could also conceivably have this appearance. No evidence for acute intracranial hemorrhage. Multiple scattered punctate chronic micro hemorrhages noted, primarily clustered about the deep gray nuclei, likely related to chronic poorly controlled hypertension. No mass lesion, midline shift or mass effect. Mild ventricular prominence related global parenchymal volume loss of hydrocephalus. No extra-axial fluid collection. Pituitary gland suprasellar region normal. Midline structures intact. Vascular: Major intracranial vascular flow voids are maintained. Skull and upper cervical spine: Craniocervical junction within normal limits. Bone marrow signal intensity within normal limits. No scalp soft tissue abnormality. Sinuses/Orbits: Globes orbital soft tissues within normal limits. Paranasal sinuses are largely clear. No significant mastoid effusion. Inner ear structures grossly normal. Other: None. IMPRESSION: 1. Three distinct 8 mm acute ischemic infarcts involving the right anterior genu of the corpus callosum, periventricular white matter of the right  posterior corona radiata, and right lentiform nucleus as above. No associated hemorrhage or mass effect. 2. Scattered T2/FLAIR signal abnormality  involving the subcortical aspects of the right frontal and parietal regions as above, new as compared to prior MRI from 01/31/2020. Finding is nonspecific, but favored to reflect changes of subacute ischemia, possibly related to previously identified severe right ICA stenosis given the somewhat watershed distribution. Possible PRES could also conceivably have this appearance, and could be considered in the correct clinical setting. 3. Underlying age-related cerebral atrophy with advanced chronic microvascular ischemic disease, progressed from prior. Electronically Signed   By: Rise Mu M.D.   On: 12/01/2020 22:44   CT Angio Chest/Abd/Pel for Dissection W and/or Wo Contrast  Result Date: 12/01/2020 CLINICAL DATA:  Right flank pain, abdominal pain, slurred speech EXAM: CT ANGIOGRAPHY CHEST, ABDOMEN AND PELVIS TECHNIQUE: Non-contrast CT of the chest was initially obtained. Multidetector CT imaging through the chest, abdomen and pelvis was performed using the standard protocol during bolus administration of intravenous contrast. Multiplanar reconstructed images and MIPs were obtained and reviewed to evaluate the vascular anatomy. CONTRAST:  OMNIPAQUE IOHEXOL 350 MG/ML SOLN COMPARISON:  None. FINDINGS: CTA CHEST FINDINGS Cardiovascular: The heart and great vessels are grossly unremarkable without pericardial effusion. Mild atherosclerosis of the coronary vasculature. There is no evidence of thoracic aortic aneurysm or dissection. Moderate atherosclerosis of the aortic arch is noted, with mild narrowing at the origin of the innominate and left common carotid arteries. No filling defects or pulmonary emboli. Mediastinum/Nodes: No enlarged mediastinal, hilar, or axillary lymph nodes. Thyroid gland, trachea, and esophagus demonstrate no significant findings. Lungs/Pleura: No acute airspace disease, effusion, or pneumothorax. The central airways are patent. Musculoskeletal: No acute or destructive bony  lesions. Cortical and trabecular thickening of the left first rib may reflect underlying Paget disease. Reconstructed images demonstrate no additional findings. Review of the MIP images confirms the above findings. CTA ABDOMEN AND PELVIS FINDINGS VASCULAR Aorta: Normal caliber aorta without aneurysm, dissection, or significant stenosis. There is moderate multifocal atherosclerosis. At the level of the IMA, there is mild fat stranding along the ventral aspect of the aorta extending to the root of the IMA. This could reflect acute IMA thrombosis or underlying vasculitis. Celiac: Patent without evidence of aneurysm, dissection, vasculitis or significant stenosis. SMA: Patent without evidence of aneurysm, dissection, vasculitis or significant stenosis. Mild atherosclerosis. Renals: There are single bilateral renal arteries. On the left, there is mild to moderate atherosclerosis with approximately 50% stenosis in the distal left renal artery at the hilum. On the right, there is diffuse atheromatous plaque extending from the origin for approximately 2.5 cm in length, resulting in multifocal high-grade stenoses estimated 50-70%. No evidence of dissection aneurysm, or vasculitis. IMA: The IMA is occluded at its origin, with perivascular fat stranding suggesting possible acute IMA occlusion or vasculitis. No evidence of aneurysm or dissection. Distal branches of the IMA enhance normally via collateral flow. Inflow: Patent without evidence of aneurysm, dissection, vasculitis or significant stenosis. Mild to moderate atherosclerosis within the right common iliac artery without high-grade stenosis. Veins: No obvious venous abnormality within the limitations of this arterial phase study. Review of the MIP images confirms the above findings. NON-VASCULAR Hepatobiliary: No focal liver abnormality is seen. No gallstones, gallbladder wall thickening, or biliary dilatation. Pancreas: Unremarkable. No pancreatic ductal dilatation or  surrounding inflammatory changes. Spleen: Normal in size without focal abnormality. Adrenals/Urinary Tract: Mild right renal cortical atrophy may reflect sequela of underlying atherosclerosis in the right renal artery  described above. Left kidney is unremarkable. No evidence of nephrolithiasis or obstructive uropathy within either kidney. The adrenals and bladder are grossly unremarkable. Minimal excreted contrast within the left ureter and bladder. Stomach/Bowel: No bowel obstruction or ileus. Normal appendix right lower quadrant. No bowel wall thickening or inflammatory change. Specifically, no evidence of distal colonic wall thickening to suggest underlying ischemia. Lymphatic: No pathologic adenopathy within the abdomen or pelvis. Reproductive: Prostate is unremarkable. Other: No free fluid or free gas.  No abdominal wall hernia. Musculoskeletal: No acute or destructive bony lesions. Reconstructed images demonstrate no additional findings. Review of the MIP images confirms the above findings. IMPRESSION: 1. 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 Findings could reflect acute IMA occlusion or underlying vasculitis. The distal branches of the IMA enhance via collateral flow, with no evidence of bowel ischemia within the distal colon. 2. No evidence of thoracoabdominal aortic aneurysm or dissection. 3. Multifocal atherosclerosis with significant stenoses at the origin of the innominate artery, left common carotid artery, and right renal artery as above. 4. No evidence of pulmonary embolus. 5. Otherwise no acute intrathoracic, intra-abdominal, or intrapelvic process. 6. 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 7.  Aortic Atherosclerosis (ICD10-I70.0). Critical Value/emergent results were called by telephone at the time of interpretation on 12/01/2020 at 4:41 pm to provider [facility], who verbally  acknowledged these results. Electronically Signed   By: Sharlet Salina M.D.   On: 12/01/2020 16:45     Assessment: 65 year old hypertensive male presenting with stroke-like symptoms. MRI revealed 3 small vessel acute ischemic infarctions. 1. Exam is nonfocal.  2. MRI brain: Three distinct 8 mm acute ischemic infarcts involving the right anterior genu of the corpus callosum, periventricular white matter of the right posterior corona radiata, and right lentiform nucleus as above. No associated hemorrhage or mass effect. Scattered T2/FLAIR signal abnormality involving the subcortical aspects of the right frontal and parietal regions as above, new as compared to prior MRI from 01/31/2020. 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 Also noted is underlying age-related cerebral atrophy with advanced chronic microvascular ischemic disease, progressed from prior. 3. CTA of head and neck: No emergent large vessel occlusion. Interval right carotid stenting without residual stenosis. Unchanged 60% proximal left ICA stenosis. 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 Progressive, severe proximal left vertebral artery stenosis. Unchanged mild to moderate bilateral intracranial ICA stenoses and mild left M1 stenosis. Aortic Atherosclerosis 4. Malignant hypertension with associated vasospasm is felt to be the most likely etiology for the patient's small-vessel strokes. The locations would be atypical for embolic phenomena.   Recommendations: 1. BP management. Out of the permissive HTN time window. Goal SBP 120-140 is recommended. 2.  2. Continue home ASA, Plavix and atorvastatin.  3. PT consult, OT consult, Speech consult 4. Echocardiogram 5. Cardiac telemetry  6. Risk factor modification. Recommend to patient 30 minutes of light exercise such as walking, per day. Patient educated on the need to remain complaint with his  antihypertensive medications. 7. Frequent neuro checks 8. Fall precautions   Electronically signed: Dr. Caryl Pina 12/01/2020, 11:44 PM

## 2020-12-01 NOTE — ED Provider Notes (Signed)
Patient seen after prior EDP.  Patient is comfortable at time of my reevaluation.  CT abdomen pelvis results reviewed with vascular.  Vascular does not feel that IMA occlusion is acute.  Patient would likely benefit from further work-up and treatment as an inpatient.  Medicine service is aware of case and will evaluate for admission.    Wynetta Fines, MD 12/01/20 Ebony Cargo

## 2020-12-01 NOTE — ED Provider Notes (Signed)
[facility] EMERGENCY DEPARTMENT Provider Note   CSN: [id number] Arrival date & time: 12/01/20  0019     History Chief Complaint  Patient presents with   Flank Pain   Weakness    Race Alec Sanchez is a 65 y.o. male.   Flank Pain Pertinent negatives include no chest pain, no abdominal pain, no headaches and no shortness of breath.  Weakness Associated symptoms: no abdominal pain, no arthralgias, no chest pain, no cough, no dysuria, no fever, no headaches, no seizures, no shortness of breath and no vomiting    65 year old male with a history of carotid artery stenosis status post carotid endarterectomy, HLD, HTN, prior CVA, presenting to the emergency department with right-sided flank pain and concern for new CVA.  The patient initially presented to urgent care with complaint of right-sided flank pain.  He had reportedly been having flank pain for the past couple of days and denied dysuria, urgency or frequency.  Pain was sharp, moderate and non-radiating. He has been having some difficulty with ambulation.  Urinalysis was positive for hematuria and he was diagnosed with renal colic and was discharged home.  Upon arrival to home yesterday, patient was reportedly noted to have some slurring of his speech and right-sided facial droop.  This has since resolved.  He does endorse worsening generalized weakness and worsening right-sided hemibody weakness.   Patient Active Problem List   Diagnosis Date Noted   Vitamin D deficiency 04/26/2020   Thrombocytosis 04/26/2020   Prediabetes 04/26/2020   Pain in left knee 04/26/2020   Other ill-defined and unknown causes of morbidity and mortality 04/26/2020   Cocaine abuse (HCC) 04/26/2020   Alcohol abuse 04/26/2020   Nonspecific abnormal findings on radiological and examination of lung field 04/26/2020   Multiple pulmonary nodules 04/26/2020   Leukocytosis 04/26/2020   Iron deficiency 04/26/2020   Hyperlipidemia 04/26/2020    Depression 04/26/2020   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   Closed fracture of lateral malleolus 04/26/2020   Cannabis abuse 04/26/2020   Ankle pain 04/26/2020   Tobacco use disorder 04/26/2020   Pain in joint of right shoulder 04/26/2020   Low back pain 04/26/2020   Hypertension 04/26/2020   Erectile dysfunction 04/26/2020   Dental abscess 04/26/2020   CKD (chronic kidney disease) 04/26/2020   Carotid stenosis 04/12/2020   Acute ischemic stroke (HCC) 01/31/2020    Past Surgical History:  Procedure Laterality Date   ANKLE SURGERY Left    TRANSCAROTID ARTERY REVASCULARIZATION  Right 04/12/2020   Procedure: RIGHT TRANSCAROTID ARTERY REVASCULARIZATION;  Surgeon: Maeola Harman, MD;  Location: [facility] OR;  Service: Vascular;  Laterality: Right;   ULTRASOUND GUIDANCE FOR VASCULAR ACCESS Left 04/12/2020   Procedure: ULTRASOUND GUIDANCE FOR VASCULAR ACCESS, left femoral vein;  Surgeon: Maeola Harman, MD;  Location: [facility] OR;  Service: Vascular;  Laterality: Left;       No family history on file.  Social History   Tobacco Use   Smoking status: Never   Smokeless tobacco: Never  Vaping Use   Vaping Use: Never used  Substance Use Topics   Alcohol use: Not Currently   Drug use: Never    Home Medications Prior to Admission medications   Medication Sig Start Date End Date Taking? Authorizing Provider  amLODipine (NORVASC) 10 MG tablet Take 10 mg by mouth every morning.    [provider]  aspirin EC 325 MG tablet Take 325 mg by mouth daily.    [provider]  aspirin EC  81 MG EC tablet Take 1 tablet (81 mg total) by mouth daily. Swallow whole. 02/04/20   Lilland, Alana, DO  atorvastatin (LIPITOR) 80 MG tablet Take 80 mg by mouth at bedtime.    [provider]  chlorthalidone (HYGROTON) 25 MG tablet Take 25 mg by mouth daily. For high blood pressure    [provider]  Cholecalciferol 25 MCG (1000 UT) tablet Take 1,000 Units by mouth  daily.    [provider]  clopidogrel (PLAVIX) 75 MG tablet Take 75 mg by mouth daily.    [provider]  ferrous sulfate 325 (65 FE) MG tablet Take 325 mg by mouth every Monday, Wednesday, and Friday.    [provider]  lidocaine (LIDODERM) 5 % Place 1 patch onto the skin daily. Remove & Discard patch within 12 hours or as directed by MD    [provider]  lisinopril (ZESTRIL) 10 MG tablet Take 30 mg by mouth daily.    [provider]  naproxen (NAPROSYN) 500 MG tablet Take 500 mg by mouth daily as needed (back pain). Take with food and 16 oz water    [provider]  nicotine (NICODERM CQ - DOSED IN MG/24 HOURS) 21 mg/24hr patch Place 21 mg onto the skin daily.    [provider]  nicotine polacrilex (NICORETTE) 2 MG gum Take 2 mg by mouth See admin instructions. Chew one piece (2 mg) in mouth as directed for smoking cessation: Weeks 1-6 - chew one piece of gum (2 mg) every 1-2 hours (max 24 pieces/day); to increase chances of quitting, chew at least 9 pieces/day during the first 6 weeks; weeks 7-9 - chew one piece of gum (2 mg) every 2-4 hours (max 24 pieces/day); weeks 10-12 - chew one piece of gum (2 mg) every 4-8 hours (max 24 pieces/day)    [provider]  oxyCODONE-acetaminophen (PERCOCET/ROXICET) 5-325 MG tablet Take 1-2 tablets by mouth every 4 (four) hours as needed for moderate pain. 04/13/20   Lars Mage, PA-C  PARoxetine (PAXIL) 40 MG tablet Take 20 mg by mouth every morning. For mental health    [provider]  prazosin (MINIPRESS) 1 MG capsule Take 1 capsule (1 mg total) by mouth at bedtime. Patient taking differently: Take 1 mg by mouth at bedtime. For enlarged prostate and for nightmares 11/30/20   Ivette Loyal, NP  sildenafil (VIAGRA) 100 MG tablet Take 100 mg by mouth daily as needed for erectile dysfunction (do not take within 6 hours of taking prazosin).    [provider]     Allergies    Trazodone  Review of Systems   Review of Systems  Constitutional:  Negative for chills and fever.  HENT:  Negative for ear pain and sore throat.   Eyes:  Negative for pain and visual disturbance.  Respiratory:  Negative for cough and shortness of breath.   Cardiovascular:  Negative for chest pain and palpitations.  Gastrointestinal:  Negative for abdominal pain and vomiting.  Genitourinary:  Positive for flank pain. Negative for dysuria and hematuria.  Musculoskeletal:  Negative for arthralgias and back pain.  Skin:  Negative for color change and rash.  Neurological:  Positive for facial asymmetry, speech difficulty and weakness. Negative for seizures, syncope and headaches.  All other systems reviewed and are negative.  Physical Exam Updated Vital Signs BP (!) 202/100   Pulse 75   Temp 98.5 F (36.9 C) (Oral)   Resp 19   SpO2  98%   Physical Exam Vitals and nursing note reviewed.  Constitutional:      Appearance: He is well-developed.  HENT:     Head: Normocephalic and atraumatic.  Eyes:     Conjunctiva/sclera: Conjunctivae normal.  Cardiovascular:     Rate and Rhythm: Normal rate and regular rhythm.     Pulses: Normal pulses.     Comments: 2+ equal and symmetric radial pulses Pulmonary:     Effort: Pulmonary effort is normal. No respiratory distress.     Breath sounds: Normal breath sounds.  Abdominal:     Palpations: Abdomen is soft.     Tenderness: There is no abdominal tenderness.  Musculoskeletal:        General: No swelling or tenderness.     Cervical back: Neck supple.  Skin:    General: Skin is warm and dry.  Neurological:     Mental Status: He is alert.     Comments: MENTAL STATUS EXAM:    Orientation: Alert and oriented to person, place and time. Memory: Cooperative, follows commands well.  Language: Speech is clear and language is normal.   CRANIAL NERVES:    CN 2 (Optic): Visual fields intact to confrontation.  CN 3,4,6 (EOM):  Pupils equal and reactive to light. Full extraocular eye movement without nystagmus.  CN 5 (Trigeminal): Facial sensation is normal, no weakness of masticatory muscles.  CN 7 (Facial): No facial weakness or asymmetry.  CN 8 (Auditory): Auditory acuity grossly normal.  CN 9,10 (Glossophar): The uvula is midline, the palate elevates symmetrically.  CN 11 (spinal access): Normal sternocleidomastoid and trapezius strength.  CN 12 (Hypoglossal): The tongue is midline. No atrophy or fasciculations.Marland Kitchen   MOTOR:  Muscle Strength: 4/5RUE, 5/5LUE, 4/5RLE, 5/5LLE.   COORDINATION:   Intact finger-to-nose, no tremor, no pronator drift.   SENSATION:   Intact to light touch all four extremities.  GAIT: Gait not assessed     ED Results / Procedures / Treatments   Labs (all labs ordered are listed, but only abnormal results are displayed) Labs Reviewed  CBC - Abnormal; Notable for the following components:      Result Value   WBC 13.8 (*)    All other components within normal limits  DIFFERENTIAL - Abnormal; Notable for the following components:   Neutro Abs 9.0 (*)    Monocytes Absolute 1.2 (*)    All other components within normal limits  COMPREHENSIVE METABOLIC PANEL - Abnormal; Notable for the following components:   Potassium 3.4 (*)    Glucose, Bld 107 (*)    BUN 24 (*)    Creatinine, Ser 1.63 (*)    Total Protein 8.7 (*)    GFR, Estimated 46 (*)    All other components within normal limits  RAPID URINE DRUG SCREEN, HOSP PERFORMED - Abnormal; Notable for the following components:   Tetrahydrocannabinol POSITIVE (*)    All other components within normal limits  URINALYSIS, ROUTINE W REFLEX MICROSCOPIC - Abnormal; Notable for the following components:   Specific Gravity, Urine >1.046 (*)    Hgb urine dipstick MODERATE (*)    Ketones, ur 80 (*)    Protein, ur 30 (*)    All other components within normal limits  D-DIMER, QUANTITATIVE - Abnormal; Notable for the following components:    D-Dimer, Quant 1.28 (*)    All other components within normal limits  SEDIMENTATION RATE - Abnormal; Notable for the following components:   Sed Rate 27 (*)    All other components  within normal limits  C-REACTIVE PROTEIN - Abnormal; Notable for the following components:   CRP 2.5 (*)    All other components within normal limits  I-STAT CHEM 8, ED - Abnormal; Notable for the following components:   BUN 29 (*)    Creatinine, Ser 1.60 (*)    Glucose, Bld 104 (*)    Calcium, Ion 1.13 (*)    All other components within normal limits  TROPONIN I (HIGH SENSITIVITY) - Abnormal; Notable for the following components:   Troponin I (High Sensitivity) 47 (*)    All other components within normal limits  TROPONIN I (HIGH SENSITIVITY) - Abnormal; Notable for the following components:   Troponin I (High Sensitivity) 44 (*)    All other components within normal limits  RESP PANEL BY RT-PCR (FLU A&B, COVID) ARPGX2  PROTIME-INR  APTT  LACTIC ACID, PLASMA  CBG MONITORING, ED    EKG EKG Interpretation  Date/Time:  Friday December 01 2020 15:05:38 EDT Ventricular Rate:  79 PR Interval:  124 QRS Duration: 92 QT Interval:  423 QTC Calculation: 485 R Axis:   22 Text Interpretation: Sinus rhythm Borderline T abnormalities, inferior leads Borderline prolonged QT interval Confirmed by Ernie Avena ([id number]) on 12/01/2020 3:12:38 PM  Radiology CT ANGIO HEAD NECK W WO CM  Result Date: 12/01/2020 CLINICAL DATA:  Neuro deficit, acute, stroke suspected. Slurred speech. EXAM: CT ANGIOGRAPHY HEAD AND NECK TECHNIQUE: Multidetector CT imaging of the head and neck was performed using the standard protocol during bolus administration of intravenous contrast. Multiplanar CT image reconstructions and MIPs were obtained to evaluate the vascular anatomy. Carotid stenosis measurements (when applicable) are obtained utilizing NASCET criteria, using the distal internal carotid diameter as the denominator. CONTRAST:   OMNIPAQUE IOHEXOL 350 MG/ML SOLN COMPARISON:  Head and neck CTA 01/31/2020 FINDINGS: CTA NECK FINDINGS Aortic arch: Standard 3 vessel aortic arch with mixed calcified and soft plaque and with detailed evaluation of the aorta performed on separate chest CTA. Patent brachiocephalic and subclavian arteries without significant stenosis. Right carotid system: Patent without evidence of a significant stenosis or dissection. Interval carotid stenting extending from the distal common into the proximal internal carotid arteries. Left carotid system: Patent with soft plaque in the mid common carotid artery not resulting in significant stenosis. Mixed calcified and soft, mildly ulcerated plaque in the left carotid bulb results in approximately 60% stenosis, unchanged. Vertebral arteries: The left vertebral artery is patent and dominant with predominantly soft plaque in the proximal V1 segment resulting in progressive, severe stenosis (series 10, image 106). There is unchanged occlusion of the right vertebral artery at its origin with reconstitution of small distal V2 and V3 segments. A severe stenosis is again noted of the distal right V3 segment. Skeleton: Focally advanced cervical disc degeneration at C5-6. Moderate left facet arthrosis at C4-5. Other neck: No evidence of cervical lymphadenopathy or mass. Upper chest: Reported separately. Review of the MIP images confirms the above findings CTA HEAD FINDINGS Anterior circulation: The internal carotid arteries are patent from skull base to carotid termini with similar appearance of mild left cavernous and mild to moderate bilateral paraclinoid stenoses. ACAs and MCAs are patent without evidence of a proximal branch occlusion or significant proximal stenosis. There is an unchanged mild left M1 stenosis. Mild-to-moderate branch vessel irregularity is noted bilaterally. No aneurysm is identified. Posterior circulation: The intracranial vertebral arteries are patent to the  basilar. The left V4 segment is mildly irregular without significant stenosis. The right V4 segment is  small with superimposed moderate to severe stenoses. Patent PICA, AICA, and SCA origins are identified bilaterally. The basilar artery is widely patent. Posterior communicating arteries are diminutive or absent. The PCAs are patent with moderate branch vessel irregularity but no flow limiting proximal stenosis. No aneurysm is identified. Venous sinuses: Not well evaluated due to arterial contrast timing. Anatomic variants: Dominant left vertebral artery. Review of the MIP images confirms the above findings IMPRESSION: 1. No emergent large vessel occlusion. 2. Interval right carotid stenting without residual stenosis. 3. Unchanged 60% proximal left ICA stenosis. 4. 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 5. Progressive, severe proximal left vertebral artery stenosis. 6. Unchanged mild to moderate bilateral intracranial ICA stenoses and mild left M1 stenosis. 7. Aortic Atherosclerosis (ICD10-I70.0). Electronically Signed   By: Sebastian Ache M.D.   On: 12/01/2020 16:32   CT HEAD WO CONTRAST  Result Date: 12/01/2020 CLINICAL DATA:  Neuro deficit, generalized weakness EXAM: CT HEAD WITHOUT CONTRAST TECHNIQUE: Contiguous axial images were obtained from the base of the skull through the vertex without intravenous contrast. COMPARISON:  03/10/2020 FINDINGS: Brain: No evidence of acute infarction, hemorrhage, hydrocephalus, extra-axial collection or mass lesion/mass effect. Chronic atrophic and ischemic changes are noted stable in appearance from the prior exam. Vascular: No hyperdense vessel or unexpected calcification. Skull: Normal. Negative for fracture or focal lesion. Sinuses/Orbits: No acute finding. Other: None. IMPRESSION: Chronic atrophic and ischemic changes stable from the prior exam. Electronically Signed   By: Alcide Clever M.D.   On: 12/01/2020 01:46    CT Angio Chest/Abd/Pel for Dissection W and/or Wo Contrast  Result Date: 12/01/2020 CLINICAL DATA:  Right flank pain, abdominal pain, slurred speech EXAM: CT ANGIOGRAPHY CHEST, ABDOMEN AND PELVIS TECHNIQUE: Non-contrast CT of the chest was initially obtained. Multidetector CT imaging through the chest, abdomen and pelvis was performed using the standard protocol during bolus administration of intravenous contrast. Multiplanar reconstructed images and MIPs were obtained and reviewed to evaluate the vascular anatomy. CONTRAST:  OMNIPAQUE IOHEXOL 350 MG/ML SOLN COMPARISON:  None. FINDINGS: CTA CHEST FINDINGS Cardiovascular: The heart and great vessels are grossly unremarkable without pericardial effusion. Mild atherosclerosis of the coronary vasculature. There is no evidence of thoracic aortic aneurysm or dissection. Moderate atherosclerosis of the aortic arch is noted, with mild narrowing at the origin of the innominate and left common carotid arteries. No filling defects or pulmonary emboli. Mediastinum/Nodes: No enlarged mediastinal, hilar, or axillary lymph nodes. Thyroid gland, trachea, and esophagus demonstrate no significant findings. Lungs/Pleura: No acute airspace disease, effusion, or pneumothorax. The central airways are patent. Musculoskeletal: No acute or destructive bony lesions. Cortical and trabecular thickening of the left first rib may reflect underlying Paget disease. Reconstructed images demonstrate no additional findings. Review of the MIP images confirms the above findings. CTA ABDOMEN AND PELVIS FINDINGS VASCULAR Aorta: Normal caliber aorta without aneurysm, dissection, or significant stenosis. There is moderate multifocal atherosclerosis. At the level of the IMA, there is mild fat stranding along the ventral aspect of the aorta extending to the root of the IMA. This could reflect acute IMA thrombosis or underlying vasculitis. Celiac: Patent without evidence of aneurysm, dissection,  vasculitis or significant stenosis. SMA: Patent without evidence of aneurysm, dissection, vasculitis or significant stenosis. Mild atherosclerosis. Renals: There are single bilateral renal arteries. On the left, there is mild to moderate atherosclerosis with approximately 50% stenosis in the distal left renal artery at the hilum. On the right, there is diffuse atheromatous plaque extending  from the origin for approximately 2.5 cm in length, resulting in multifocal high-grade stenoses estimated 50-70%. No evidence of dissection aneurysm, or vasculitis. IMA: The IMA is occluded at its origin, with perivascular fat stranding suggesting possible acute IMA occlusion or vasculitis. No evidence of aneurysm or dissection. Distal branches of the IMA enhance normally via collateral flow. Inflow: Patent without evidence of aneurysm, dissection, vasculitis or significant stenosis. Mild to moderate atherosclerosis within the right common iliac artery without high-grade stenosis. Veins: No obvious venous abnormality within the limitations of this arterial phase study. Review of the MIP images confirms the above findings. NON-VASCULAR Hepatobiliary: No focal liver abnormality is seen. No gallstones, gallbladder wall thickening, or biliary dilatation. Pancreas: Unremarkable. No pancreatic ductal dilatation or surrounding inflammatory changes. Spleen: Normal in size without focal abnormality. Adrenals/Urinary Tract: Mild right renal cortical atrophy may reflect sequela of underlying atherosclerosis in the right renal artery described above. Left kidney is unremarkable. No evidence of nephrolithiasis or obstructive uropathy within either kidney. The adrenals and bladder are grossly unremarkable. Minimal excreted contrast within the left ureter and bladder. Stomach/Bowel: No bowel obstruction or ileus. Normal appendix right lower quadrant. No bowel wall thickening or inflammatory change. Specifically, no evidence of distal colonic  wall thickening to suggest underlying ischemia. Lymphatic: No pathologic adenopathy within the abdomen or pelvis. Reproductive: Prostate is unremarkable. Other: No free fluid or free gas.  No abdominal wall hernia. Musculoskeletal: No acute or destructive bony lesions. Reconstructed images demonstrate no additional findings. Review of the MIP images confirms the above findings. IMPRESSION: 1. 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 Findings could reflect acute IMA occlusion or underlying vasculitis. The distal branches of the IMA enhance via collateral flow, with no evidence of bowel ischemia within the distal colon. 2. No evidence of thoracoabdominal aortic aneurysm or dissection. 3. Multifocal atherosclerosis with significant stenoses at the origin of the innominate artery, left common carotid artery, and right renal artery as above. 4. No evidence of pulmonary embolus. 5. Otherwise no acute intrathoracic, intra-abdominal, or intrapelvic process. 6. 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 7.  Aortic Atherosclerosis (ICD10-I70.0). Critical Value/emergent results were called by telephone at the time of interpretation on 12/01/2020 at 4:41 pm to provider [facility], who verbally acknowledged these results. Electronically Signed   By: Sharlet Salina M.D.   On: 12/01/2020 16:45    Procedures Procedures   Medications Ordered in ED Medications  sodium chloride flush (NS) 0.9 % injection 3 mL (3 mLs Intravenous Given 12/01/20 1539)  iohexol (OMNIPAQUE) 350 MG/ML injection 100 mL (100 mLs Intravenous Contrast Given 12/01/20 1608)  0.9 %  sodium chloride infusion ( Intravenous New Bag/Given 12/01/20 1844)    ED Course  I have reviewed the triage vital signs and the nursing notes.  Pertinent labs & imaging results that were available during my care of the patient were reviewed by me and considered  in my medical decision making (see chart for details).    MDM Rules/Calculators/A&P                           65 year old male with a history of carotid artery stenosis status post carotid endarterectomy, HLD, HTN, prior CVA, presenting to the emergency department with right-sided flank pain and concern for new CVA.    On arrival, the patient endorsed persistent abdominal pain/right-sided flank pain,  described as sharp and shooting, new right-sided deficits, some of which have apparently resolved.  Family had reported right-sided facial droop and slurring of speech which has reportedly resolved.  Given the patient's history of carotid stenosis and endarterectomy, differential diagnosis includes TIA, new CVA, carotid stenosis, carotid or vertebral dissection, aortic dissection, small bowel obstruction, bowel ischemia, nephrolithiasis.   We will evaluate further with CT of the chest abdomen pelvis to evaluate the patient's aorta, chest and abdomen and CTA head and neck to evaluate for potential CVA versus dissection vs stenosis.  EKG was performed which revealed no STEMI, nonspecific T wave abnormalities, borderline long QT.   A d-dimer was collected and found to be elevated at 1.28, and initial troponin was elevated at 44.  Lactic acid elevated at 1.1. COVID and influenza testing collected. CTA imaging pending at time of sign-out. Plan at sign-out to follow-up CT imaging and reassess. Sign-out given to Dr. Rodena Medin at [phone].   Final Clinical Impression(s) / ED Diagnoses Final diagnoses:  Weakness  Dehydration  Low back pain, unspecified back pain laterality, unspecified chronicity, unspecified whether sciatica present    Rx / DC Orders ED Discharge Orders     None        Ernie Avena, MD 12/01/20 1947

## 2020-12-01 NOTE — ED Triage Notes (Signed)
Pt from home with EMS after family called for concerns of a stroke. Pt has a hx of a stroke. At noon today, pt drove himself to urgent care for R sided flank pain and diagnosed with a kidney stone. Family attempted to contact pt but he didn't answer, stating to EMS that he didn't want to talk to anyone and was just tired. Pt A&Ox3, appears generally weak. Reports not being able to sleep due to flank pain for a couple of days. Pt has generalized weakness in all extremities, feels that his speech is slurred. Pt noted to be hypertensive, has not taken any medications today

## 2020-12-01 NOTE — ED Notes (Signed)
Lab at BS 

## 2020-12-01 NOTE — ED Notes (Signed)
To CT, swallow screen passed, labs sent, IV flushed.

## 2020-12-01 NOTE — H&P (Addendum)
Date: 12/02/2020               Patient Name:  Alec Sanchez MRN: [id number]  DOB: 07-21-1955 Age / Sex: 65 y.o., male   PCP: [facility]         Medical Service: Internal Medicine Teaching Service         Attending Physician: Dr. Mikey Bussing, Marthenia Rolling, DO    First Contact: [name], MD Pager: [place] [phone]  Second Contact: Ross Marcus, MD Pager: Eustaquio Maize [phone]       After Hours (After 5p/  First Contact Pager: [phone]  weekends / holidays): Second Contact Pager: [phone]   SUBJECTIVE   Chief Complaint: Weakness falls  History of Present Illness:   Patient is a 65 year old male with a history of hypertension hyperlipidemia prior stroke with residual right-sided deficits who presented with complaints of weakness and falls.   Family reports that he had stroke 2 years ago and second in December with residual deficits however he has been independent prior to Tuesday.  Beginning Tuesday, daughter noticed drooping of face, slurred speech worse than his baseline, AMS, loss of bowel and bladder continence, slowing of movement and speech. Difficulty holding things in right hand. Last seen normal on Tuesday.  Patient has not been able to uphold social engagements recently. This is a sudden change from his baseline as he has been able to take care of himself previously.  Patient had been managing his own medications, but he reports that he has intermittently been taking medications.  Denies any headache nausea vomiting diarrhea no chest pain or abdominal pain.  He does endorse difficulty with holding in his urine but no bowel incontinence.  Patient reports that he has fallen twice since Tuesday Tuesday and Wednesday and injured his back during this fall.  Patient presented to the urgent care earlier 11/30/2020 for low back pain and was referred to [facility] emergency department for further evaluation.  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  Vascular surgery was consulted.  Labs showed AKI and internal medicine was consulted for admission.  Meds:  Current Meds  Medication Sig   amLODipine (NORVASC) 10 MG tablet Take 10 mg by mouth every morning.   aspirin EC 325 MG tablet Take 325 mg by mouth daily.   atorvastatin (LIPITOR) 80 MG tablet Take 80 mg by mouth at bedtime.   chlorthalidone (HYGROTON) 25 MG tablet Take 25 mg by mouth daily. For high blood pressure   Cholecalciferol 25 MCG (1000 UT) tablet Take 1,000 Units by mouth every morning.   clopidogrel (PLAVIX) 75 MG tablet Take 75 mg by mouth daily.   ferrous sulfate 325 (65 FE) MG tablet Take 325 mg by mouth every Monday, Wednesday, and Friday.   lidocaine (LIDODERM) 5 % Place 1 patch onto the skin daily. Remove & Discard patch within 12 hours or as directed by MD   lisinopril (ZESTRIL) 10 MG tablet Take 30 mg by mouth at bedtime.   naproxen (NAPROSYN) 500 MG tablet Take 500 mg by mouth daily as needed (back pain). Take with food and 16 oz water   PARoxetine (PAXIL) 40 MG tablet Take 20 mg by mouth every morning. For mental health   prazosin (MINIPRESS) 1 MG capsule Take 1 capsule (1 mg total) by mouth at bedtime. (Patient taking differently: Take 1 mg by mouth at bedtime. For enlarged prostate and for nightmares)   sildenafil (VIAGRA) 100  MG tablet Take 100 mg by mouth daily as needed for erectile dysfunction (do not take within 6 hours of taking prazosin).    Past Medical History:  Diagnosis Date   Anxiety    Carotid artery occlusion    Depression    Hyperlipidemia    Hypertension    Stroke [facility])     Past Surgical History:  Procedure Laterality Date   ANKLE SURGERY Left    TRANSCAROTID ARTERY REVASCULARIZATION  Right 04/12/2020   Procedure: RIGHT TRANSCAROTID ARTERY REVASCULARIZATION;  Surgeon: Maeola Harman, MD;  Location: [facility] OR;  Service: Vascular;  Laterality: Right;   ULTRASOUND GUIDANCE FOR VASCULAR ACCESS Left 04/12/2020    Procedure: ULTRASOUND GUIDANCE FOR VASCULAR ACCESS, left femoral vein;  Surgeon: Maeola Harman, MD;  Location: [facility] OR;  Service: Vascular;  Laterality: Left;    Social:  Lives With: Self Occupation: Retired Support: Daughter Level of Function: Previously independent PCP: Substances: None currently  Family History: None  Allergies: Allergies as of 12/01/2020 - Review Complete 12/01/2020  Allergen Reaction Noted   Trazodone Nausea And Vomiting 08/13/2019    Review of Systems: A complete ROS was negative except as per HPI.   OBJECTIVE:   Physical Exam: Blood pressure (!) 202/100, pulse 75, temperature 98.5 F (36.9 C), temperature source Oral, resp. rate 19, SpO2 98 %.  Constitutional: Alert and oriented in no acute distress HENT: Dry mucous membranes Eyes: conjunctiva non-erythematous Neck: supple no signs of JVD Cardiovascular: regular rate and rhythm, no m/r/g distal pulses intact Pulmonary/Chest: normal work of breathing on room air, lungs clear to auscultation bilaterally Abdominal: soft, non-tender, non-distended MSK: No midline tenderness, tenderness right lower back Neurological: alert & oriented x 3, cranial nerves intact speech slow, 4/5 strength in bilateral upper and lower extremities, no saddle anesthesia, rectal tone intact Skin: warm and dry Psych: Mood and affect appropriate  Labs: CBC    Component Value Date/Time   WBC 13.8 (H) 12/01/2020 0041   RBC 5.41 12/01/2020 0041   HGB 17.0 12/01/2020 0134   HCT 50.0 12/01/2020 0134   PLT 354 12/01/2020 0041   MCV 91.5 12/01/2020 0041   MCH 30.5 12/01/2020 0041   MCHC 33.3 12/01/2020 0041   RDW 14.6 12/01/2020 0041   LYMPHSABS 3.4 12/01/2020 0041   MONOABS 1.2 (H) 12/01/2020 0041   EOSABS 0.0 12/01/2020 0041   BASOSABS 0.0 12/01/2020 0041     CMP     Component Value Date/Time   NA 140 12/01/2020 0134   K 3.6 12/01/2020 0134   CL 103 12/01/2020 0134   CO2 24 12/01/2020 0041   GLUCOSE  104 (H) 12/01/2020 0134   BUN 29 (H) 12/01/2020 0134   CREATININE 1.60 (H) 12/01/2020 0134   CALCIUM 9.7 12/01/2020 0041   PROT 8.7 (H) 12/01/2020 0041   ALBUMIN 4.5 12/01/2020 0041   AST 24 12/01/2020 0041   ALT 16 12/01/2020 0041   ALKPHOS 81 12/01/2020 0041   BILITOT 1.2 12/01/2020 0041   GFRNONAA 46 (L) 12/01/2020 0041    Imaging: CT ANGIO HEAD NECK W WO CM  Result Date: 12/01/2020 CLINICAL DATA:  Neuro deficit, acute, stroke suspected. Slurred speech. EXAM: CT ANGIOGRAPHY HEAD AND NECK TECHNIQUE: Multidetector CT imaging of the head and neck was performed using the standard protocol during bolus administration of intravenous contrast. Multiplanar CT image reconstructions and MIPs were obtained to evaluate the vascular anatomy. Carotid stenosis measurements (when applicable) are obtained utilizing NASCET criteria, using the distal internal carotid diameter as  the denominator. CONTRAST:  OMNIPAQUE IOHEXOL 350 MG/ML SOLN COMPARISON:  Head and neck CTA 01/31/2020 FINDINGS: CTA NECK FINDINGS Aortic arch: Standard 3 vessel aortic arch with mixed calcified and soft plaque and with detailed evaluation of the aorta performed on separate chest CTA. Patent brachiocephalic and subclavian arteries without significant stenosis. Right carotid system: Patent without evidence of a significant stenosis or dissection. Interval carotid stenting extending from the distal common into the proximal internal carotid arteries. Left carotid system: Patent with soft plaque in the mid common carotid artery not resulting in significant stenosis. Mixed calcified and soft, mildly ulcerated plaque in the left carotid bulb results in approximately 60% stenosis, unchanged. Vertebral arteries: The left vertebral artery is patent and dominant with predominantly soft plaque in the proximal V1 segment resulting in progressive, severe stenosis (series 10, image 106). There is unchanged occlusion of the right vertebral artery at  its origin with reconstitution of small distal V2 and V3 segments. A severe stenosis is again noted of the distal right V3 segment. Skeleton: Focally advanced cervical disc degeneration at C5-6. Moderate left facet arthrosis at C4-5. Other neck: No evidence of cervical lymphadenopathy or mass. Upper chest: Reported separately. Review of the MIP images confirms the above findings CTA HEAD FINDINGS Anterior circulation: The internal carotid arteries are patent from skull base to carotid termini with similar appearance of mild left cavernous and mild to moderate bilateral paraclinoid stenoses. ACAs and MCAs are patent without evidence of a proximal branch occlusion or significant proximal stenosis. There is an unchanged mild left M1 stenosis. Mild-to-moderate branch vessel irregularity is noted bilaterally. No aneurysm is identified. Posterior circulation: The intracranial vertebral arteries are patent to the basilar. The left V4 segment is mildly irregular without significant stenosis. The right V4 segment is small with superimposed moderate to severe stenoses. Patent PICA, AICA, and SCA origins are identified bilaterally. The basilar artery is widely patent. Posterior communicating arteries are diminutive or absent. The PCAs are patent with moderate branch vessel irregularity but no flow limiting proximal stenosis. No aneurysm is identified. Venous sinuses: Not well evaluated due to arterial contrast timing. Anatomic variants: Dominant left vertebral artery. Review of the MIP images confirms the above findings IMPRESSION: 1. No emergent large vessel occlusion. 2. Interval right carotid stenting without residual stenosis. 3. Unchanged 60% proximal left ICA stenosis. 4. 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 5. Progressive, severe proximal left vertebral artery stenosis. 6. Unchanged mild to moderate bilateral intracranial ICA stenoses and mild left M1 stenosis. 7.  Aortic Atherosclerosis (ICD10-I70.0). Electronically Signed   By: Sebastian Ache M.D.   On: 12/01/2020 16:32   MR BRAIN WO CONTRAST  Result Date: 12/01/2020 CLINICAL DATA:  Follow-up examination for acute stroke. EXAM: MRI HEAD WITHOUT CONTRAST TECHNIQUE: Multiplanar, multiecho pulse sequences of the brain and surrounding structures were obtained without intravenous contrast. COMPARISON:  Prior CTA from earlier the same day. FINDINGS: Brain: Diffuse prominence of the CSF containing spaces compatible with generalized cerebral atrophy. Patchy and confluent T2/FLAIR hyperintensity involving the periventricular and deep white matter both cerebral hemispheres most consistent with chronic small vessel ischemic disease, moderate to advanced in nature. Remote lacunar infarcts present at the left greater than right basal ganglia and thalami. Appearance is progressed from prior. Three distinct foci of restricted diffusion all measuring approximately 8 mm each seen involving the right anterior genu of the corpus callosum (series 5, image 87), periventricular white matter of the right posterior corona radiata (  series 5, image 84), and right lentiform nucleus (series 5, image 78). No associated hemorrhage or mass effect. No other diffusion abnormality to suggest acute or subacute ischemia. Additionally, there is patchy T2/FLAIR signal abnormality involving primarily the subcortical aspect of the posterior right frontoparietal region (series 11, image 20), with extension to involve the parasagittal right frontal region near the vertex (series 11, image 22). No associated diffusion abnormality or significant mass effect. Finding is nonspecific, but new as compared to prior MRI, and could reflect changes subacute ischemia, somewhat watershed in distribution, possibly related to previously identified severe right ICA stenosis. Possible PRES could also conceivably have this appearance. No evidence for acute intracranial hemorrhage.  Multiple scattered punctate chronic micro hemorrhages noted, primarily clustered about the deep gray nuclei, likely related to chronic poorly controlled hypertension. No mass lesion, midline shift or mass effect. Mild ventricular prominence related global parenchymal volume loss of hydrocephalus. No extra-axial fluid collection. Pituitary gland suprasellar region normal. Midline structures intact. Vascular: Major intracranial vascular flow voids are maintained. Skull and upper cervical spine: Craniocervical junction within normal limits. Bone marrow signal intensity within normal limits. No scalp soft tissue abnormality. Sinuses/Orbits: Globes orbital soft tissues within normal limits. Paranasal sinuses are largely clear. No significant mastoid effusion. Inner ear structures grossly normal. Other: None. IMPRESSION: 1. Three distinct 8 mm acute ischemic infarcts involving the right anterior genu of the corpus callosum, periventricular white matter of the right posterior corona radiata, and right lentiform nucleus as above. No associated hemorrhage or mass effect. 2. Scattered T2/FLAIR signal abnormality involving the subcortical aspects of the right frontal and parietal regions as above, new as compared to prior MRI from 01/31/2020. Finding is nonspecific, but favored to reflect changes of subacute ischemia, possibly related to previously identified severe right ICA stenosis given the somewhat watershed distribution. Possible PRES could also conceivably have this appearance, and could be considered in the correct clinical setting. 3. Underlying age-related cerebral atrophy with advanced chronic microvascular ischemic disease, progressed from prior. Electronically Signed   By: Rise Mu M.D.   On: 12/01/2020 22:44   CT Angio Chest/Abd/Pel for Dissection W and/or Wo Contrast  Result Date: 12/01/2020 CLINICAL DATA:  Right flank pain, abdominal pain, slurred speech EXAM: CT ANGIOGRAPHY CHEST, ABDOMEN AND  PELVIS TECHNIQUE: Non-contrast CT of the chest was initially obtained. Multidetector CT imaging through the chest, abdomen and pelvis was performed using the standard protocol during bolus administration of intravenous contrast. Multiplanar reconstructed images and MIPs were obtained and reviewed to evaluate the vascular anatomy. CONTRAST:  OMNIPAQUE IOHEXOL 350 MG/ML SOLN COMPARISON:  None. FINDINGS: CTA CHEST FINDINGS Cardiovascular: The heart and great vessels are grossly unremarkable without pericardial effusion. Mild atherosclerosis of the coronary vasculature. There is no evidence of thoracic aortic aneurysm or dissection. Moderate atherosclerosis of the aortic arch is noted, with mild narrowing at the origin of the innominate and left common carotid arteries. No filling defects or pulmonary emboli. Mediastinum/Nodes: No enlarged mediastinal, hilar, or axillary lymph nodes. Thyroid gland, trachea, and esophagus demonstrate no significant findings. Lungs/Pleura: No acute airspace disease, effusion, or pneumothorax. The central airways are patent. Musculoskeletal: No acute or destructive bony lesions. Cortical and trabecular thickening of the left first rib may reflect underlying Paget disease. Reconstructed images demonstrate no additional findings. Review of the MIP images confirms the above findings. CTA ABDOMEN AND PELVIS FINDINGS VASCULAR Aorta: Normal caliber aorta without aneurysm, dissection, or significant stenosis. There is moderate multifocal atherosclerosis. At the level of the IMA,  there is mild fat stranding along the ventral aspect of the aorta extending to the root of the IMA. This could reflect acute IMA thrombosis or underlying vasculitis. Celiac: Patent without evidence of aneurysm, dissection, vasculitis or significant stenosis. SMA: Patent without evidence of aneurysm, dissection, vasculitis or significant stenosis. Mild atherosclerosis. Renals: There are single bilateral renal  arteries. On the left, there is mild to moderate atherosclerosis with approximately 50% stenosis in the distal left renal artery at the hilum. On the right, there is diffuse atheromatous plaque extending from the origin for approximately 2.5 cm in length, resulting in multifocal high-grade stenoses estimated 50-70%. No evidence of dissection aneurysm, or vasculitis. IMA: The IMA is occluded at its origin, with perivascular fat stranding suggesting possible acute IMA occlusion or vasculitis. No evidence of aneurysm or dissection. Distal branches of the IMA enhance normally via collateral flow. Inflow: Patent without evidence of aneurysm, dissection, vasculitis or significant stenosis. Mild to moderate atherosclerosis within the right common iliac artery without high-grade stenosis. Veins: No obvious venous abnormality within the limitations of this arterial phase study. Review of the MIP images confirms the above findings. NON-VASCULAR Hepatobiliary: No focal liver abnormality is seen. No gallstones, gallbladder wall thickening, or biliary dilatation. Pancreas: Unremarkable. No pancreatic ductal dilatation or surrounding inflammatory changes. Spleen: Normal in size without focal abnormality. Adrenals/Urinary Tract: Mild right renal cortical atrophy may reflect sequela of underlying atherosclerosis in the right renal artery described above. Left kidney is unremarkable. No evidence of nephrolithiasis or obstructive uropathy within either kidney. The adrenals and bladder are grossly unremarkable. Minimal excreted contrast within the left ureter and bladder. Stomach/Bowel: No bowel obstruction or ileus. Normal appendix right lower quadrant. No bowel wall thickening or inflammatory change. Specifically, no evidence of distal colonic wall thickening to suggest underlying ischemia. Lymphatic: No pathologic adenopathy within the abdomen or pelvis. Reproductive: Prostate is unremarkable. Other: No free fluid or free gas.  No  abdominal wall hernia. Musculoskeletal: No acute or destructive bony lesions. Reconstructed images demonstrate no additional findings. Review of the MIP images confirms the above findings. IMPRESSION: 1. 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 Findings could reflect acute IMA occlusion or underlying vasculitis. The distal branches of the IMA enhance via collateral flow, with no evidence of bowel ischemia within the distal colon. 2. No evidence of thoracoabdominal aortic aneurysm or dissection. 3. Multifocal atherosclerosis with significant stenoses at the origin of the innominate artery, left common carotid artery, and right renal artery as above. 4. No evidence of pulmonary embolus. 5. Otherwise no acute intrathoracic, intra-abdominal, or intrapelvic process. 6. 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 7.  Aortic Atherosclerosis (ICD10-I70.0). Critical Value/emergent results were called by telephone at the time of interpretation on 12/01/2020 at 4:41 pm to provider [facility], who verbally acknowledged these results. Electronically Signed   By: Sharlet Salina M.D.   On: 12/01/2020 16:45    EKG: personally reviewed my interpretation is sinus rhythm   ASSESSMENT & PLAN:    Assessment & Plan by Problem: Principal Problem:   Acute ischemic stroke (HCC) Active Problems:   AKI (acute kidney injury) (HCC)   Saman Parekh is a 65 y.o. with pertinent PMH of hypertension hyperlipidemia prior stroke who presented with falls back pain and admitted for acute kidney injury and acute cerebral infarction on hospital day 1  Acute cerebral infarction. History of cerebral infarctions with most recent  in December 2021 with residual right-sided weakness CT head negative.  MRI of the brain showing 3 distinct 8 mm acute ischemic infarcts involving the right anterior genu of the corpus callosum,  periventricular white matter of the right posterior corona radiata, and the right 1 to form nucleus.  No hemorrhage or mass-effect.  Neurology has been consulted.  Given history of onset of symptoms patient is outside the window for permissive hypertension. Home blood pressure medications have been restarted. - Continue aspirin statin and Plavix - Follow-up neurology recommendations. - Given uncontrolled hypertension in the 200s systolic and pattern of strokes likely hypertensive in etiology, however can consider echocardiogram and carotid Dopplers pending neurology recommendations. - PT, OT, SLP have been consulted we will follow-up their recommendations. - Fall precautions  #aki likely multifactorial Urine gravity hihgh indicating dehydration, history of RAS, uncontrolled htn 200 systolic, lisinopril, naproxen No evidence of pyelo or hydronephrosis on imaging Resume Amlodipine and check bmp in am, resume lisinopril and chlorthalidone after cr trends normal. Fluids. Gentle fluids suspect hfpef ekg LVH  #HTN Patient has not been compliant with medications likely contributing to uncontrolled blood pressure. Home blood pressure medications have not yet been restarted, he is on cardiac monitoring.  Continue to monitor.  Back pain Patient reporting back pain beginning after falling several times.  On physical exam he has some muscle tension no midline tenderness.  Ruled out cauda equina with normal rectal tone on physical exam straight leg raise negative no concern for sciatica.  CT did not show any fractures.  Likely musculoskeletal Given lidocaine patch and Robaxin for pain.   #ima vasculitis? Mildly elevated sed rate and crp with slight wbc.  No concern for acute change as there is good collateral flow noted on CT. Vascular surgery was consulted in the ED made aware.  HLD - recheck lipid and resume home meds  DM Recheck a1c   Diet: NPO VTE: Heparin IVF: LR,100cc/hr Code:  Full  Prior to Admission Living Arrangement: Home, living self Anticipated Discharge Location: SNF Barriers to Discharge: Pending medical  Dispo: Admit patient to Inpatient with expected length of stay greater than 2 midnights.  Signed: Adron Bene, MD Internal Medicine Resident PGY-1 Pager: [phone]  12/02/2020, 1:44 AM

## 2020-12-01 NOTE — ED Notes (Signed)
Re-updated on NPO status with rationale

## 2020-12-02 DIAGNOSIS — I639 Cerebral infarction, unspecified: Secondary | ICD-10-CM

## 2020-12-02 LAB — LIPID PANEL
Cholesterol: 197 mg/dL (ref 0–200)
HDL: 32 mg/dL — ABNORMAL LOW (ref >40)
LDL Cholesterol: 144 mg/dL — ABNORMAL HIGH (ref 0–99)
Total CHOL/HDL Ratio: 6.2 RATIO
Triglycerides: 104 mg/dL (ref <150)
VLDL: 21 mg/dL (ref 0–40)

## 2020-12-02 LAB — BASIC METABOLIC PANEL
Anion gap: 14 (ref 5–15)
BUN: 21 mg/dL (ref 8–23)
CO2: 22 mmol/L (ref 22–32)
Calcium: 9.2 mg/dL (ref 8.9–10.3)
Chloride: 102 mmol/L (ref 98–111)
Creatinine, Ser: 1.34 mg/dL — ABNORMAL HIGH (ref 0.61–1.24)
GFR, Estimated: 58 mL/min — ABNORMAL LOW (ref >60)
Glucose, Bld: 74 mg/dL (ref 70–99)
Potassium: 3.3 mmol/L — ABNORMAL LOW (ref 3.5–5.1)
Sodium: 138 mmol/L (ref 135–145)

## 2020-12-02 LAB — CBC
HCT: 44 % (ref 39.0–52.0)
Hemoglobin: 15 g/dL (ref 13.0–17.0)
MCH: 31.2 pg (ref 26.0–34.0)
MCHC: 34.1 g/dL (ref 30.0–36.0)
MCV: 91.5 fL (ref 80.0–100.0)
Platelets: 304 10*3/uL (ref 150–400)
RBC: 4.81 MIL/uL (ref 4.22–5.81)
RDW: 14.4 % (ref 11.5–15.5)
WBC: 10 10*3/uL (ref 4.0–10.5)
nRBC: 0 % (ref 0.0–0.2)

## 2020-12-02 LAB — HEMOGLOBIN A1C
Hgb A1c MFr Bld: 5.6 % (ref 4.8–5.6)
Mean Plasma Glucose: 114.02 mg/dL

## 2020-12-02 LAB — SODIUM, URINE, RANDOM: Sodium, Ur: 24 mmol/L

## 2020-12-02 MED ORDER — LISINOPRIL 10 MG PO TABS
30.0000 mg | ORAL_TABLET | Freq: Every day | ORAL | Status: DC
  Administered 2020-12-02 – 2020-12-04 (×3): 30 mg via ORAL
  Filled 2020-12-02 (×3): qty 3

## 2020-12-02 MED ORDER — CHLORTHALIDONE 25 MG PO TABS
25.0000 mg | ORAL_TABLET | Freq: Every day | ORAL | Status: DC
  Administered 2020-12-02 – 2020-12-04 (×3): 25 mg via ORAL
  Filled 2020-12-02 (×4): qty 1

## 2020-12-02 MED ORDER — AMLODIPINE BESYLATE 10 MG PO TABS
10.0000 mg | ORAL_TABLET | Freq: Every morning | ORAL | Status: DC
  Administered 2020-12-02 – 2020-12-06 (×5): 10 mg via ORAL
  Filled 2020-12-02 (×5): qty 1

## 2020-12-02 MED ORDER — POTASSIUM CHLORIDE CRYS ER 20 MEQ PO TBCR
40.0000 meq | EXTENDED_RELEASE_TABLET | Freq: Two times a day (BID) | ORAL | Status: AC
  Administered 2020-12-02: 40 meq via ORAL
  Filled 2020-12-02: qty 2

## 2020-12-02 NOTE — Progress Notes (Signed)
Pt arrived to room from ED. Pt alert and oriented x4. Pt denies pain at this time. Oriented to room. Placed on telemetry and wiped with chg wipes per policy. Plan of care reviewed with pt and granddaughter. No further questions at this time.

## 2020-12-02 NOTE — Evaluation (Signed)
Physical Therapy Evaluation Patient Details Name: Alec Sanchez MRN: [id number] DOB: [birth year] Today's Date: 12/02/2020  History of Present Illness  The pt is a 65 yo male presenting 10/6 with c/o drooping of face, slurred speech, AMS, weakness, and incontinence x2 days with reports of x2 falls. 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 PMH includes: previous CVA with R-sided deficits, HTN, and HLD.   Clinical Impression  Pt in bed upon arrival of PT, agreeable to evaluation at this time. Prior to admission the pt was independent with use of SPC, living alone, and able to drive. He reports he was completely independent with ADLs and IADLs after prior CVA. The pt now presents with limitations in functional mobility, static and dynamic stability, coordination, and strength due to above dx, and will continue to benefit from skilled PT to address these deficits. The pt was able to complete bed mobility with minA, but requires max cues for sequencing as well as minAto correct balance as pt with frequent posterior LOB that he needs assist to identify and correct. He was able to complete sit-stand transfer with min-modA and use of RW, but again needs max cues for technique, positioning, and physical assist to steady. Due to prior level of independence and family nearby to assist as needed, recommend CIR level therapies at d/c to maximize pt recovery and allow him to maintain his independence.       Recommendations for follow up therapy are one component of a multi-disciplinary discharge planning process, led by the attending physician.  Recommendations may be updated based on patient status, additional functional criteria and insurance authorization.  Follow Up Recommendations CIR    Equipment Recommendations  None recommended by PT (defer to post acute)    Recommendations for Other Services Rehab consult     Precautions / Restrictions Precautions Precautions:  Fall Precaution Comments: x2 falls in day PTA Restrictions Weight Bearing Restrictions: No      Mobility  Bed Mobility Overal bed mobility: Needs Assistance Bed Mobility: Supine to Sit     Supine to sit: Min assist     General bed mobility comments: minA to compelte trunk elevtion, then pt needing minA to maintain despite use of BUE due to frequent LOB    Transfers Overall transfer level: Needs assistance Equipment used: Rolling walker (2 wheeled) Transfers: Sit to/from Stand Sit to Stand: Mod assist         General transfer comment: modA to power up with cues for hand placement. cues to bring hands to RW. minA to steady in standing  Ambulation/Gait Ambulation/Gait assistance: Min assist Gait Distance (Feet): 12 Feet Assistive device: Rolling walker (2 wheeled) Gait Pattern/deviations: Step-to pattern;Decreased stride length;Shuffle;Trunk flexed Gait velocity: decreased Gait velocity interpretation: <1.31 ft/sec, indicative of household ambulator General Gait Details: pt with small steps minimal clearance bilaterally. cues to elongate strides with ptthen having significantly increased time, effort, and concentration to coordinate foot placement. slow and needs assist  Modified Rankin (Stroke Patients Only) Modified Rankin (Stroke Patients Only) Pre-Morbid Rankin Score: Slight disability Modified Rankin: Moderately severe disability     Balance Overall balance assessment: Needs assistance Sitting-balance support: Feet supported;Single extremity supported Sitting balance-Leahy Scale: Poor Sitting balance - Comments: multiple LOB posteriorly and to R. pt states he is unable to detect LOB, unable to correct without cues Postural control: Posterior lean;Right lateral lean Standing balance support: Bilateral upper extremity supported Standing balance-Leahy Scale: Poor Standing balance comment: dependent on BUE support and  physical assist                              Pertinent Vitals/Pain Pain Assessment: No/denies pain    Home Living Family/patient expects to be discharged to:: Private residence Living Arrangements: Alone Available Help at Discharge: Family;Available PRN/intermittently Type of Home: Apartment Home Access: Elevator     Home Layout: One level Home Equipment: Cane - single point;Grab bars - tub/shower;Shower seat - built in Additional Comments: daughter and son-in-law live nearby    Prior Function Level of Independence: Independent with assistive device(s)         Comments: pt reports using cane, drives to son-in-law's house every day for meals     Hand Dominance   Dominant Hand: Right    Extremity/Trunk Assessment   Upper Extremity Assessment Upper Extremity Assessment: Defer to OT evaluation    Lower Extremity Assessment Lower Extremity Assessment: RLE deficits/detail;LLE deficits/detail RLE Deficits / Details: pt needing increased time to complete movements for MMT, able to hold against mod resistance but LOB during testing. mild coordination deficits, but pt able to complete with increased time and effort RLE Sensation: WNL RLE Coordination: decreased fine motor LLE Deficits / Details: pt needing increased time to complete movements for MMT, able to hold against min-mod resistance but LOB during testing. mild coordination deficits, but pt able to complete with increased time and effort LLE Sensation: WNL LLE Coordination: decreased fine motor    Cervical / Trunk Assessment Cervical / Trunk Assessment: Kyphotic  Communication   Communication: No difficulties  Cognition Arousal/Alertness: Awake/alert Behavior During Therapy: WFL for tasks assessed/performed Overall Cognitive Status: No family/caregiver present to determine baseline cognitive functioning Area of Impairment: Safety/judgement;Awareness;Problem solving;Following commands                       Following Commands: Follows one  step commands inconsistently;Follows one step commands with increased time Safety/Judgement: Decreased awareness of safety;Decreased awareness of deficits Awareness: Intellectual Problem Solving: Slow processing;Decreased initiation;Difficulty sequencing;Requires verbal cues General Comments: pt needing increased time to follow cues/instructions. decreased insight to deficits, needing cues to notice and initiate corrections.      General Comments General comments (skin integrity, edema, etc.): VSS on RA    Exercises Other Exercises Other Exercises: toe taps to small bottle for LE coordination. improved speed and accuracy with LLE compared to RLE   Assessment/Plan    PT Assessment Patient needs continued PT services  PT Problem List Decreased strength;Decreased range of motion;Decreased activity tolerance;Decreased balance;Decreased mobility;Decreased coordination;Decreased safety awareness       PT Treatment Interventions Gait training;Stair training;DME instruction;Functional mobility training;Therapeutic activities;Therapeutic exercise;Balance training;Cognitive remediation;Patient/family education    PT Goals (Current goals can be found in the Care Plan section)  Acute Rehab PT Goals Patient Stated Goal: return to independence PT Goal Formulation: With patient Time For Goal Achievement: 12/16/20 Potential to Achieve Goals: Good    Frequency Min 4X/week   Barriers to discharge Decreased caregiver support pt from home alone, has family nearby he can stay with       AM-PAC PT "6 Clicks" Mobility  Outcome Measure Help needed turning from your back to your side while in a flat bed without using bedrails?: A Little Help needed moving from lying on your back to sitting on the side of a flat bed without using bedrails?: A Little Help needed moving to and from a bed to a chair (including a  wheelchair)?: A Lot Help needed standing up from a chair using your arms (e.g., wheelchair  or bedside chair)?: A Lot Help needed to walk in hospital room?: A Little Help needed climbing 3-5 steps with a railing? : Total 6 Click Score: 14    End of Session Equipment Utilized During Treatment: Gait belt Activity Tolerance: Patient tolerated treatment well Patient left: in chair;with call bell/phone within reach;with chair alarm set Nurse Communication: Mobility status PT Visit Diagnosis: Other abnormalities of gait and mobility (R26.89);Muscle weakness (generalized) (M62.81);Difficulty in walking, not elsewhere classified (R26.2)    Time: 1975-8832 PT Time Calculation (min) (ACUTE ONLY): 35 min   Charges:   PT Evaluation $PT Eval Moderate Complexity: 1 Mod PT Treatments $Gait Training: 8-22 mins       Vickki Muff, PT, DPT   Acute Rehabilitation Department Pager #: [phone]  Ronnie Derby 12/02/2020, 2:07 PM

## 2020-12-02 NOTE — Progress Notes (Addendum)
Subjective: No acute overnight events. Patient was seen at bedside during rounds today. Pt reports feeling well. Pt denies confusion, headaches, chest pain, shortness of breath, and abdominal pain.   He is updated on his diagnoses and is informed about the plan moving forward. He is encouraged to not ambulate without assistance. He expresses understanding and has no other complaints or concerns at this time.   Objective:  Vital signs in last 24 hours: Vitals:   12/02/20 0000 12/02/20 0200 12/02/20 0247 12/02/20 0803  BP: (!) 168/85 (!) 174/97 (!) 180/97 (!) 167/94  Pulse: 82 79 71 67  Resp: 20 17 15 16   Temp:  98.3 F (36.8 C) 98.6 F (37 C) 98.4 F (36.9 C)  TempSrc:  Oral Oral Oral  SpO2: 97% 98% 99% 98%   Constitutional: alert, well-appearing, in no acute distress HENT: normocephalic, atraumatic, mucous membranes moist Cardiovascular: regular rate and rhythm, no m/r/g Pulmonary/Chest: normal work of breathing on room air, lungs clear to auscultation bilaterally Abdominal: soft, non-tender to palpation, non-distended MSK: normal bulk and tone Neurological: alert & oriented x 3, strength exam 4/5 in bilateral upper and lower extremities. Excellent grip strength. No tongue deviation.  Skin: warm and dry Psych: normal behavior, normal affect   Assessment/Plan:  Principal Problem:   Acute ischemic stroke [facility]) Active Problems:   AKI (acute kidney injury) (HCC)  Alec Sanchez is a 65 y.o. with pertinent PMH of HTN, HLD, prior stroke who presents with falls back pain and admitted for AKI and acute cerebral infarction on hospital day 1  Acute cerebral infarction History of cerebral infarctions, most recent December 2021 with residual right-sided weakness Neg CT head, MRI brain with acute ischemic infarcts involving corpus callosum, right posterior corona radiata, and lentiform nucleus. 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 No hemorrhage or mass-effect. CTA  head/neck w/o acute changes. Neurology was consulted. Pt advised on risk factor modifications, LDL 144. A1C 5.6. Seen by SLP.  -Resumed home amlodipine, chlorthalidone, and lisinopril for BP control -Continued home ASA, Plavix, and atorvastatin -2D echo ordered -Telemetry  -Frequent neuro checks  -Fall precautions  -PT/OT orders placed     Resolving AKI, likely multifactorial Admission Cr 1.62, baseline ~1.2. High urine gravity suggesting dehydration, with noted Cr improvement to near baseline s/p IV fluids. No evidence of pyelo or hydro on imaging. Will now resume lisinopril and chlorthalidone.  -Continue to monitor BMP   HTN SBP 200s on presentation, improved to 160s ths AM. Patient non-compliant with home medications, likely contributing to uncontrolled BP and recurrent CVA. Educated on factor modifications.  -Resume home amlodipine, chlorthalidone, and lisinopril for BP management -Continue to monitor    Back pain Back pain reported on admission beginning after several falls. No midline tenderness on exam, with some muscle tension. Likely not cauda equina given normal rectal tone and straight leg raise neg, no concern for sciatica. CT negative.  Pain was likely MSK in nature. Does not endorse pain this AM.  - lidocaine patch and Robaxin   IMA vasculitis  Mildly elevated sed rate (27) and CRP (2.5) with mild leukocytosis on presentation (13.8). No concern for acute change, given good collateral flow noted on CT. ED vascular surgery consulted, recommending no further intervention at the time.    HLD LDL above goal 144, risk factor for CVA.  -Continue home atorvastatin     Best Practice: Diet: heart healthy  VTE: Heparin IVF: none  Code: Full  Signature: January 2022, MD  Internal Medicine Resident, PGY-1  [facility]  Pager: [phone] After 5pm on weekdays and 1pm on weekends: On Call pager [phone]

## 2020-12-02 NOTE — Evaluation (Signed)
Occupational Therapy Evaluation Patient Details Name: Alec Sanchez MRN: [id number] DOB: September 30, 1955 Today's Date: 12/02/2020   History of Present Illness The pt is a 65 yo male presenting 10/6 with c/o drooping of face, slurred speech, AMS, weakness, and incontinence x2 days with reports of x2 falls. 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 PMH includes: previous CVA with R-sided deficits, HTN, and HLD.   Clinical Impression   Patient admitted for the diagnosis above.  PTA he lives alone in an apartment, drove short distances, cared for his own ADL at home.  Was able to complete his own medication management and light meal prep.  Deficits impacting independence are listed below.  Currently he is needing up to Mod A for basic transfers and up to Max A for lower body ADL bed level due to poor balance and coordination.  CIR has been recommended for intensive multi disciplined approach to post acute rehab for an eventual return home.  OT will follow in the acute setting to maximize his functional status.       Recommendations for follow up therapy are one component of a multi-disciplinary discharge planning process, led by the attending physician.  Recommendations may be updated based on patient status, additional functional criteria and insurance authorization.   Follow Up Recommendations  CIR    Equipment Recommendations  Tub/shower seat    Recommendations for Other Services       Precautions / Restrictions Precautions Precautions: Fall Precaution Comments: x2 falls in day PTA Restrictions Weight Bearing Restrictions: No      Mobility Bed Mobility Overal bed mobility: Needs Assistance Bed Mobility: Sit to Supine     Supine to sit: Min assist Sit to supine: Min assist   General bed mobility comments: minA to compelte trunk elevtion, then pt needing minA to maintain despite use of BUE due to frequent LOB    Transfers Overall transfer  level: Needs assistance Equipment used: Rolling walker (2 wheeled) Transfers: Sit to/from [employer] Sit to Stand: Mod assist Stand pivot transfers: Mod assist;+2 safety/equipment       General transfer comment: modA to power up with cues for hand placement. cues to bring hands to RW. minA to steady in standing    Balance Overall balance assessment: Needs assistance Sitting-balance support: Feet supported;Single extremity supported Sitting balance-Leahy Scale: Poor Sitting balance - Comments: multiple LOB posteriorly and to R. pt states he is unable to detect LOB, unable to correct without cues Postural control: Posterior lean;Right lateral lean Standing balance support: Bilateral upper extremity supported Standing balance-Leahy Scale: Poor Standing balance comment: dependent on BUE support and physical assist                           ADL either performed or assessed with clinical judgement   ADL Overall ADL's : Needs assistance/impaired Eating/Feeding: Moderate assistance;Sitting   Grooming: Wash/dry hands;Wash/dry face;Moderate assistance;Sitting   Upper Body Bathing: Maximal assistance;Sitting   Lower Body Bathing: Maximal assistance;Bed level   Upper Body Dressing : Moderate assistance;Sitting   Lower Body Dressing: Maximal assistance;Bed level   Toilet Transfer: Moderate assistance;Stand-pivot;+2 for safety/equipment;RW   Toileting- Clothing Manipulation and Hygiene: Maximal assistance;Sit to/from stand       Functional mobility during ADLs: Moderate assistance;Rolling walker;+2 for safety/equipment       Vision Baseline Vision/History: 0 No visual deficits Patient Visual Report: No change from baseline       Perception  Praxis      Pertinent Vitals/Pain Pain Assessment: No/denies pain     Hand Dominance Right   Extremity/Trunk Assessment Upper Extremity Assessment Upper Extremity Assessment: RUE deficits/detail;LUE  deficits/detail RUE Deficits / Details: ataxic movements - needs extra time to set and move RUE Sensation: WNL RUE Coordination: decreased fine motor;decreased gross motor LUE Deficits / Details: decreased coordination - R greater than L LUE Sensation: WNL LUE Coordination: decreased fine motor;decreased gross motor   Lower Extremity Assessment Lower Extremity Assessment: Defer to PT evaluation   Cervical / Trunk Assessment Cervical / Trunk Assessment: Kyphotic   Communication Communication Communication: No difficulties   Cognition Arousal/Alertness: Awake/alert Behavior During Therapy: WFL for tasks assessed/performed Overall Cognitive Status: No family/caregiver present to determine baseline cognitive functioning Area of Impairment: Safety/judgement;Awareness;Problem solving;Following commands                       Following Commands: Follows one step commands inconsistently;Follows one step commands with increased time Safety/Judgement: Decreased awareness of safety;Decreased awareness of deficits Awareness: Intellectual Problem Solving: Slow processing;Decreased initiation;Difficulty sequencing;Requires verbal cues General Comments: pt needing increased time to follow cues/instructions. decreased insight to deficits, needing cues to notice and initiate corrections.   General Comments  VSS on RA    Exercises   Shoulder Instructions      Home Living Family/patient expects to be discharged to:: Private residence Living Arrangements: Alone Available Help at Discharge: Family;Available PRN/intermittently Type of Home: Apartment Home Access: Elevator     Home Layout: One level     Bathroom Shower/Tub: Producer, television/film/video: Standard Bathroom Accessibility: Yes How Accessible: Accessible via walker Home Equipment: Cane - single point;Grab bars - tub/shower;Shower seat - built in   Additional Comments: daughter and son-in-law live nearby       Prior Functioning/Environment Level of Independence: Independent with assistive device(s)        Comments: Per PT and confirmed: pt reports using cane, drives to son-in-law's house every day for meals        OT Problem List: Decreased range of motion;Decreased activity tolerance;Impaired balance (sitting and/or standing);Decreased knowledge of use of DME or AE;Decreased safety awareness;Decreased cognition;Impaired UE functional use      OT Treatment/Interventions: Self-care/ADL training;Therapeutic activities;Therapeutic exercise;Neuromuscular education;Cognitive remediation/compensation;DME and/or AE instruction;Patient/family education;Balance training    OT Goals(Current goals can be found in the care plan section) Acute Rehab OT Goals Patient Stated Goal: get better OT Goal Formulation: With patient Time For Goal Achievement: 12/16/20 Potential to Achieve Goals: Fair ADL Goals Pt Will Perform Grooming: with min guard assist;sitting Pt Will Perform Upper Body Bathing: with min guard assist;sitting Pt Will Perform Upper Body Dressing: with min guard assist;sitting Pt Will Transfer to Toilet: with min assist;stand pivot transfer;bedside commode Pt Will Perform Toileting - Clothing Manipulation and hygiene: with min assist;sit to/from stand Pt/caregiver will Perform Home Exercise Program: Increased strength;Both right and left upper extremity;With theraband;With minimal assist;With written HEP provided  OT Frequency: Min 2X/week   Barriers to D/C: Decreased caregiver support          Co-evaluation              AM-PAC OT "6 Clicks" Daily Activity     Outcome Measure Help from another person eating meals?: A Lot Help from another person taking care of personal grooming?: A Lot Help from another person toileting, which includes using toliet, bedpan, or urinal?: A Lot Help from another person bathing (including washing, rinsing, drying)?:  A Lot Help from another person to  put on and taking off regular upper body clothing?: A Lot Help from another person to put on and taking off regular lower body clothing?: A Lot 6 Click Score: 12   End of Session Equipment Utilized During Treatment: Gait belt;Rolling walker  Activity Tolerance: Patient tolerated treatment well Patient left: in bed;with call bell/phone within reach;with nursing/sitter in room  OT Visit Diagnosis: Unsteadiness on feet (R26.81);Other abnormalities of gait and mobility (R26.89);Repeated falls (R29.6);Muscle weakness (generalized) (M62.81);Ataxia, unspecified (R27.0);Other symptoms and signs involving cognitive function                Time: 1530-1550 OT Time Calculation (min): 20 min Charges:  OT General Charges $OT Visit: 1 Visit OT Evaluation $OT Eval Moderate Complexity: 1 Mod  12/02/2020  RP, OTR/L  Acute Rehabilitation Services  Office:  [phone]   Suzanna Obey 12/02/2020, 4:37 PM

## 2020-12-02 NOTE — Progress Notes (Addendum)
STROKE TEAM PROGRESS NOTE   INTERVAL HISTORY No acute events overnight.  No visitors at bedside.  Does not take ASA/Plavix reliablly. Also not taking BP meds as prescribed. When asked why he is not taking medications he replies that sometimes he gets "mannish". He does think his daughter might be able to help him with his medications at home.  We discussed his stroke diagnosis, ongoing work up and plan of care. The need for medication compliance was explained thoroughly. He agreed to try to be more compliant. His questions were answered.   Long phone conversation with daughter Alec Sanchez to update her on stroke finding, plan of care. She is a CNA/med tech. She is willing to supervise and assist him with his medications. She feels he has been down since three major deaths in the family over the past year (his mother and 2 sisters).   Vitals:   12/02/20 0000 12/02/20 0200 12/02/20 0247 12/02/20 0803  BP: (!) 168/85 (!) 174/97 (!) 180/97 (!) 167/94  Pulse: 82 79 71 67  Resp: Temp:  98.3 F (36.8 C) 98.6 F (37 C) 98.4 F (36.9 C)  TempSrc:  Oral Oral Oral  SpO2: 97% 98% 99% 98%   CBC:  Recent Labs  Lab 12/01/20 0041 12/01/20 0134 12/02/20 0414  WBC 13.8*  --  10.0  NEUTROABS 9.0*  --   --   HGB 16.5 17.0 15.0  HCT 49.5 50.0 44.0  MCV 91.5  --  91.5  PLT 354  --  304   Basic Metabolic Panel:  Recent Labs  Lab 12/01/20 0041 12/01/20 0134 12/02/20 0414  NA 137 140 138  K 3.4* 3.6 3.3*  CL 101 103 102  CO2 24  --  22  GLUCOSE 107* 104* 74  BUN 24* 29* 21  CREATININE 1.63* 1.60* 1.34*  CALCIUM 9.7  --  9.2   Lipid Panel:  Recent Labs  Lab 12/02/20 0414  CHOL 197  TRIG 104  HDL 32*  CHOLHDL 6.2  VLDL 21  LDLCALC 161*   HgbA1c:  Recent Labs  Lab 12/02/20 0414  HGBA1C 5.6   Urine Drug Screen:  Recent Labs  Lab 12/01/20 1401  LABOPIA NONE DETECTED  COCAINSCRNUR NONE DETECTED  LABBENZ NONE DETECTED  AMPHETMU NONE DETECTED  THCU POSITIVE*  LABBARB  NONE DETECTED    Alcohol Level No results for input(s): ETH in the last 168 hours.  IMAGING past 24 hours CT ANGIO HEAD NECK W WO CM  Result Date: 12/01/2020 CLINICAL DATA:  Neuro deficit, acute, stroke suspected. Slurred speech. EXAM: CT ANGIOGRAPHY HEAD AND NECK TECHNIQUE: Multidetector CT imaging of the head and neck was performed using the standard protocol during bolus administration of intravenous contrast. Multiplanar CT image reconstructions and MIPs were obtained to evaluate the vascular anatomy. Carotid stenosis measurements (when applicable) are obtained utilizing NASCET criteria, using the distal internal carotid diameter as the denominator. CONTRAST:  OMNIPAQUE IOHEXOL 350 MG/ML SOLN COMPARISON:  Head and neck CTA 01/31/2020 FINDINGS: CTA NECK FINDINGS Aortic arch: Standard 3 vessel aortic arch with mixed calcified and soft plaque and with detailed evaluation of the aorta performed on separate chest CTA. Patent brachiocephalic and subclavian arteries without significant stenosis. Right carotid system: Patent without evidence of a significant stenosis or dissection. Interval carotid stenting extending from the distal common into the proximal internal carotid arteries. Left carotid system: Patent with soft plaque in the mid common carotid artery not resulting in significant stenosis. Mixed  calcified and soft, mildly ulcerated plaque in the left carotid bulb results in approximately 60% stenosis, unchanged. Vertebral arteries: The left vertebral artery is patent and dominant with predominantly soft plaque in the proximal V1 segment resulting in progressive, severe stenosis (series 10, image 106). There is unchanged occlusion of the right vertebral artery at its origin with reconstitution of small distal V2 and V3 segments. A severe stenosis is again noted of the distal right V3 segment. Skeleton: Focally advanced cervical disc degeneration at C5-6. Moderate left facet arthrosis at C4-5. Other  neck: No evidence of cervical lymphadenopathy or mass. Upper chest: Reported separately. Review of the MIP images confirms the above findings CTA HEAD FINDINGS Anterior circulation: The internal carotid arteries are patent from skull base to carotid termini with similar appearance of mild left cavernous and mild to moderate bilateral paraclinoid stenoses. ACAs and MCAs are patent without evidence of a proximal branch occlusion or significant proximal stenosis. There is an unchanged mild left M1 stenosis. Mild-to-moderate branch vessel irregularity is noted bilaterally. No aneurysm is identified. Posterior circulation: The intracranial vertebral arteries are patent to the basilar. The left V4 segment is mildly irregular without significant stenosis. The right V4 segment is small with superimposed moderate to severe stenoses. Patent PICA, AICA, and SCA origins are identified bilaterally. The basilar artery is widely patent. Posterior communicating arteries are diminutive or absent. The PCAs are patent with moderate branch vessel irregularity but no flow limiting proximal stenosis. No aneurysm is identified. Venous sinuses: Not well evaluated due to arterial contrast timing. Anatomic variants: Dominant left vertebral artery. Review of the MIP images confirms the above findings IMPRESSION: 1. No emergent large vessel occlusion. 2. Interval right carotid stenting without residual stenosis. 3. Unchanged 60% proximal left ICA stenosis. 4. 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 5. Progressive, severe proximal left vertebral artery stenosis. 6. Unchanged mild to moderate bilateral intracranial ICA stenoses and mild left M1 stenosis. 7. Aortic Atherosclerosis (ICD10-I70.0). Electronically Signed   By: Sebastian Ache M.D.   On: 12/01/2020 16:32   MR BRAIN WO CONTRAST  Result Date: 12/01/2020 CLINICAL DATA:  Follow-up examination for acute stroke. EXAM: MRI HEAD WITHOUT  CONTRAST TECHNIQUE: Multiplanar, multiecho pulse sequences of the brain and surrounding structures were obtained without intravenous contrast. COMPARISON:  Prior CTA from earlier the same day. FINDINGS: Brain: Diffuse prominence of the CSF containing spaces compatible with generalized cerebral atrophy. Patchy and confluent T2/FLAIR hyperintensity involving the periventricular and deep white matter both cerebral hemispheres most consistent with chronic small vessel ischemic disease, moderate to advanced in nature. Remote lacunar infarcts present at the left greater than right basal ganglia and thalami. Appearance is progressed from prior. Three distinct foci of restricted diffusion all measuring approximately 8 mm each seen involving the right anterior genu of the corpus callosum (series 5, image 87), periventricular white matter of the right posterior corona radiata (series 5, image 84), and right lentiform nucleus (series 5, image 78). No associated hemorrhage or mass effect. No other diffusion abnormality to suggest acute or subacute ischemia. Additionally, there is patchy T2/FLAIR signal abnormality involving primarily the subcortical aspect of the posterior right frontoparietal region (series 11, image 20), with extension to involve the parasagittal right frontal region near the vertex (series 11, image 22). No associated diffusion abnormality or significant mass effect. Finding is nonspecific, but new as compared to prior MRI, and could reflect changes subacute ischemia, somewhat watershed in distribution, possibly related to previously identified  severe right ICA stenosis. Possible PRES could also conceivably have this appearance. No evidence for acute intracranial hemorrhage. Multiple scattered punctate chronic micro hemorrhages noted, primarily clustered about the deep gray nuclei, likely related to chronic poorly controlled hypertension. No mass lesion, midline shift or mass effect. Mild ventricular  prominence related global parenchymal volume loss of hydrocephalus. No extra-axial fluid collection. Pituitary gland suprasellar region normal. Midline structures intact. Vascular: Major intracranial vascular flow voids are maintained. Skull and upper cervical spine: Craniocervical junction within normal limits. Bone marrow signal intensity within normal limits. No scalp soft tissue abnormality. Sinuses/Orbits: Globes orbital soft tissues within normal limits. Paranasal sinuses are largely clear. No significant mastoid effusion. Inner ear structures grossly normal. Other: None. IMPRESSION: 1. Three distinct 8 mm acute ischemic infarcts involving the right anterior genu of the corpus callosum, periventricular white matter of the right posterior corona radiata, and right lentiform nucleus as above. No associated hemorrhage or mass effect. 2. Scattered T2/FLAIR signal abnormality involving the subcortical aspects of the right frontal and parietal regions as above, new as compared to prior MRI from 01/31/2020. Finding is nonspecific, but favored to reflect changes of subacute ischemia, possibly related to previously identified severe right ICA stenosis given the somewhat watershed distribution. Possible PRES could also conceivably have this appearance, and could be considered in the correct clinical setting. 3. Underlying age-related cerebral atrophy with advanced chronic microvascular ischemic disease, progressed from prior. Electronically Signed   By: Rise Mu M.D.   On: 12/01/2020 22:44   CT Angio Chest/Abd/Pel for Dissection W and/or Wo Contrast  Result Date: 12/01/2020 CLINICAL DATA:  Right flank pain, abdominal pain, slurred speech EXAM: CT ANGIOGRAPHY CHEST, ABDOMEN AND PELVIS TECHNIQUE: Non-contrast CT of the chest was initially obtained. Multidetector CT imaging through the chest, abdomen and pelvis was performed using the standard protocol during bolus administration of intravenous contrast.  Multiplanar reconstructed images and MIPs were obtained and reviewed to evaluate the vascular anatomy. CONTRAST:  OMNIPAQUE IOHEXOL 350 MG/ML SOLN COMPARISON:  None. FINDINGS: CTA CHEST FINDINGS Cardiovascular: The heart and great vessels are grossly unremarkable without pericardial effusion. Mild atherosclerosis of the coronary vasculature. There is no evidence of thoracic aortic aneurysm or dissection. Moderate atherosclerosis of the aortic arch is noted, with mild narrowing at the origin of the innominate and left common carotid arteries. No filling defects or pulmonary emboli. Mediastinum/Nodes: No enlarged mediastinal, hilar, or axillary lymph nodes. Thyroid gland, trachea, and esophagus demonstrate no significant findings. Lungs/Pleura: No acute airspace disease, effusion, or pneumothorax. The central airways are patent. Musculoskeletal: No acute or destructive bony lesions. Cortical and trabecular thickening of the left first rib may reflect underlying Paget disease. Reconstructed images demonstrate no additional findings. Review of the MIP images confirms the above findings. CTA ABDOMEN AND PELVIS FINDINGS VASCULAR Aorta: Normal caliber aorta without aneurysm, dissection, or significant stenosis. There is moderate multifocal atherosclerosis. At the level of the IMA, there is mild fat stranding along the ventral aspect of the aorta extending to the root of the IMA. This could reflect acute IMA thrombosis or underlying vasculitis. Celiac: Patent without evidence of aneurysm, dissection, vasculitis or significant stenosis. SMA: Patent without evidence of aneurysm, dissection, vasculitis or significant stenosis. Mild atherosclerosis. Renals: There are single bilateral renal arteries. On the left, there is mild to moderate atherosclerosis with approximately 50% stenosis in the distal left renal artery at the hilum. On the right, there is diffuse atheromatous plaque extending from the origin for  approximately 2.5 cm in  length, resulting in multifocal high-grade stenoses estimated 50-70%. No evidence of dissection aneurysm, or vasculitis. IMA: The IMA is occluded at its origin, with perivascular fat stranding suggesting possible acute IMA occlusion or vasculitis. No evidence of aneurysm or dissection. Distal branches of the IMA enhance normally via collateral flow. Inflow: Patent without evidence of aneurysm, dissection, vasculitis or significant stenosis. Mild to moderate atherosclerosis within the right common iliac artery without high-grade stenosis. Veins: No obvious venous abnormality within the limitations of this arterial phase study. Review of the MIP images confirms the above findings. NON-VASCULAR Hepatobiliary: No focal liver abnormality is seen. No gallstones, gallbladder wall thickening, or biliary dilatation. Pancreas: Unremarkable. No pancreatic ductal dilatation or surrounding inflammatory changes. Spleen: Normal in size without focal abnormality. Adrenals/Urinary Tract: Mild right renal cortical atrophy may reflect sequela of underlying atherosclerosis in the right renal artery described above. Left kidney is unremarkable. No evidence of nephrolithiasis or obstructive uropathy within either kidney. The adrenals and bladder are grossly unremarkable. Minimal excreted contrast within the left ureter and bladder. Stomach/Bowel: No bowel obstruction or ileus. Normal appendix right lower quadrant. No bowel wall thickening or inflammatory change. Specifically, no evidence of distal colonic wall thickening to suggest underlying ischemia. Lymphatic: No pathologic adenopathy within the abdomen or pelvis. Reproductive: Prostate is unremarkable. Other: No free fluid or free gas.  No abdominal wall hernia. Musculoskeletal: No acute or destructive bony lesions. Reconstructed images demonstrate no additional findings. Review of the MIP images confirms the above findings. IMPRESSION: 1. Occlusion of the  proximal IMA at its origin, with mild perivascular fat stranding along the ventral aspect of the distal aorta and IMA origin. Findings could reflect acute IMA occlusion or underlying vasculitis. The distal branches of the IMA enhance via collateral flow, with no evidence of bowel ischemia within the distal colon. 2. No evidence of thoracoabdominal aortic aneurysm or dissection. 3. Multifocal atherosclerosis with significant stenoses at the origin of the innominate artery, left common carotid artery, and right renal artery as above. 4. No evidence of pulmonary embolus. 5. Otherwise no acute intrathoracic, intra-abdominal, or intrapelvic process. 6. 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 7.  Aortic Atherosclerosis (ICD10-I70.0). Critical Value/emergent results were called by telephone at the time of interpretation on 12/01/2020 at 4:41 pm to provider [facility], who verbally acknowledged these results. Electronically Signed   By: Sharlet Salina M.D.   On: 12/01/2020 16:45    PHYSICAL EXAM Neurological Examination Mental Status: Alert and oriented x4, speech clear and fluent without errors or dysarthria.  Cranial Nerves: II: Temporal visual fields intact with no extinction to DSS.   III,IV, VI: EOMI. No nystagmus. No ptosis.  V,VII: Smile symmetric, facial temp sensation equal bilaterally VIII: hearing intact to voice IX,X: No hypophonia XI: Symmetric XII: Midline tongue extension Motor: Right :  Upper extremity   5/5                                      Left:     Upper extremity   5/5             Lower extremity   5/5  Lower extremity   5/5 Sensory: Temp intact throughout, bilaterally. No extinction to DSS.  Cerebellar: No ataxia  Gait: Deferred    ASSESSMENT/PLAN Alec Sanchez is an 65 y.o. male with a PMHx of stroke, carotid artery occlusion, carotid artery stenosis s/p Right TCAR on 04/12/2020 by Dr. Randie Heinz for asymptomatic high grade  ICA stenosisHLD and HTN who presented via EMS to the [facility] ED on Friday afternoon after family called for concerns of a stroke. He also has had right flank pain for a couple of days and had been evaluated at urgent care for this on Thursday afternoon; urinalysis was positive for hematuria; he was diagnosed with renal colic and discharged home. Upon arrival to home yesterday, patient was reportedly noted to have some slurring of his speech and right-sided facial droop. Daughter later mentioned to IM resident that the patient's first neurological deficits were noticed on Tuesday, at which time he had drooping of face, slurred speech worse than his baseline, AMS, loss of bowel and bladder continence, slowing of movement and speech, and difficulty holding things in his right hand. Patient later endorsed to IM resident that he had fallen twice on Tuesday and Wednesday and injured his back during the latter fall. On Friday, family had tried to contact patient by phone, but he did not answer, so EMS was called. On arrival to the ED, he was A&Ox3, appeared generally weak and reported not being able to sleep due to flank pain for a couple of days. Exam in the ED revealed that he had generalized weakness in all extremities and his speech seemed slurred. He was noted to be hypertensive, and admitted that he had not taken any of his medications on the day of presentation.  SBPs have been in the high 160's to 202 this admission.    Stroke: 3 small acute ischemic infarctions involving the right anterior genu of the corpus callosum, periventricular white matter of the right posterior corona radiata, and right lentiform nucleus which are likely due to SVD   MRI brain: Three distinct 8 mm acute ischemic infarcts involving the right anterior genu of the corpus callosum, periventricular white matter of the right posterior corona radiata, and right lentiform nucleus . No associated hemorrhage or mass effect. Scattered T2/FLAIR  signal abnormality involving the subcortical aspects of the right frontal and parietal regions as above, new as compared to prior MRI from 01/31/2020. 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 Also noted is underlying age-related cerebral atrophy with advanced chronic microvascular ischemic disease, progressed from prior. 3. CTA of head and neck: No emergent large vessel occlusion. Interval right carotid stenting without residual stenosis. 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 Progressive, severe proximal left vertebral artery stenosis. Unchanged 60% proximal left ICA stenosis. Unchanged mild to moderate bilateral intracranial ICA stenoses and mild left M1 stenosis. Aortic Atherosclerosisode Stroke CT head  2D Echo PENDING LDL 144 HgbA1c 5.6 VTE prophylaxis - recommended, per primary team     Diet   Diet Heart Room service appropriate? Yes; Fluid consistency: Thin   Prescribed but not taking ASA 325mg  and Plavix 75mg  as prescribed December 2021 after last stroke  Recommend continue ASA 325 mg and Plavix 75mg  with measures to improve compliance  Therapy recommendations:  CIR  Disposition:  TBD  Uncontrolled Hypertension Home meds:  Not taking his medications per his admission  Hypertensive to 202 since admission, currently stable Permissive hypertension (OK if < 220/120) but gradually normalize  in 5-7 days Long-term BP goal normotensive  Hyperlipidemia Home meds: lipitor 80mg  LDL 144, goal < 70  High intensity statin : Keep home lipitor  as he has not been complaint with taking it  Continue statin at discharge       Non-compliance with medication regimen Patient reports noncompliance in a ongoing way Barriers discussed with daughter and she will assist with medication management at home moving forward Home health RN for medication management at home should be considered if  possible ?depression/grief  may be contributing to lack of caring about taking care of himself (grieving 3 recent deaths in the family)  Other Stroke Risk Factors Advanced Age >/= 93  History of strokes, 2021:  L basal ganglia / corona radiata infarct. Punctate L periatrial white matter infarct. Moderate small vessel disease w/ chronic lacunes and microhemorrhages. Details not available for other strokes.   Other Active Problems   Hospital day # 1  This patient was seen and evaluated with Dr. 2022. He directed the plan of care.  Viviann Spare, NP-C   ATTENDING ATTESTATION:  Dr. Shon Hale evaluated pt independently, reviewed imaging, chart, labs. Discussed and formulated plan with the APP. Please see APP note above for details.   Total 30 minutes spent on counseling patient and coordinating care, writing notes and reviewing chart.   Andria Head,MD   To contact Stroke Continuity provider, please refer to Viviann Spare. After hours, contact General Neurology

## 2020-12-02 NOTE — Evaluation (Signed)
Clinical/Bedside Swallow Evaluation Patient Details  Name: Alec Sanchez MRN: [id number] Date of Birth: 06-20-1955  Today's Date: 12/02/2020 Time: SLP Start Time (ACUTE ONLY): 1050 SLP Stop Time (ACUTE ONLY): 1115 SLP Time Calculation (min) (ACUTE ONLY): 25 min  Past Medical History:  Past Medical History:  Diagnosis Date   Anxiety    Carotid artery occlusion    Depression    Hyperlipidemia    Hypertension    Stroke [facility])    Past Surgical History:  Past Surgical History:  Procedure Laterality Date   ANKLE SURGERY Left    TRANSCAROTID ARTERY REVASCULARIZATION  Right 04/12/2020   Procedure: RIGHT TRANSCAROTID ARTERY REVASCULARIZATION;  Surgeon: Maeola Harman, MD;  Location: [facility] OR;  Service: Vascular;  Laterality: Right;   ULTRASOUND GUIDANCE FOR VASCULAR ACCESS Left 04/12/2020   Procedure: ULTRASOUND GUIDANCE FOR VASCULAR ACCESS, left femoral vein;  Surgeon: Maeola Harman, MD;  Location: [facility] OR;  Service: Vascular;  Laterality: Left;   HPI:  Alec Sanchez is an 65 y.o. male with a PMHx of stroke, carotid artery occlusion, carotid artery stenosis s/p CEA, HLD and HTN who presented via EMS to the [facility] ED on Friday afternoon after family called for concerns of a stroke. MRI was obtained, revealing 3 small acute ischemic infarctions with locations and sizes that were most consistent with small vessel strokes.    Assessment / Plan / Recommendation  Clinical Impression  Pts swallow appears grossly functional. Of note, pt reports wearing upper and lower dentures at baseline (currently at home). Assisted pt to call daughter who is to bring dentures. Pt exhibited some prolonged mastication of solids but with increased time, was able to clear oral cavity. No overt pocketing appreciated. Pt without overt s/sx of aspiration with any PO including 3 oz water challenge. Pt did exhibit lingual coating (concerning for thrush); consider intervention for suspected thrush per MD  discretion. Encouraged pt to increase oral care to TID. Soft bristle toothbrush utilized with some reduction in white coating during oral care directed by SLP. Recommend continue regular thin liquid diet. No further needs identified for swallowing. SLP Visit Diagnosis: Dysphagia, unspecified (R13.10)    Aspiration Risk  Mild aspiration risk    Diet Recommendation   Regular, thin liquids  Medication Administration: Whole meds with liquid    Other  Recommendations Oral Care Recommendations: Other (Comment) (oral care TID, consider intervention for suspected thrush)    Recommendations for follow up therapy are one component of a multi-disciplinary discharge planning process, led by the attending physician.  Recommendations may be updated based on patient status, additional functional criteria and insurance authorization.  Follow up Recommendations Skilled Nursing facility;24 hour supervision/assistance      Frequency and Duration            Prognosis        Swallow Study   General Date of Onset: 12/01/20 HPI: Alec Sanchez is an 65 y.o. male with a PMHx of stroke, carotid artery occlusion, carotid artery stenosis s/p CEA, HLD and HTN who presented via EMS to the [facility] ED on Friday afternoon after family called for concerns of a stroke. MRI was obtained, revealing 3 small acute ischemic infarctions with locations and sizes that were most consistent with small vessel strokes. Type of Study: Bedside Swallow Evaluation Previous Swallow Assessment: none on file Diet Prior to this Study: Regular;Thin liquids Temperature Spikes Noted: No Respiratory Status: Room air History of Recent Intubation: No Behavior/Cognition: Alert;Cooperative Oral Cavity Assessment: Other (comment) (coated tongue, concerning for  thrush; RN notified; coating decreased some with diligent oral care by SLP) Oral Care Completed by SLP: Yes Oral Cavity - Dentition: Dentures, not available;Other (Comment) (assisted pt to  call family and daughter to bring dentures) Vision: Functional for self-feeding Self-Feeding Abilities: Needs assist Patient Positioning: Upright in bed Baseline Vocal Quality: Normal Volitional Swallow: Able to elicit    Oral/Motor/Sensory Function Overall Oral Motor/Sensory Function: Generalized oral weakness   Ice Chips Ice chips: Not tested   Thin Liquid Thin Liquid: Within functional limits Presentation: Cup;Straw    Nectar Thick Nectar Thick Liquid: Not tested   Honey Thick Honey Thick Liquid: Not tested   Puree Puree: Within functional limits   Solid     Solid: Impaired Presentation: Self Fed Oral Phase Impairments: Impaired mastication Oral Phase Functional Implications: Prolonged oral transit Pharyngeal Phase Impairments: Suspected delayed Swallow      Naylee Frankowski H. MA, CCC-SLP Acute Rehabilitation Services   12/02/2020,11:24 AM

## 2020-12-03 ENCOUNTER — Inpatient Hospital Stay (HOSPITAL_COMMUNITY): Payer: No Typology Code available for payment source

## 2020-12-03 DIAGNOSIS — I6389 Other cerebral infarction: Secondary | ICD-10-CM

## 2020-12-03 DIAGNOSIS — I639 Cerebral infarction, unspecified: Secondary | ICD-10-CM | POA: Diagnosis not present

## 2020-12-03 LAB — ECHOCARDIOGRAM COMPLETE
Area-P 1/2: 2.24 cm2
S' Lateral: 2.4 cm
Weight: 2881.85 oz

## 2020-12-03 LAB — BASIC METABOLIC PANEL
Anion gap: 11 (ref 5–15)
Anion gap: 10 (ref 5–15)
BUN: 15 mg/dL (ref 8–23)
BUN: 18 mg/dL (ref 8–23)
CO2: 24 mmol/L (ref 22–32)
CO2: 23 mmol/L (ref 22–32)
Calcium: 9.1 mg/dL (ref 8.9–10.3)
Calcium: 9.1 mg/dL (ref 8.9–10.3)
Chloride: 103 mmol/L (ref 98–111)
Chloride: 102 mmol/L (ref 98–111)
Creatinine, Ser: 1.16 mg/dL (ref 0.61–1.24)
Creatinine, Ser: 1.71 mg/dL — ABNORMAL HIGH (ref 0.61–1.24)
GFR, Estimated: >60 mL/min (ref >60)
GFR, Estimated: 44 mL/min — ABNORMAL LOW (ref >60)
Glucose, Bld: 100 mg/dL — ABNORMAL HIGH (ref 70–99)
Glucose, Bld: 94 mg/dL (ref 70–99)
Potassium: 3.4 mmol/L — ABNORMAL LOW (ref 3.5–5.1)
Potassium: 4.1 mmol/L (ref 3.5–5.1)
Sodium: 138 mmol/L (ref 135–145)
Sodium: 135 mmol/L (ref 135–145)

## 2020-12-03 LAB — MAGNESIUM: Magnesium: 2 mg/dL (ref 1.7–2.4)

## 2020-12-03 MED ORDER — POTASSIUM CHLORIDE CRYS ER 20 MEQ PO TBCR
40.0000 meq | EXTENDED_RELEASE_TABLET | Freq: Once | ORAL | Status: AC
  Administered 2020-12-03: 40 meq via ORAL
  Filled 2020-12-03: qty 2

## 2020-12-03 NOTE — Progress Notes (Addendum)
Subjective:  Patient seen in room. He is up in chair after working with PT this morning. States he is working well with PT today. Discussed results of echo with patient. Discussed plan for inpatient rehab after discharge which he is agreeable to. No acute complaints at this time. Emphasized importance of medication adherence to help reduce risk of future stroke. Denies issues with price of medication. Mentions poor adherence to medications because of frequent urination, discussed this may be due to his chlorthalidone which could be changed in the future, but statin and antithrombotics would not increase urination. Educated on the importance of tobacco cessation.   Pt has no other complaints or concerns at this time.   Objective:  Vital signs in last 24 hours: Vitals:   12/03/20 0827 12/03/20 1218 12/03/20 1655 12/03/20 1942  BP: (!) 149/72 (!) 153/83 (!) 155/84 (!) 168/95  Pulse: 66 72 63 65  Resp: 16 14 20 16   Temp: 98.2 F (36.8 C) 98.4 F (36.9 C) 98.3 F (36.8 C) 98.5 F (36.9 C)  TempSrc: Oral Oral Oral Oral  SpO2: 97% 99% 100% 98%  Weight:       Constitutional: alert, well-appearing, in no acute distress HENT: normocephalic, atraumatic, mucous membranes moist Cardiovascular: regular rate and rhythm, no m/r/g Pulmonary/Chest: normal work of breathing on room air, lungs CTAB Abdominal: soft, non-tender to palpation, non-distended MSK: normal bulk and tone Neurological: alert & oriented x 3, strength exam 4/5 in bilateral upper and lower extremities. Excellent grip strength. No tongue deviation.  Skin: warm and dry Psych: normal behavior, normal affect   Assessment/Plan:  Principal Problem:   Acute ischemic stroke (HCC) Active Problems:   AKI (acute kidney injury) (HCC)  Alec Sanchez is a 65 y.o. with pertinent PMH of HTN, HLD, prior stroke who presents with falls back pain and admitted for AKI and acute cerebral infarction.   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 CTA head/neck w/o acute changes. Pt advised on risk factor modifications. No intracardiac source of embolism noted on echo (LVEF 60-65%). L ventricle fxn normal, grade 1 DD noted.  detected on this transthoracic study. Continues to make progress with PT. Awaiting final neurology recommendations and CIR placement pending insurance authorization. -Continue amlodipine, chlorthalidone, and lisinopril  -Continue ASA, Plavix, and atorvastatin daily  -Telemetry  -Frequent neuro checks  -Fall precautions  -PT/OT following      Resolving AKI, likely multifactorial Inc in Cr to 1.71, baseline1.2.  - s/p LR bolus 1 L this AM - Continue to monitor BMP   HTN Stable sbp 160s on home meds  -Continue amlodipine, chlorthalidone, and lisinopril  -Monitor vitals     Back pain Likely in the setting of recent falls. CT negative, no worsening sensation.  Likely MSK strain in the setting falls. -Tylenol prn -Lidocaine patch and Robaxin   IMA vasculitis  Mildly elevated sed rate (27) and CRP (2.5) with mild leukocytosis on presentation (13.8). No concern for acute change. Vascular surgery recommending no further intervention at the time.    HLD LDL above goal 144, risk factor for CVA.  -Continue home atorvastatin     Best Practice: Diet: heart healthy  VTE: Heparin IVF: none  Code: Full  Signature: January 2022, MD  Internal Medicine Resident, PGY-1 [name]  Pager: [phone] After 5pm on weekdays and 1pm on weekends: On  Call pager [phone]

## 2020-12-03 NOTE — Progress Notes (Signed)
Inpatient Rehab Admissions Coordinator Note:   Per PT/OT patient was screened for CIR candidacy by Parisha Beaulac Luvenia Starch, CCC-SLP. At this time, pt appears to be a potential candidate for CIR. Please place an IP Rehab MD consult order if pt would like to be considered.  Please contact me any with questions.Wolfgang Phoenix, MS, CCC-SLP Admissions Coordinator [phone] 12/03/20 9:22 AM

## 2020-12-03 NOTE — PMR Pre-admission (Signed)
PMR Admission Coordinator Pre-Admission Assessment  Patient: Alec Sanchez is an 64 y.o., male MRN: [id number] DOB: [birth year] Height:   Weight: 84.1 kg  Insurance Information HMO:   PPO:      PCP:      IPA:      80/20:      OTHER:  PRIMARY: [place]     Policy#: [id number]      Subscriber: patient CM Name: Rubin Payor      Phone#: [phone]     Fax#: [phone] Pre-Cert#: TBD  approved for 7 days    Employer:  Benefits:  Phone #: [phone]     Name:  Eff. Date: active     Deduct: none      Out of Pocket Max: none      L CIR per [facility] contract 100% Providers: in-network  SECONDARY: [employer] Medicare      Policy#: [id number]     Phone#: [phone]  Financial Counselor:       Phone#:   The "Data Collection Information Summary" for patients in Inpatient Rehabilitation Facilities with attached "Privacy Act [place] Records" was provided and verbally reviewed with: N/A  Emergency Contact Information Contact Information     Name Relation Home Work [place], [place] Daughter   [phone]      Current Medical History  Patient Admitting Diagnosis: acute ischemic stroke  History of Present Illness:  65 year old right-handed male with history of hypertension, tobacco use, hyperlipidemia, 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  Per chart review patient lives alone.  1 level apartment.  Daughter and son-in-law live nearby.  Independent with assistive device prior to admission and still drives short distances.  Presented 12/01/2020 with slurred speech and right facial droop as well as altered mental status.  Cranial CT scan showed chronic atrophic and ischemic changes without acute findings.  CT angiogram head and neck no emergent large vessel occlusion.   Unchanged 60% proximal left ICA stenosis.  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  MRI of the brain showed 3 distinct 8 mm acute ischemic infarct involving the right anterior genu of the corpus callosum periventricular white matter of the right posterior corona radiata and right lentiform nucleus.  No associated hemorrhage or mass-effect.  Patient did not receive TPA.  CT angiogram chest abdomen pelvis showed occlusion of the proximal IMA at its origin, with mild perivascular fat stranding along the ventral aspect of the distal aorta and IMA origin.  Echocardiogram with ejection fraction of 60 to 65% no wall motion abnormalities grade 1 diastolic dysfunction.  Admission chemistries unremarkable except potassium 3.4, glucose 107, BUN 24, creatinine 1.63, WBC 13,800, urine drug screen positive marijuana, troponin 47, D-dimer 1.28, sedimentation rate 27.  Currently maintained on aspirin 81 mg daily and Plavix 75 mg daily for CVA prophylaxis x3 weeks then aspirin alone.  Subcutaneous heparin for DVT prophylaxis.  Tolerating a regular consistency diet .  Complete NIHSS TOTAL: 0  Patient's medical record from [facility] has been reviewed by the rehabilitation admission coordinator and physician.  Past Medical History  Past Medical History:  Diagnosis Date   Anxiety    Carotid artery occlusion    Depression    Hyperlipidemia    Hypertension    Stroke [facility])    Has  the patient had major surgery during 100 days prior to admission? No  Family History   family history is not on file.  Current Medications  Current Facility-Administered Medications:    acetaminophen (TYLENOL) tablet 650 mg, 650 mg, Oral, Q6H PRN, 650 mg at 12/04/20 0807 **OR** acetaminophen (TYLENOL) suppository 650 mg, 650 mg, Rectal, Q6H PRN, Gaylan Gerold, DO   amLODipine (NORVASC) tablet 10 mg, 10 mg, Oral, q morning, Iona Beard, MD, 10 mg at 12/06/20 0802   aspirin EC tablet  81 mg, 81 mg, Oral, Daily, Gaylan Gerold, DO, 81 mg at 12/06/20 0800   atorvastatin (LIPITOR) tablet 80 mg, 80 mg, Oral, QHS, Gaylan Gerold, DO, 80 mg at 12/05/20 2126   chlorthalidone (HYGROTON) tablet 25 mg, 25 mg, Oral, Daily, Lajean Manes, MD, 25 mg at 12/06/20 0800   clopidogrel (PLAVIX) tablet 75 mg, 75 mg, Oral, Daily, Gaylan Gerold, DO, 75 mg at 12/06/20 0800   heparin injection 5,000 Units, 5,000 Units, Subcutaneous, Q8H, Gaylan Gerold, DO, 5,000 Units at 12/06/20 0557   lidocaine (LIDODERM) 5 % 1 patch, 1 patch, Transdermal, Q24H, Gaylan Gerold, DO, 1 patch at 12/05/20 2130   methocarbamol (ROBAXIN) tablet 500 mg, 500 mg, Oral, Q8H PRN, Gaylan Gerold, DO, 500 mg at 12/04/20 0807   PARoxetine (PAXIL) tablet 20 mg, 20 mg, Oral, Daily, Gaylan Gerold, DO, 20 mg at 12/06/20 0800  Patients Current Diet:  Diet Order             Diet - low sodium heart healthy           Diet Heart Room service appropriate? Yes with Assist; Fluid consistency: Thin  Diet effective now                  Precautions / Restrictions Precautions Precautions: Fall Precaution Comments: x2 falls in day PTA Restrictions Weight Bearing Restrictions: No   Has the patient had 2 or more falls or a fall with injury in the past year? Yes  Prior Activity Level Community (5-7x/wk): drives, gets out of house almost daily  Prior Functional Level Self Care: Did the patient need help bathing, dressing, using the toilet or eating? Independent  Indoor Mobility: Did the patient need assistance with walking from room to room (with or without device)? Independent  Stairs: Did the patient need assistance with internal or external stairs (with or without device)? Independent  Functional Cognition: Did the patient need help planning regular tasks such as shopping or remembering to take medications? Independent  Patient Information Are you of Hispanic, Latino/a,or Spanish origin?: A. No, not of Hispanic, Latino/a, or Spanish  origin What is your race?: B. Black or African American Do you need or want an interpreter to communicate with a doctor or health care staff?: 0. No  Patient's Response To:  Health Literacy and Transportation Is the patient able to respond to health literacy and transportation needs?: Yes Health Literacy - How often do you need to have someone help you when you read instructions, pamphlets, or other written material from your doctor or pharmacy?: Never In the past 12 months, has lack of transportation kept you from medical appointments or from getting medications?: No In the past 12 months, has lack of transportation kept you from meetings, work, or from getting things needed for daily living?: No  Home Assistive Devices / [place]: Kasandra Knudsen - single point, Grab bars - tub/shower, Shower seat - built in  Prior Device Use: Indicate devices/aids used by the patient prior  to current illness, exacerbation or injury?  cane  Current Functional Level Cognition  Overall Cognitive Status: No family/caregiver present to determine baseline cognitive functioning Current Attention Level: Selective Orientation Level: Oriented X4 Following Commands: Follows one step commands consistently, Follows one step commands with increased time Safety/Judgement: Decreased awareness of deficits General Comments: Pt asleep on entry and initially lethargic and slow to respond. Improved once up and amb.    Extremity Assessment (includes Sensation/Coordination)  Upper Extremity Assessment: RUE deficits/detail, LUE deficits/detail RUE Deficits / Details: ataxic movements - increased time for motor planning RUE Sensation: WNL RUE Coordination: decreased fine motor, decreased gross motor LUE Deficits / Details: decreased coordination - R greater than L LUE Sensation: WNL LUE Coordination: decreased fine motor, decreased gross motor  Lower Extremity Assessment: Defer to PT evaluation RLE Deficits /  Details: pt needing increased time to complete movements for MMT, able to hold against mod resistance but LOB during testing. mild coordination deficits, but pt able to complete with increased time and effort RLE Sensation: WNL RLE Coordination: decreased fine motor LLE Deficits / Details: pt needing increased time to complete movements for MMT, able to hold against min-mod resistance but LOB during testing. mild coordination deficits, but pt able to complete with increased time and effort LLE Sensation: WNL LLE Coordination: decreased fine motor    ADLs  Overall ADL's : Needs assistance/impaired Eating/Feeding: Moderate assistance, Sitting Grooming: Wash/dry hands, Wash/dry face, Moderate assistance, Sitting Upper Body Bathing: Maximal assistance, Sitting Lower Body Bathing: Maximal assistance, Bed level Upper Body Dressing : Moderate assistance, Sitting Lower Body Dressing: Sitting/lateral leans, Minimal assistance Lower Body Dressing Details (indicate cue type and reason): Min A to don socks via figure four position sitting EOB, assist to cross LEs and intermittent cues/min guard to correct LOB sitting EOB Toilet Transfer: Moderate assistance, Stand-pivot, +2 for safety/equipment, RW Toileting- Clothing Manipulation and Hygiene: Maximal assistance, Sit to/from stand Functional mobility during ADLs: Minimal assistance, +2 for safety/equipment, Rolling walker, Cueing for sequencing, Cueing for safety General ADL Comments: Pt with improving awareness of deficits though still noted with sitting/standing balance impairments, cues needed for body mechanics to decrease fall risk during tasks.    Mobility  Overal bed mobility: Needs Assistance Bed Mobility: Supine to Sit Supine to sit: Min assist, HOB elevated Sit to supine: Min assist General bed mobility comments: Assist to elevate trunk into sitting and bring hips to EOB    Transfers  Overall transfer level: Needs assistance Equipment  used: Rolling walker (2 wheeled) Transfers: Sit to/from Stand Sit to Stand: Min assist Stand pivot transfers: Mod assist, +2 safety/equipment General transfer comment: Assist to bring hips up and for balance    Ambulation / Gait / Stairs / Wheelchair Mobility  Ambulation/Gait Ambulation/Gait assistance: Mod assist, +2 safety/equipment Gait Distance (Feet): 90 Feet (90' x 1, 50' x 1) Assistive device: Rolling walker (2 wheeled) Gait Pattern/deviations: Step-to pattern, Decreased step length - right, Decreased step length - left, Shuffle, Trunk flexed General Gait Details: Assist for balance and support. As pt fatigues his steps become shorter and he lets walker get too far in front of him. Verbal cues to incr step length and stay closer to walker Gait velocity: decreased Gait velocity interpretation: <1.31 ft/sec, indicative of household ambulator    Posture / Balance Dynamic Sitting Balance Sitting balance - Comments: UE support Balance Overall balance assessment: Needs assistance, History of Falls Sitting-balance support: Feet supported, Bilateral upper extremity supported Sitting balance-Leahy Scale: Poor Sitting balance - Comments:  UE support Postural control: Left lateral lean Standing balance support: Bilateral upper extremity supported Standing balance-Leahy Scale: Poor Standing balance comment: walker and min assist for static standing    Special needs/care consideration External Urinary Catheter   Previous Home Environment  Living Arrangements: Alone Available Help at Discharge: Family, Available PRN/intermittently Type of Home: Apartment Home Layout: One level Home Access: Building control surveyor Shower/Tub: Multimedia programmer: Standard Bathroom Accessibility: Yes How Accessible: Accessible via walker Home Care Services: Yes Type of Home Care Services: Home RN Additional Comments: daughter and son-in-law live nearby  Discharge Living Setting Plans for  Discharge Living Setting: Patient's home Type of Home at Discharge: Apartment Discharge Home Layout: One level Discharge Home Access: Elevator Discharge Bathroom Shower/Tub: Walk-in shower Discharge Bathroom Toilet: Standard Discharge Bathroom Accessibility: Yes How Accessible: Accessible via walker Does the patient have any problems obtaining your medications?: No  Social/Family/Support Systems Anticipated Caregiver: [place], daughter Anticipated Ambulance person Information: [phone] Caregiver Availability: Intermittent Discharge Plan Discussed with Primary Caregiver: Yes Is Caregiver In Agreement with Plan?: Yes Does Caregiver/Family have Issues with Lodging/Transportation while Pt is in Rehab?: No  Goals Patient/Family Goal for Rehab: Mod I-Supervision: PT/OT Expected length of stay: 7-10 days Pt/Family Agrees to Admission and willing to participate: Yes Program Orientation Provided & Reviewed with Pt/Caregiver Including Roles  & Responsibilities: Yes  Decrease burden of Care through IP rehab admission: NA  Possible need for SNF placement upon discharge: Not anticipated  Patient Condition: I have reviewed medical records from [facility], spoken with CM, and patient and daughter. I met with patient at the bedside and discussed via phone for inpatient rehabilitation assessment.  Patient will benefit from ongoing PT and OT, can actively participate in 3 hours of therapy a day 5 days of the week, and can make measurable gains during the admission.  Patient will also benefit from the coordinated team approach during an Inpatient Acute Rehabilitation admission.  The patient will receive intensive therapy as well as Rehabilitation physician, nursing, social worker, and care management interventions.  Due to bladder management, safety, disease management, medication administration, pain management, and patient education the patient requires 24 hour a day rehabilitation  nursing.  The patient is currently min to mod assist overall with mobility and basic ADLs.  Discharge setting and therapy post discharge at home with home health is anticipated.  Patient has agreed to participate in the Acute Inpatient Rehabilitation Program and will admit today.  Preadmission Screen Completed By:  Bethel Born, 12/06/2020 11:09 AM ______________________________________________________________________   Discussed status with Dr. Posey Pronto on 10/12/202 at 1108 and received approval for admission today.  Admission Coordinator:  Bethel Born, [place], time 0277 Date 12/06/2020   Assessment/Plan: Diagnosis: acute ischemic stroke Does the need for close, 24 hr/day Medical supervision in concert with the patient's rehab needs make it unreasonable for this patient to be served in a less intensive setting? 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 Yes Does the patient require coordinated care of a physician, rehab nurse, PT, OT to address physical and functional deficits in the context of the above medical diagnosis(es)? Yes Addressing deficits in the following areas: balance, endurance, locomotion, strength, transferring, bathing, dressing, toileting, and psychosocial support Can the patient actively participate in an intensive  therapy program of at least 3 hrs of therapy 5 days a week? Yes The potential for patient to make measurable gains while on inpatient rehab is excellent Anticipated functional outcomes upon discharge from inpatient rehab: supervision PT, supervision OT, n/a SLP Estimated rehab length of stay to reach the above functional goals is: 8-12  days. Anticipated discharge destination: Home 10. Overall Rehab/Functional Prognosis: good   MD Signature: Delice Lesch, MD, ABPMR

## 2020-12-03 NOTE — Progress Notes (Signed)
   Subjective: No acute overnight events.  Patient changed to report back pain.  Did improve yesterday with Tylenol, lidocaine patch, muscle relaxers.  He has not received any of these medications this morning.  Discussed that his CT did not show any concerning findings.  Likely in the setting of recent fall and will take time to improve.  He remains agreeable for plan for inpatient rehabilitation following discharge. Objective:  Vital signs in last 24 hours: Vitals:   12/02/20 2006 12/02/20 2316 12/03/20 0352 12/03/20 0827  BP: (!) 186/90 (!) 189/95 (!) 194/93 (!) 149/72  Pulse: 65 67 62 66  Resp: 19 20 16 16   Temp: 98.8 F (37.1 C) 98.4 F (36.9 C) 98.7 F (37.1 C) 98.2 F (36.8 C)  TempSrc: Oral Oral Oral Oral  SpO2: 98% 98% 99% 97%  Weight:   81.7 kg    Constitutional: alert, well-appearing, somewhat uncomfortable laying on his side. HENT: normocephalic, atraumatic, mucous membranes moist Cardiovascular: regular rate and rhythm, no m/r/g Pulmonary/Chest: normal work of breathing on room air, lungs clear to auscultation bilaterally Abdominal: soft, non-tender to palpation, non-distended MSK: normal bulk and tone, Neurological: alert & oriented x 3, strength exam 4/5 in bilateral upper and lower extremities.  5 out of 5 grip strength bilaterally.n speech is fluent, no facial droop.  Normal sensation Skin: warm and dry Psych: normal behavior, normal affect   Assessment/Plan:  Principal Problem:   Acute ischemic stroke [facility]) Active Problems:   AKI (acute kidney injury) (HCC)  Mattie Guarino is a 65 y.o. with pertinent PMH of HTN, HLD, prior stroke who presents with falls back pain and admitted for AKI and acute cerebral infarction on hospital day 1  Acute cerebral infarction History of cerebral infarctions, most recent December 2021 with residual right-sided weakness Neg CT head, MRI brain with acute ischemic infarcts involving corpus callosum, right posterior corona radiata,  and lentiform nucleus. 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 No hemorrhage or mass-effect. CTA head/neck w/o acute changes. Neurology was consulted.  -Continue amlodipine, chlorthalidone and lisinopril -Aspirin 325 mg daily and Plavix 75 mg daily -Continue atorvastatin 40 mg daily -Echocardiogram -Telemetry  -Fall precautions  -PT/OT orders placed     AKI resolved Kidney function has returned to baseline this morning with BUN of 15 and creatinine of 1.6, prior baseline around 1.18 -Continue to monitor BMP   HTN BP is improved to about 160 systolic receiving home meds yesterday Continue amlodipine, chlorthalidone, and lisinopril -Monitor vitals   Back pain Likely in the setting of recent falls.  CT negative.  No worsening sensation.  Likely musculoskeletal strain in the setting falls. -Tylenol as needed for pain - lidocaine patch and Robaxin   IMA vasculitis  Mildly elevated sed rate (27) and CRP (2.5) with mild leukocytosis on presentation (13.8). No concern for acute change, given good collateral flow noted on CT. ED vascular surgery consulted, recommending no further intervention at the time.    HLD LDL above goal 144, risk factor for CVA.  -Continue home atorvastatin     Best Practice: Diet: heart healthy  VTE: Heparin IVF: none  Code: Full  Signature: January 2022, MD Internal Medicine Resident, PGY-2 [facility]  Pager: [phone] After 5pm on weekdays and 1pm on weekends: On Call pager [phone]

## 2020-12-03 NOTE — Progress Notes (Signed)
STROKE TEAM PROGRESS NOTE   INTERVAL HISTORY No acute events overnight.   Visitor in the room today. Non complaint with ASA/Plavix at home. No new events. Waiting on echo.  Vitals:   12/02/20 2316 12/03/20 0352 12/03/20 0827 12/03/20 1218  BP: (!) 189/95 (!) 194/93 (!) 149/72 (!) 153/83  Pulse: 67 62 66 72  Resp: 20 16 16 14   Temp: 98.4 F (36.9 C) 98.7 F (37.1 C) 98.2 F (36.8 C) 98.4 F (36.9 C)  TempSrc: Oral Oral Oral Oral  SpO2: 98% 99% 97% 99%  Weight:  81.7 kg     CBC:  Recent Labs  Lab 12/01/20 0041 12/01/20 0134 12/02/20 0414  WBC 13.8*  --  10.0  NEUTROABS 9.0*  --   --   HGB 16.5 17.0 15.0  HCT 49.5 50.0 44.0  MCV 91.5  --  91.5  PLT 354  --  304   Basic Metabolic Panel:  Recent Labs  Lab 12/02/20 0414 12/03/20 0054  NA 138 138  K 3.3* 3.4*  CL 102 103  CO2 22 24  GLUCOSE 74 100*  BUN 21 15  CREATININE 1.34* 1.16  CALCIUM 9.2 9.1  MG  --  2.0   Lipid Panel:  Recent Labs  Lab 12/02/20 0414  CHOL 197  TRIG 104  HDL 32*  CHOLHDL 6.2  VLDL 21  LDLCALC 02/01/21*   HgbA1c:  Recent Labs  Lab 12/02/20 0414  HGBA1C 5.6   Urine Drug Screen:  Recent Labs  Lab 12/01/20 1401  LABOPIA NONE DETECTED  COCAINSCRNUR NONE DETECTED  LABBENZ NONE DETECTED  AMPHETMU NONE DETECTED  THCU POSITIVE*  LABBARB NONE DETECTED    Alcohol Level No results for input(s): ETH in the last 168 hours.  IMAGING past 24 hours ECHOCARDIOGRAM COMPLETE  Result Date: 12/03/2020    ECHOCARDIOGRAM REPORT   Patient Name:   Alec Sanchez Date of Exam: 12/03/2020 Medical Rec #:  02/02/2021    Height:       67.0 in Accession #:    [id number]   Weight:       180.1 lb Date of Birth:  [birth year]    BSA:          1.934 m Patient Age:    65 years     BP:           194/93 mmHg Patient Gender: M            HR:           79 bpm. Exam Location:  Inpatient Procedure: 2D Echo, Cardiac Doppler and Color Doppler Indications:    Stroke I63.9  History:        Patient has prior history of  Echocardiogram examinations, most                 recent 02/01/2020. Stroke; Risk Factors:Dyslipidemia and                 Hypertension.  Sonographer:    14/08/2019 RDCS Referring Phys: [id number] ERIK C HOFFMAN IMPRESSIONS  1. Left ventricular ejection fraction, by estimation, is 60 to 65%. The left ventricle has normal function. The left ventricle has no regional wall motion abnormalities. There is moderate left ventricular hypertrophy. Left ventricular diastolic parameters are consistent with Grade I diastolic dysfunction (impaired relaxation).  2. Right ventricular systolic function is normal. The right ventricular size is normal.  3. The mitral valve is normal in structure. Trivial mitral valve regurgitation. No evidence of mitral  stenosis. Moderate mitral annular calcification.  4. The aortic valve is calcified. Aortic valve regurgitation is not visualized. Mild to moderate aortic valve sclerosis/calcification is present, without any evidence of aortic stenosis.  5. The inferior vena cava is normal in size with greater than 50% respiratory variability, suggesting right atrial pressure of 3 mmHg. Conclusion(s)/Recommendation(s): No intracardiac source of embolism detected on this transthoracic study. A transesophageal echocardiogram is recommended to exclude cardiac source of embolism if clinically indicated. FINDINGS  Left Ventricle: Left ventricular ejection fraction, by estimation, is 60 to 65%. The left ventricle has normal function. The left ventricle has no regional wall motion abnormalities. The left ventricular internal cavity size was normal in size. There is  moderate left ventricular hypertrophy. Left ventricular diastolic parameters are consistent with Grade I diastolic dysfunction (impaired relaxation). Right Ventricle: The right ventricular size is normal. No increase in right ventricular wall thickness. Right ventricular systolic function is normal. Left Atrium: Left atrial size was normal in size.  Right Atrium: Right atrial size was normal in size. Pericardium: There is no evidence of pericardial effusion. Mitral Valve: The mitral valve is normal in structure. Moderate mitral annular calcification. Trivial mitral valve regurgitation. No evidence of mitral valve stenosis. Tricuspid Valve: The tricuspid valve is normal in structure. Tricuspid valve regurgitation is not demonstrated. No evidence of tricuspid stenosis. Aortic Valve: The aortic valve is calcified. Aortic valve regurgitation is not visualized. Mild to moderate aortic valve sclerosis/calcification is present, without any evidence of aortic stenosis. Pulmonic Valve: The pulmonic valve was normal in structure. Pulmonic valve regurgitation is not visualized. No evidence of pulmonic stenosis. Aorta: The aortic root is normal in size and structure. Venous: The inferior vena cava is normal in size with greater than 50% respiratory variability, suggesting right atrial pressure of 3 mmHg. IAS/Shunts: No atrial level shunt detected by color flow Doppler.  LEFT VENTRICLE PLAX 2D LVIDd:         3.40 cm   Diastology LVIDs:         2.40 cm   LV e' medial:    3.97 cm/s LV PW:         1.30 cm   LV E/e' medial:  13.0 LV IVS:        1.40 cm   LV e' lateral:   4.78 cm/s LVOT diam:     2.00 cm   LV E/e' lateral: 10.8 LV SV:         56 LV SV Index:   29 LVOT Area:     3.14 cm  RIGHT VENTRICLE RV S prime:     19.90 cm/s TAPSE (M-mode): 1.7 cm LEFT ATRIUM             Index        RIGHT ATRIUM          Index LA diam:        3.20 cm 1.65 cm/m   RA Area:     8.33 cm LA Vol (A2C):   30.3 ml 15.67 ml/m  RA Volume:   14.10 ml 7.29 ml/m LA Vol (A4C):   25.3 ml 13.08 ml/m LA Biplane Vol: 27.9 ml 14.42 ml/m  AORTIC VALVE LVOT Vmax:   134.00 cm/s LVOT Vmean:  79.000 cm/s LVOT VTI:    0.179 m  AORTA Ao Asc diam: 3.40 cm MITRAL VALVE MV Area (PHT): 2.24 cm    SHUNTS MV Decel Time: 338 msec    Systemic VTI:  0.18 m MV E velocity: 51.80  cm/s  Systemic Diam: 2.00 cm MV A  velocity: 74.10 cm/s MV E/A ratio:  0.70 Donato Schultz MD Electronically signed by Donato Schultz MD Signature Date/Time: 12/03/2020/3:17:59 PM    Final     PHYSICAL EXAM Neurological Examination Mental Status: Alert and oriented x4, speech clear and fluent without errors or dysarthria.  Cranial Nerves: II: Temporal visual fields intact with no extinction to DSS.   III,IV, VI: EOMI. No nystagmus. No ptosis.  V,VII: Smile symmetric, facial temp sensation equal bilaterally VIII: hearing intact to voice IX,X: No hypophonia XI: Symmetric XII: Midline tongue extension Motor: Right :  Upper extremity   5/5                                      Left:     Upper extremity   5/5             Lower extremity   5/5                                                  Lower extremity   5/5 Sensory: Temp intact throughout, bilaterally. No extinction to DSS.  Cerebellar: No ataxia  Gait: Deferred    ASSESSMENT/PLAN Alec Sanchez is an 65 y.o. male with a PMHx of stroke, carotid artery occlusion, carotid artery stenosis s/p Right TCAR on 04/12/2020 by Dr. Randie Heinz for asymptomatic high grade ICA stenosisHLD and HTN who presented via EMS to the [facility] ED on Friday afternoon after family called for concerns of a stroke. He also has had right flank pain for a couple of days and had been evaluated at urgent care for this on Thursday afternoon; urinalysis was positive for hematuria; he was diagnosed with renal colic and discharged home. Upon arrival to home yesterday, patient was reportedly noted to have some slurring of his speech and right-sided facial droop. Daughter later mentioned to IM resident that the patient's first neurological deficits were noticed on Tuesday, at which time he had drooping of face, slurred speech worse than his baseline, AMS, loss of bowel and bladder continence, slowing of movement and speech, and difficulty holding things in his right hand. Patient later endorsed to IM resident that he had fallen twice on  Tuesday and Wednesday and injured his back during the latter fall. On Friday, family had tried to contact patient by phone, but he did not answer, so EMS was called. On arrival to the ED, he was A&Ox3, appeared generally weak and reported not being able to sleep due to flank pain for a couple of days. Exam in the ED revealed that he had generalized weakness in all extremities and his speech seemed slurred. He was noted to be hypertensive, and admitted that he had not taken any of his medications on the day of presentation.  SBPs have been in the high 160's to 202 this admission.    Stroke: 3 small acute ischemic infarctions involving the right anterior genu of the corpus callosum, periventricular white matter of the right posterior corona radiata, and right lentiform nucleus which are likely due to SVD   MRI brain: Three distinct 8 mm acute ischemic infarcts involving the right anterior genu of the corpus callosum, periventricular white matter of the right posterior corona radiata, and right lentiform  nucleus . No associated hemorrhage or mass effect. Scattered T2/FLAIR signal abnormality involving the subcortical aspects of the right frontal and parietal regions as above, new as compared to prior MRI from 01/31/2020. 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 Also noted is underlying age-related cerebral atrophy with advanced chronic microvascular ischemic disease, progressed from prior. 3. CTA of head and neck: No emergent large vessel occlusion. Interval right carotid stenting without residual stenosis. 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 Progressive, severe proximal left vertebral artery stenosis. Unchanged 60% proximal left ICA stenosis. Unchanged mild to moderate bilateral intracranial ICA stenoses and mild left M1 stenosis. Aortic Atherosclerosisode Stroke CT head  2D Echo PENDING (should be done today) LDL  144 HgbA1c 5.6 VTE prophylaxis - recommended, per primary team     Diet   Diet Heart Room service appropriate? Yes; Fluid consistency: Thin   Prescribed but not taking ASA 325mg  and Plavix 75mg  as prescribed December 2021 after last stroke  Recommend continue ASA 325 mg and Plavix 75mg  with measures to improve compliance  Therapy recommendations:  CIR seen by OT and rehab coordinator today. Disposition:  TBD  Uncontrolled Hypertension Home meds:  Not taking his medications per his admission  Hypertensive to 202 since admission, currently stable Permissive hypertension (OK if < 220/120) but gradually normalize in 5-7 days Long-term BP goal normotensive  Hyperlipidemia Home meds: lipitor 80mg  LDL 144, goal < 70  High intensity statin : Keep home lipitor  as he has not been complaint with taking it  Continue statin at discharge       Non-compliance with medication regimen Patient reports noncompliance in a ongoing way Barriers discussed with daughter and she will assist with medication management at home moving forward Home health RN for medication management at home should be considered if possible ?depression/grief  may be contributing to lack of caring about taking care of himself (grieving 3 recent deaths in the family)  Other Stroke Risk Factors Advanced Age >/= 2  History of strokes, 2021:  L basal ganglia / corona radiata infarct. Punctate L periatrial white matter infarct. Moderate small vessel disease w/ chronic lacunes and microhemorrhages. Details not available for other strokes.   Other Active Problems   Hospital day # 2   Total of 30 mins spent reviewing chart, discussion with patient and family on prognosis, Dx and plan. Discussed case with patient's nurse. Reviewed Imaging personally.     Marsha Gundlach,MD   To contact Stroke Continuity provider, please refer to . After hours, contact General Neurology

## 2020-12-03 NOTE — Progress Notes (Signed)
  Echocardiogram 2D Echocardiogram has been performed.  [name] 12/03/2020, 2:52 PM

## 2020-12-03 NOTE — Progress Notes (Signed)
Inpatient Rehab Admissions:  Inpatient Rehab Consult received.  I met with patient and brother at the bedside for rehabilitation assessment and to discuss goals and expectations of an inpatient rehab admission.  Pt acknowledged understanding of CIR goals and expectations. Pt interested in pursuing CIR. Pt called daughter, Jonelle Sidle while [facility] in room. Explained CIR goals and expectations to daughter. She acknowledged understanding. She is supportive of pt pursuing CIR. She informed AC that she would be able to provide intermittent support/supervision to pt after discharge. Will continue to follow.   Signed: Gayland Curry, [place], [place] Admissions Coordinator [phone]

## 2020-12-04 DIAGNOSIS — I639 Cerebral infarction, unspecified: Secondary | ICD-10-CM | POA: Diagnosis not present

## 2020-12-04 LAB — CBC
HCT: 43.8 % (ref 39.0–52.0)
Hemoglobin: 14.4 g/dL (ref 13.0–17.0)
MCH: 30.1 pg (ref 26.0–34.0)
MCHC: 32.9 g/dL (ref 30.0–36.0)
MCV: 91.4 fL (ref 80.0–100.0)
Platelets: 342 10*3/uL (ref 150–400)
RBC: 4.79 MIL/uL (ref 4.22–5.81)
RDW: 14.2 % (ref 11.5–15.5)
WBC: 8.3 10*3/uL (ref 4.0–10.5)
nRBC: 0 % (ref 0.0–0.2)

## 2020-12-04 MED ORDER — LACTATED RINGERS IV BOLUS
1000.0000 mL | Freq: Once | INTRAVENOUS | Status: AC
  Administered 2020-12-04: 1000 mL via INTRAVENOUS

## 2020-12-04 NOTE — Progress Notes (Signed)
Occupational Therapy Treatment Patient Details Name: Alec Sanchez MRN: [id number] DOB: 12/23/1955 Today's Date: 12/04/2020   History of present illness The pt is a 65 yo male presenting 10/6 with c/o drooping of face, slurred speech, AMS, weakness, and incontinence x2 days with reports of x2 falls. 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 PMH includes: previous CVA with R-sided deficits, HTN, and HLD.   OT comments  Pt progressing towards OT goals and able to complete aspects of LB dressing with Min A (to don socks) though requires intermittent assist to correct LOB sitting EOB. Pt continues to require hands-on assist throughout OOB tasks due to balance deficits that worsen with fatigue. Pt benefits from cues to attend to ADL tasks though improving awareness of his current deficits. Continue to recommend intensive, comprehensive therapies with CIR as pt is significantly below baseline.    Recommendations for follow up therapy are one component of a multi-disciplinary discharge planning process, led by the attending physician.  Recommendations may be updated based on patient status, additional functional criteria and insurance authorization.    Follow Up Recommendations  CIR    Equipment Recommendations  Tub/shower seat    Recommendations for Other Services      Precautions / Restrictions Precautions Precautions: Fall Precaution Comments: x2 falls in day PTA Restrictions Weight Bearing Restrictions: No       Mobility Bed Mobility Overal bed mobility: Needs Assistance Bed Mobility: Supine to Sit     Supine to sit: Mod assist;HOB elevated     General bed mobility comments: tactile cues to initiate moving LEs to EOB, handheld assist to lift trunk and tactile cues to use bedrail to assist with task    Transfers Overall transfer level: Needs assistance Equipment used: Rolling walker (2 wheeled) Transfers: Sit to/from Stand Sit to Stand:  Min assist;+2 physical assistance;+2 safety/equipment;From elevated surface         General transfer comment: Min A x 2 for initial sit to stand from bedside with steadying assist needed to acheive upright posture. able to progress to min guard pushing up from recliner armrests. cues needed for hand placement    Balance Overall balance assessment: Needs assistance;History of Falls Sitting-balance support: Feet supported;Single extremity supported Sitting balance-Leahy Scale: Poor Sitting balance - Comments: improved awareness of leans/LOB, typically to R/posterior. min guard to Min A to correct during dynamic tasks, able to sit statically without support Postural control: Posterior lean;Right lateral lean Standing balance support: Bilateral upper extremity supported Standing balance-Leahy Scale: Poor Standing balance comment: dependent on BUE support and physical assist                           ADL either performed or assessed with clinical judgement   ADL Overall ADL's : Needs assistance/impaired                     Lower Body Dressing: Sitting/lateral leans;Minimal assistance Lower Body Dressing Details (indicate cue type and reason): Min A to don socks via figure four position sitting EOB, assist to cross LEs and intermittent cues/min guard to correct LOB sitting EOB             Functional mobility during ADLs: Minimal assistance;+2 for safety/equipment;Rolling walker;Cueing for sequencing;Cueing for safety General ADL Comments: Pt with improving awareness of deficits though still noted with sitting/standing balance impairments, cues needed for body mechanics to decrease fall risk during tasks.  Vision   Vision Assessment?: No apparent visual deficits   Perception     Praxis      Cognition Arousal/Alertness: Awake/alert Behavior During Therapy: WFL for tasks assessed/performed Overall Cognitive Status: No family/caregiver present to determine  baseline cognitive functioning Area of Impairment: Safety/judgement;Awareness;Problem solving;Following commands;Attention                   Current Attention Level: Selective   Following Commands: Follows one step commands with increased time;Follows multi-step commands inconsistently;Follows multi-step commands with increased time Safety/Judgement: Decreased awareness of safety;Decreased awareness of deficits Awareness: Emergent Problem Solving: Slow processing;Decreased initiation;Difficulty sequencing;Requires verbal cues General Comments: improving insight into deficits but benefits from cues for problem solving or correction to decrease fall risk; attention deficits with cues to attend/initiate tasks at times. pleasant and joking, agreeable for participation        Exercises     Shoulder Instructions       General Comments      Pertinent Vitals/ Pain       Pain Assessment: No/denies pain  Home Living                                          Prior Functioning/Environment              Frequency  Min 2X/week        Progress Toward Goals  OT Goals(current goals can now be found in the care plan section)  Progress towards OT goals: Progressing toward goals  Acute Rehab OT Goals Patient Stated Goal: get better OT Goal Formulation: With patient Time For Goal Achievement: 12/16/20 Potential to Achieve Goals: Fair ADL Goals Pt Will Perform Grooming: with min guard assist;sitting Pt Will Perform Upper Body Bathing: with min guard assist;sitting Pt Will Perform Upper Body Dressing: with min guard assist;sitting Pt Will Transfer to Toilet: with min assist;stand pivot transfer;bedside commode Pt Will Perform Toileting - Clothing Manipulation and hygiene: with min assist;sit to/from stand Pt/caregiver will Perform Home Exercise Program: Increased strength;Both right and left upper extremity;With theraband;With minimal assist;With written HEP  provided  Plan Discharge plan remains appropriate    Co-evaluation                 AM-PAC OT "6 Clicks" Daily Activity     Outcome Measure   Help from another person eating meals?: A Little Help from another person taking care of personal grooming?: A Little Help from another person toileting, which includes using toliet, bedpan, or urinal?: A Lot Help from another person bathing (including washing, rinsing, drying)?: A Lot Help from another person to put on and taking off regular upper body clothing?: A Little Help from another person to put on and taking off regular lower body clothing?: A Lot 6 Click Score: 15    End of Session Equipment Utilized During Treatment: Gait belt;Rolling walker  OT Visit Diagnosis: Unsteadiness on feet (R26.81);Other abnormalities of gait and mobility (R26.89);Repeated falls (R29.6);Muscle weakness (generalized) (M62.81);Ataxia, unspecified (R27.0);Other symptoms and signs involving cognitive function   Activity Tolerance Patient tolerated treatment well   Patient Left Other (comment) (with PT to complete session)   Nurse Communication          Time: 0867-6195 OT Time Calculation (min): 26 min  Charges: OT General Charges $OT Visit: 1 Visit OT Treatments $Self Care/Home Management : 8-22 mins $Therapeutic Activity: 8-22 mins  Raynelle Fanning  B, OTR/L Acute Rehab Services Office: [phone]   Lorre Munroe 12/04/2020, 10:27 AM

## 2020-12-04 NOTE — Discharge Summary (Signed)
Name: Alec Sanchez MRN: [id number] DOB: 1955-03-04 65 y.o. PCP: [facility]  Date of Admission: 12/01/2020 12:19 AM Date of Discharge:  12/06/20 Attending Physician: Dr. Cleda Daub  DISCHARGE DIAGNOSIS:  Primary Problem: Acute ischemic stroke [facility])   Hospital Problems: Principal Problem:   Acute ischemic stroke [facility]) Active Problems:   AKI (acute kidney injury) (HCC)    DISCHARGE MEDICATIONS:   Allergies as of 12/06/2020       Reactions   Trazodone Nausea And Vomiting        Medication List     STOP taking these medications    lisinopril 10 MG tablet Commonly known as: ZESTRIL   naproxen 500 MG tablet Commonly known as: NAPROSYN       TAKE these medications    acetaminophen 325 MG tablet Commonly known as: TYLENOL Take 2 tablets (650 mg total) by mouth every 6 (six) hours as needed for mild pain (or Fever >/= 101).   amLODipine 10 MG tablet Commonly known as: NORVASC Take 10 mg by mouth every morning.   aspirin 81 MG EC tablet Take 1 tablet (81 mg total) by mouth daily. Swallow whole. What changed: Another medication with the same name was removed. Continue taking this medication, and follow the directions you see here.   atorvastatin 80 MG tablet Commonly known as: LIPITOR Take 80 mg by mouth at bedtime.   chlorthalidone 25 MG tablet Commonly known as: HYGROTON Take 25 mg by mouth daily. For high blood pressure   Cholecalciferol 25 MCG (1000 UT) tablet Take 1,000 Units by mouth every morning.   clopidogrel 75 MG tablet Commonly known as: PLAVIX Take 1 tablet (75 mg total) by mouth daily for 21 days.   ferrous sulfate 325 (65 FE) MG tablet Take 325 mg by mouth every Monday, Wednesday, and Friday.   lidocaine 5 % Commonly known as: LIDODERM Place 1 patch onto the skin daily. Remove & Discard patch within 12 hours or as directed by MD   methocarbamol 500 MG tablet Commonly known as: ROBAXIN Take 1 tablet (500 mg total) by  mouth every 8 (eight) hours as needed for muscle spasms.   nicotine 21 mg/24hr patch Commonly known as: NICODERM CQ - dosed in mg/24 hours Place 21 mg onto the skin daily.   nicotine polacrilex 2 MG gum Commonly known as: NICORETTE Take 2 mg by mouth See admin instructions. Chew one piece (2 mg) in mouth as directed for smoking cessation: Weeks 1-6 - chew one piece of gum (2 mg) every 1-2 hours (max 24 pieces/day); to increase chances of quitting, chew at least 9 pieces/day during the first 6 weeks; weeks 7-9 - chew one piece of gum (2 mg) every 2-4 hours (max 24 pieces/day); weeks 10-12 - chew one piece of gum (2 mg) every 4-8 hours (max 24 pieces/day)   oxyCODONE-acetaminophen 5-325 MG tablet Commonly known as: PERCOCET/ROXICET Take 1-2 tablets by mouth every 4 (four) hours as needed for moderate pain.   PARoxetine 40 MG tablet Commonly known as: PAXIL Take 20 mg by mouth every morning. For mental health   prazosin 1 MG capsule Commonly known as: MINIPRESS Take 1 capsule (1 mg total) by mouth at bedtime. What changed: additional instructions   sildenafil 100 MG tablet Commonly known as: VIAGRA Take 100 mg by mouth daily as needed for erectile dysfunction (do not take within 6 hours of taking prazosin).               Durable  Medical Equipment  (From admission, onward)           Start     Ordered   12/06/20 1050  DME Tub Bench  Once        12/06/20 1049            DISPOSITION AND FOLLOW-UP:  Mr.Alec Sanchez was discharged from [facility] in stable condition. At the hospital follow up visit please address:  Follow-up Recommendations: Consults: none  Labs: BMP to check for Cr in 1 week   Studies: Sleep Smart Study (Dr Pearlean Brownie)   Medications: ASA and Plavix for 3 weeks, followed by aspirin alone. Continue BP medications amlodipine and chlorthalidone, and STOP lisinopril in setting of AKI. Can check BMP and reassess BP meds.   Follow-up  Appointments:  Follow-up Information     [facility] Schedule an appointment as soon as possible for a visit in 1 week(s).   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 CTA head/neck w/o acute changes. Pt advised on risk factor modifications. Continued ASA, plavix, and atorvastatin, and BP meds (amlodipine, chlorthalidone, and lisinopril). Monitored on tele, frequent neuro checks, received PT/OT. No intracardiac source of embolism noted on echo (LVEF 60-65%). L ventricle fxn normal, grade 1 DD noted detected on this transthoracic study. Continues to make progress with PT. Sleep Smart Study to possibly be done by neurology as is at risk for sleep apnea.  Final recommendations by neurology includes DAPT with ASA and Plavix for 3 weeks followed by ASA alone. Counseled on medication adherence and risk factor modification, including quitting smoking. Pt d/c to CIR.   Resolving AKI, likely multifactorial Inc in Cr to 1.71, baseline1.2. Lisinopril d/c and Cr return to baseline. Continued amlodipine and chlorthalidone.  - IV fluid bolus  - Continue to monitor BMP   HTN Stable sbp 160s  -Continue amlodipine and chlorthalidone, and d/c lisinopril after inc in Cr to 1.7, return back to baseline the next day. Can reassess Cr and determine appropriate regimen for patient.    Back pain Likely in the setting of recent falls. CT negative, no worsening sensation.  Likely MSK strain in the setting falls. -Tylenol prn -Lidocaine patch and Robaxin   IMA vasculitis  Mildly elevated sed rate (27) and CRP (2.5) with mild  leukocytosis on presentation (13.8). No concern for acute change. Vascular surgery recommending no further intervention at the time.    HLD LDL above goal 144, risk factor for CVA.  -Continue home atorvastatin    DISCHARGE INSTRUCTIONS:   Discharge Instructions     Call MD for:  difficulty breathing, headache or visual disturbances   Complete by: As directed    Call MD for:  extreme fatigue   Complete by: As directed    Call MD for:  hives   Complete by: As directed    Call MD for:  persistant dizziness or light-headedness   Complete by: As directed    Call MD for:  persistant nausea and vomiting   Complete by: As directed    Call MD for:  redness, tenderness, or signs of infection (pain, swelling, redness, odor or green/yellow discharge around  incision site)   Complete by: As directed    Call MD for:  severe uncontrolled pain   Complete by: As directed    Call MD for:  temperature >100.4   Complete by: As directed    Diet - low sodium heart healthy   Complete by: As directed    Increase activity slowly   Complete by: As directed        SUBJECTIVE:  No acute overnight events. Patient was seen at bedside during rounds this morning. Pt reports feeling well this AM. Pt denies chest pain, SOB, headaches, and confusion. No other complains or concerns at this time.   He is prepared to continue rehab at [facility], and is looking forward to it.   Discharge Vitals:   BP (!) 163/94 (BP Location: Left Arm)   Pulse 84   Temp 98 F (36.7 C) (Oral)   Resp 19   Wt 84.1 kg   SpO2 97%   BMI 29.04 kg/m   OBJECTIVE:  Constitutional: alert, well-appearing, in no acute distress HENT: normocephalic, atraumatic, mucous membranes moist Cardiovascular: regular rate and rhythm, no m/r/g Pulmonary/Chest: normal work of breathing on room air, lungs CTAB Abdominal: soft, non-tender to palpation, non-distended MSK: normal bulk and tone Neurological: alert & oriented x 3, strength exam 4/5 in  bilateral upper and lower extremities. Excellent grip strength. No tongue deviation.  Skin: warm and dry Psych: normal behavior, normal affect  Pertinent Labs, Studies, and Procedures:  CBC Latest Ref Rng & Units 12/04/2020 12/02/2020 12/01/2020  WBC 4.0 - 10.5 K/uL 8.3 10.0 -  Hemoglobin 13.0 - 17.0 g/dL 26.7 12.4 58.0  Hematocrit 39.0 - 52.0 % 43.8 44.0 50.0  Platelets 150 - 400 K/uL 342 304 -    CMP Latest Ref Rng & Units 12/06/2020 12/05/2020 12/03/2020  Glucose 70 - 99 mg/dL 92 998(P) 94  BUN 8 - 23 mg/dL 21 19 18   Creatinine 0.61 - 1.24 mg/dL ) 3.82(N) 0.53(Z)  Sodium 135 - 145 mmol/L 134(L) 136 135  Potassium 3.5 - 5.1 mmol/L 4.4 3.2(L) 4.1  Chloride 98 - 111 mmol/L 104 102 102  CO2 22 - 32 mmol/L 21(L) 23 23  Calcium 8.9 - 10.3 mg/dL 9.5 9.6 9.1  Total Protein 6.5 - 8.1 g/dL - - -  Total Bilirubin 0.3 - 1.2 mg/dL - - -  Alkaline Phos 38 - 126 U/L - - -  AST 15 - 41 U/L - - -  ALT 0 - 44 U/L - - -    CT ANGIO HEAD NECK W WO CM  Result Date: 12/01/2020 CLINICAL DATA:  Neuro deficit, acute, stroke suspected. Slurred speech. EXAM: CT ANGIOGRAPHY HEAD AND NECK TECHNIQUE: Multidetector CT imaging of the head and neck was performed using the standard protocol during bolus administration of intravenous contrast. Multiplanar CT image reconstructions and MIPs were obtained to evaluate the vascular anatomy. Carotid stenosis measurements (when applicable) are obtained utilizing NASCET criteria, using the distal internal carotid diameter as the denominator. CONTRAST:  01/31/2021 OMNIPAQUE IOHEXOL 350 MG/ML SOLN COMPARISON:  Head and neck CTA 01/31/2020 FINDINGS: CTA NECK FINDINGS Aortic arch: Standard 3 vessel aortic arch with mixed calcified and soft plaque and with detailed evaluation of the aorta performed on separate chest CTA. Patent brachiocephalic and subclavian arteries without significant stenosis. Right carotid system: Patent without evidence of a significant stenosis or dissection.  Interval carotid stenting extending from the distal common into the proximal internal carotid arteries. Left carotid system: Patent with soft plaque in  the mid common carotid artery not resulting in significant stenosis. Mixed calcified and soft, mildly ulcerated plaque in the left carotid bulb results in approximately 60% stenosis, unchanged. Vertebral arteries: The left vertebral artery is patent and dominant with predominantly soft plaque in the proximal V1 segment resulting in progressive, severe stenosis (series 10, image 106). There is unchanged occlusion of the right vertebral artery at its origin with reconstitution of small distal V2 and V3 segments. A severe stenosis is again noted of the distal right V3 segment. Skeleton: Focally advanced cervical disc degeneration at C5-6. Moderate left facet arthrosis at C4-5. Other neck: No evidence of cervical lymphadenopathy or mass. Upper chest: Reported separately. Review of the MIP images confirms the above findings CTA HEAD FINDINGS Anterior circulation: The internal carotid arteries are patent from skull base to carotid termini with similar appearance of mild left cavernous and mild to moderate bilateral paraclinoid stenoses. ACAs and MCAs are patent without evidence of a proximal branch occlusion or significant proximal stenosis. There is an unchanged mild left M1 stenosis. Mild-to-moderate branch vessel irregularity is noted bilaterally. No aneurysm is identified. Posterior circulation: The intracranial vertebral arteries are patent to the basilar. The left V4 segment is mildly irregular without significant stenosis. The right V4 segment is small with superimposed moderate to severe stenoses. Patent PICA, AICA, and SCA origins are identified bilaterally. The basilar artery is widely patent. Posterior communicating arteries are diminutive or absent. The PCAs are patent with moderate branch vessel irregularity but no flow limiting proximal stenosis. No aneurysm  is identified. Venous sinuses: Not well evaluated due to arterial contrast timing. Anatomic variants: Dominant left vertebral artery. Review of the MIP images confirms the above findings IMPRESSION: 1. No emergent large vessel occlusion. 2. Interval right carotid stenting without residual stenosis. 3. Unchanged 60% proximal left ICA stenosis. 4. 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 5. Progressive, severe proximal left vertebral artery stenosis. 6. Unchanged mild to moderate bilateral intracranial ICA stenoses and mild left M1 stenosis. 7. Aortic Atherosclerosis (ICD10-I70.0). Electronically Signed   By: Sebastian Ache M.D.   On: 12/01/2020 16:32   CT HEAD WO CONTRAST  Result Date: 12/01/2020 CLINICAL DATA:  Neuro deficit, generalized weakness EXAM: CT HEAD WITHOUT CONTRAST TECHNIQUE: Contiguous axial images were obtained from the base of the skull through the vertex without intravenous contrast. COMPARISON:  03/10/2020 FINDINGS: Brain: No evidence of acute infarction, hemorrhage, hydrocephalus, extra-axial collection or mass lesion/mass effect. Chronic atrophic and ischemic changes are noted stable in appearance from the prior exam. Vascular: No hyperdense vessel or unexpected calcification. Skull: Normal. Negative for fracture or focal lesion. Sinuses/Orbits: No acute finding. Other: None. IMPRESSION: Chronic atrophic and ischemic changes stable from the prior exam. Electronically Signed   By: Alcide Clever M.D.   On: 12/01/2020 01:46   MR BRAIN WO CONTRAST  Result Date: 12/01/2020 CLINICAL DATA:  Follow-up examination for acute stroke. EXAM: MRI HEAD WITHOUT CONTRAST TECHNIQUE: Multiplanar, multiecho pulse sequences of the brain and surrounding structures were obtained without intravenous contrast. COMPARISON:  Prior CTA from earlier the same day. FINDINGS: Brain: Diffuse prominence of the CSF containing spaces compatible with generalized cerebral  atrophy. Patchy and confluent T2/FLAIR hyperintensity involving the periventricular and deep white matter both cerebral hemispheres most consistent with chronic small vessel ischemic disease, moderate to advanced in nature. Remote lacunar infarcts present at the left greater than right basal ganglia and thalami. Appearance is progressed from prior. Three distinct foci of restricted  diffusion all measuring approximately 8 mm each seen involving the right anterior genu of the corpus callosum (series 5, image 87), periventricular white matter of the right posterior corona radiata (series 5, image 84), and right lentiform nucleus (series 5, image 78). No associated hemorrhage or mass effect. No other diffusion abnormality to suggest acute or subacute ischemia. Additionally, there is patchy T2/FLAIR signal abnormality involving primarily the subcortical aspect of the posterior right frontoparietal region (series 11, image 20), with extension to involve the parasagittal right frontal region near the vertex (series 11, image 22). No associated diffusion abnormality or significant mass effect. Finding is nonspecific, but new as compared to prior MRI, and could reflect changes subacute ischemia, somewhat watershed in distribution, possibly related to previously identified severe right ICA stenosis. Possible PRES could also conceivably have this appearance. No evidence for acute intracranial hemorrhage. Multiple scattered punctate chronic micro hemorrhages noted, primarily clustered about the deep gray nuclei, likely related to chronic poorly controlled hypertension. No mass lesion, midline shift or mass effect. Mild ventricular prominence related global parenchymal volume loss of hydrocephalus. No extra-axial fluid collection. Pituitary gland suprasellar region normal. Midline structures intact. Vascular: Major intracranial vascular flow voids are maintained. Skull and upper cervical spine: Craniocervical junction within  normal limits. Bone marrow signal intensity within normal limits. No scalp soft tissue abnormality. Sinuses/Orbits: Globes orbital soft tissues within normal limits. Paranasal sinuses are largely clear. No significant mastoid effusion. Inner ear structures grossly normal. Other: None. IMPRESSION: 1. Three distinct 8 mm acute ischemic infarcts involving the right anterior genu of the corpus callosum, periventricular white matter of the right posterior corona radiata, and right lentiform nucleus as above. No associated hemorrhage or mass effect. 2. Scattered T2/FLAIR signal abnormality involving the subcortical aspects of the right frontal and parietal regions as above, new as compared to prior MRI from 01/31/2020. Finding is nonspecific, but favored to reflect changes of subacute ischemia, possibly related to previously identified severe right ICA stenosis given the somewhat watershed distribution. Possible PRES could also conceivably have this appearance, and could be considered in the correct clinical setting. 3. Underlying age-related cerebral atrophy with advanced chronic microvascular ischemic disease, progressed from prior. Electronically Signed   By: Rise Mu M.D.   On: 12/01/2020 22:44   CT Angio Chest/Abd/Pel for Dissection W and/or Wo Contrast  Result Date: 12/01/2020 CLINICAL DATA:  Right flank pain, abdominal pain, slurred speech EXAM: CT ANGIOGRAPHY CHEST, ABDOMEN AND PELVIS TECHNIQUE: Non-contrast CT of the chest was initially obtained. Multidetector CT imaging through the chest, abdomen and pelvis was performed using the standard protocol during bolus administration of intravenous contrast. Multiplanar reconstructed images and MIPs were obtained and reviewed to evaluate the vascular anatomy. CONTRAST:  OMNIPAQUE IOHEXOL 350 MG/ML SOLN COMPARISON:  None. FINDINGS: CTA CHEST FINDINGS Cardiovascular: The heart and great vessels are grossly unremarkable without pericardial effusion.  Mild atherosclerosis of the coronary vasculature. There is no evidence of thoracic aortic aneurysm or dissection. Moderate atherosclerosis of the aortic arch is noted, with mild narrowing at the origin of the innominate and left common carotid arteries. No filling defects or pulmonary emboli. Mediastinum/Nodes: No enlarged mediastinal, hilar, or axillary lymph nodes. Thyroid gland, trachea, and esophagus demonstrate no significant findings. Lungs/Pleura: No acute airspace disease, effusion, or pneumothorax. The central airways are patent. Musculoskeletal: No acute or destructive bony lesions. Cortical and trabecular thickening of the left first rib may reflect underlying Paget disease. Reconstructed images demonstrate no additional findings. Review of the MIP images confirms  the above findings. CTA ABDOMEN AND PELVIS FINDINGS VASCULAR Aorta: Normal caliber aorta without aneurysm, dissection, or significant stenosis. There is moderate multifocal atherosclerosis. At the level of the IMA, there is mild fat stranding along the ventral aspect of the aorta extending to the root of the IMA. This could reflect acute IMA thrombosis or underlying vasculitis. Celiac: Patent without evidence of aneurysm, dissection, vasculitis or significant stenosis. SMA: Patent without evidence of aneurysm, dissection, vasculitis or significant stenosis. Mild atherosclerosis. Renals: There are single bilateral renal arteries. On the left, there is mild to moderate atherosclerosis with approximately 50% stenosis in the distal left renal artery at the hilum. On the right, there is diffuse atheromatous plaque extending from the origin for approximately 2.5 cm in length, resulting in multifocal high-grade stenoses estimated 50-70%. No evidence of dissection aneurysm, or vasculitis. IMA: The IMA is occluded at its origin, with perivascular fat stranding suggesting possible acute IMA occlusion or vasculitis. No evidence of aneurysm or dissection.  Distal branches of the IMA enhance normally via collateral flow. Inflow: Patent without evidence of aneurysm, dissection, vasculitis or significant stenosis. Mild to moderate atherosclerosis within the right common iliac artery without high-grade stenosis. Veins: No obvious venous abnormality within the limitations of this arterial phase study. Review of the MIP images confirms the above findings. NON-VASCULAR Hepatobiliary: No focal liver abnormality is seen. No gallstones, gallbladder wall thickening, or biliary dilatation. Pancreas: Unremarkable. No pancreatic ductal dilatation or surrounding inflammatory changes. Spleen: Normal in size without focal abnormality. Adrenals/Urinary Tract: Mild right renal cortical atrophy may reflect sequela of underlying atherosclerosis in the right renal artery described above. Left kidney is unremarkable. No evidence of nephrolithiasis or obstructive uropathy within either kidney. The adrenals and bladder are grossly unremarkable. Minimal excreted contrast within the left ureter and bladder. Stomach/Bowel: No bowel obstruction or ileus. Normal appendix right lower quadrant. No bowel wall thickening or inflammatory change. Specifically, no evidence of distal colonic wall thickening to suggest underlying ischemia. Lymphatic: No pathologic adenopathy within the abdomen or pelvis. Reproductive: Prostate is unremarkable. Other: No free fluid or free gas.  No abdominal wall hernia. Musculoskeletal: No acute or destructive bony lesions. Reconstructed images demonstrate no additional findings. Review of the MIP images confirms the above findings. IMPRESSION: 1. 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 Findings could reflect acute IMA occlusion or underlying vasculitis. The distal branches of the IMA enhance via collateral flow, with no evidence of bowel ischemia within the distal colon.  2. No evidence of thoracoabdominal aortic aneurysm or dissection. 3. Multifocal atherosclerosis with significant stenoses at the origin of the innominate artery, left common carotid artery, and right renal artery as above. 4. No evidence of pulmonary embolus. 5. Otherwise no acute intrathoracic, intra-abdominal, or intrapelvic process. 6. 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 7.  Aortic Atherosclerosis (ICD10-I70.0). Critical Value/emergent results were called by telephone at the time of interpretation on 12/01/2020 at 4:41 pm to provider [facility], who verbally acknowledged these results. Electronically Signed   By: Sharlet Salina M.D.   On: 12/01/2020 16:45     Signed: [name], MD Internal Medicine Resident, PGY-1 [facility]  Pager: [phone] 10:57 AM, 12/06/2020

## 2020-12-04 NOTE — Discharge Instructions (Signed)
Alec Sanchez, you were admitted to the hospital because you had a stroke, which was detected on a brain MRI. We continued ASA, plavix, and atorvastatin, and BP meds (amlodipine, chlorthalidone, and lisinopril). We monitored you on telemetry, frequent neuro checks, and you received PT/OT. We took a picture of your heart to assess for an embolism from the heart, which showed that the heart was NOT a reason for the stroke. We also did a sleep study since you were at risk for sleep apnea.   PLEASE adhere to your medications as prescribed and take them DAILY without missing ANY doses to prevent another stroke.   TAKE Aspirin and Plavix (clopidogrel) for 3 week, and then continue taking Aspirin every single day. These medications will NOT cause you to go to the bathroom a lot.   In addition to continuing to take your medications as prescribed, please STOP smoking to help prevent another stroke. Also, choose more healthy foods rather than fried and salty foods, and limit any alcohol use. Get daily exercise between 30-60 minutes every day.   The back pain you're experiencing is likely from musculoskeletal pain and not from a bone fracture. Please continue to take tylenol as needed for this.

## 2020-12-04 NOTE — Progress Notes (Signed)
Physical Therapy Treatment Patient Details Name: Alec Sanchez MRN: [id number] DOB: 02/24/1956 Today's Date: 12/04/2020   History of Present Illness The pt is a 65 yo male presenting 10/6 with c/o drooping of face, slurred speech, AMS, weakness, and incontinence x2 days with reports of x2 falls. 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 PMH includes: previous CVA with R-sided deficits, HTN, and HLD.    PT Comments    Patient progressing with mobility and able to tolerate increased distance with ambulation as well as using walker more safely with cues.  Still with decreased L side coordination affecting balance and safety with decreased stride length and step height still at risk for falls.  Continues to be excellent candidate for CIR prior to home.  PT will follow until d/c.    Recommendations for follow up therapy are one component of a multi-disciplinary discharge planning process, led by the attending physician.  Recommendations may be updated based on patient status, additional functional criteria and insurance authorization.  Follow Up Recommendations  CIR     Equipment Recommendations  None recommended by PT    Recommendations for Other Services       Precautions / Restrictions Precautions Precautions: Fall Precaution Comments: x2 falls in day PTA Restrictions Weight Bearing Restrictions: No     Mobility  Bed Mobility Overal bed mobility: Needs Assistance Bed Mobility: Supine to Sit     Supine to sit: Mod assist;HOB elevated     General bed mobility comments: up at EOB with OT    Transfers Overall transfer level: Needs assistance Equipment used: Rolling walker (2 wheeled) Transfers: Sit to/from Stand Sit to Stand: Min assist;+2 safety/equipment         General transfer comment: assist to stand for balance, with RW  Ambulation/Gait Ambulation/Gait assistance: Min assist;+2 safety/equipment Gait Distance (Feet): 85 Feet  (x 2 & 20') Assistive device: Rolling walker (2 wheeled) Gait Pattern/deviations: Step-to pattern;Step-through pattern;Decreased stride length;Shuffle;Decreased dorsiflexion - left     General Gait Details: cues for proximity to walker and how to handle walker on turns, assist for balance and cues for stride length and step height; OT followed with chair and pt rested in hallway, walked around bed in room to recliner   Stairs             Wheelchair Mobility    Modified Rankin (Stroke Patients Only) Modified Rankin (Stroke Patients Only) Pre-Morbid Rankin Score: Slight disability Modified Rankin: Moderately severe disability     Balance Overall balance assessment: Needs assistance;History of Falls Sitting-balance support: Feet supported;Single extremity supported Sitting balance-Leahy Scale: Fair Sitting balance - Comments: static balance without support, min A for dynamic tasks Postural control: Posterior lean;Right lateral lean Standing balance support: Bilateral upper extremity supported Standing balance-Leahy Scale: Poor Standing balance comment: dependent on BUE support and physical assist                            Cognition Arousal/Alertness: Awake/alert Behavior During Therapy: WFL for tasks assessed/performed Overall Cognitive Status: No family/caregiver present to determine baseline cognitive functioning Area of Impairment: Memory;Problem solving;Safety/judgement                   Current Attention Level: Selective Memory: Decreased short-term memory Following Commands: Follows one step commands consistently;Follows one step commands with increased time;Follows multi-step commands with increased time Safety/Judgement: Decreased awareness of deficits Awareness: Emergent Problem Solving: Slow processing;Requires verbal cues  General Comments: improving insight into deficits but benefits from cues for problem solving or correction to decrease  fall risk; attention deficits with cues to attend/initiate tasks at times. pleasant and joking, agreeable for participation      Exercises Other Exercises Other Exercises: coordination task seated tapping alternating feet to bottle on floor in sitting, increased time for L LE    General Comments General comments (skin integrity, edema, etc.): VSS with mobility      Pertinent Vitals/Pain Pain Assessment: No/denies pain    Home Living                      Prior Function            PT Goals (current goals can now be found in the care plan section) Acute Rehab PT Goals Patient Stated Goal: get better Progress towards PT goals: Progressing toward goals    Frequency    Min 4X/week      PT Plan Current plan remains appropriate    Co-evaluation              AM-PAC PT "6 Clicks" Mobility   Outcome Measure  Help needed turning from your back to your side while in a flat bed without using bedrails?: A Little Help needed moving from lying on your back to sitting on the side of a flat bed without using bedrails?: A Little Help needed moving to and from a bed to a chair (including a wheelchair)?: A Little Help needed standing up from a chair using your arms (e.g., wheelchair or bedside chair)?: A Little Help needed to walk in hospital room?: A Lot Help needed climbing 3-5 steps with a railing? : Total 6 Click Score: 15    End of Session Equipment Utilized During Treatment: Gait belt Activity Tolerance: Patient tolerated treatment well Patient left: in chair;with call bell/phone within reach;with chair alarm set   PT Visit Diagnosis: Other abnormalities of gait and mobility (R26.89);Muscle weakness (generalized) (M62.81);Difficulty in walking, not elsewhere classified (R26.2)     Time: 5638-7564 PT Time Calculation (min) (ACUTE ONLY): 26 min  Charges:  $Gait Training: 8-22 mins $Therapeutic Activity: 8-22 mins                     Sheran Lawless, PT Acute  Rehabilitation Services Pager:[phone] Office:[phone] 12/04/2020    Elray Mcgregor 12/04/2020, 11:05 AM

## 2020-12-04 NOTE — Progress Notes (Signed)
Inpatient Rehab Admissions Coordinator:  Saw pt at bedside and daughter was present briefly as well. Informed them that insurance authorization has begun. Will continue to follow.   Wolfgang Phoenix, MS, CCC-SLP Admissions Coordinator [phone]

## 2020-12-04 NOTE — Progress Notes (Addendum)
   Subjective: No acute overnight events. Patient was seen at bedside during rounds this morning. Pt reports feeling well this AM. Pt denies chest pain, SOB, headaches, and confusion. He complains of an upset stomach, relieved after a bowel movement.   He is aware that we are waiting for insurance authorization for CIR placement. No other complains or concerns at this time.     Objective:  Vital signs in last 24 hours: Vitals:   12/03/20 2343 12/04/20 0354 12/04/20 0801 12/04/20 1130  BP: (!) 156/78 (!) 169/82 (!) 157/81 136/81  Pulse: 71 74 73 73  Resp: 16 17 17 15   Temp: 98.6 F (37 C) 98 F (36.7 C) 97.9 F (36.6 C) 98.2 F (36.8 C)  TempSrc: Oral Oral Oral Oral  SpO2: 97% 99% 96% 97%  Weight:  81.7 kg     Constitutional: alert, well-appearing, in no acute distress HENT: normocephalic, atraumatic, mucous membranes moist Cardiovascular: regular rate and rhythm, no m/r/g Pulmonary/Chest: normal work of breathing on room air, lungs CTAB Abdominal: soft, non-tender to palpation, non-distended MSK: normal bulk and tone Neurological: alert & oriented x 3, strength exam 4/5 in bilateral upper and lower extremities. Excellent grip strength.  Skin: warm and dry Psych: normal behavior, normal affect   Assessment/Plan:  Principal Problem:   Acute ischemic stroke (HCC) Active Problems:   AKI (acute kidney injury) (HCC)  Alec Sanchez is a 65 y.o. with pertinent PMH of HTN, HLD, prior stroke who presents with falls back pain and admitted for AKI and acute cerebral infarction.   Acute cerebral infarction History of cerebral infarctions, most recent December 2021 with residual right-sided weakness Neg CT head, MRI brain with acute ischemic infarcts consistent with hypertensive microangiopathy. CTA head/neck w/o acute changes. Pt advised on risk factor modifications. No intracardiac source of embolism noted on echo (LVEF 60-65%). L ventricle fxn normal with grade 1 DD detected. Pt at  risk for sleep apnea, neurology to complete Sleep Smart Study.  Continues to make progress with PT. CIR placement pending insurance authorization.  -Continue amlodipine, chlorthalidone, and lisinopril  -Continue ASA 81 and Plavix 75 for 3 weeks followed by ASA 81 alone.  -Atorvastatin daily  -Sleep Smart Study  -Telemetry  -Frequent neuro checks  -Fall precautions  -PT/OT following      Resolving AKI, likely multifactorial Inc in Cr to 1.7, baseline ~1.4. S/p LR bolus 1 L 12/04/20.  - Continue to monitor BMP - Discontinue lisinopril in setting of increase in Cr  - Continue chlorthalidone    HTN Improved sbp 150-160s on home meds  -Continue amlodipine, chlorthalidone, and lisinopril  -Monitor vitals     Back pain Likely in the setting of recent falls. CT negative, no worsening sensation. Likely MSK strain in the setting falls. -Tylenol prn -Lidocaine patch and Robaxin   IMA vasculitis  Mildly elevated sed rate and CRPwith mild leukocytosis on presentation. No concern for acute change, vascular surgery recommending no further intervention at the time.    HLD LDL above goal 144, risk factor for CVA.  -Continue home atorvastatin     Best Practice: Diet: heart healthy  VTE: Heparin IVF: none  Code: Full  Signature: 02/03/21, MD  Internal Medicine Resident, PGY-1 [name]  Pager: [phone] After 5pm on weekdays and 1pm on weekends: On Call pager [phone]

## 2020-12-04 NOTE — Progress Notes (Addendum)
STROKE TEAM PROGRESS NOTE   INTERVAL HISTORY No acute events overnight.   Non complaint with ASA/Plavix at home. No new events.  Echo shows normal ejection fraction without cardiac source of embolism.  He states he had a previous stroke in December 2021 and was left with mild right-sided residual deficits and had some trouble walking but was mostly otherwise independent in activities of daily living. Vitals:   12/03/20 2343 12/04/20 0354 12/04/20 0801 12/04/20 1130  BP: (!) 156/78 (!) 169/82 (!) 157/81 136/81  Pulse: 71 74 73 73  Resp: 16 17 17 15   Temp: 98.6 F (37 C) 98 F (36.7 C) 97.9 F (36.6 C) 98.2 F (36.8 C)  TempSrc: Oral Oral Oral Oral  SpO2: 97% 99% 96% 97%  Weight:  81.7 kg     CBC:  Recent Labs  Lab 12/01/20 0041 12/01/20 0134 12/02/20 0414 12/04/20 0101  WBC 13.8*  --  10.0 8.3  NEUTROABS 9.0*  --   --   --   HGB 16.5   < > 15.0 14.4  HCT 49.5   < > 44.0 43.8  MCV 91.5  --  91.5 91.4  PLT 354  --  304 342   < > = values in this interval not displayed.   Basic Metabolic Panel:  Recent Labs  Lab 12/03/20 0054 12/03/20 2148  NA 138 135  K 3.4* 4.1  CL 103 102  CO2 24 23  GLUCOSE 100* 94  BUN 15 18  CREATININE 1.16 1.71*  CALCIUM 9.1 9.1  MG 2.0  --    Lipid Panel:  Recent Labs  Lab 12/02/20 0414  CHOL 197  TRIG 104  HDL 32*  CHOLHDL 6.2  VLDL 21  LDLCALC 02/01/21*   HgbA1c:  Recent Labs  Lab 12/02/20 0414  HGBA1C 5.6   Urine Drug Screen:  Recent Labs  Lab 12/01/20 1401  LABOPIA NONE DETECTED  COCAINSCRNUR NONE DETECTED  LABBENZ NONE DETECTED  AMPHETMU NONE DETECTED  THCU POSITIVE*  LABBARB NONE DETECTED    Alcohol Level No results for input(s): ETH in the last 168 hours.  IMAGING past 24 hours No results found.  PHYSICAL EXAM Neurological Examination Mental Status: Alert and oriented x4, speech clear and fluent without errors .  Mild dysarthria.  And slow some occasional words Cranial Nerves: II: Temporal visual fields  intact with no extinction to DSS.   III,IV, VI: EOMI. No nystagmus. No ptosis.  V,VII: Mild left facial asymmetry., facial temp sensation equal bilaterally VIII: hearing intact to voice IX,X: No hypophonia XI: Symmetric XII: Midline tongue extension Motor: Right :  Upper extremity   5/5                                      Left:     Upper extremity   5/5             Lower extremity   5/5                                                  Lower extremity   5/5 Sensory: Temp intact throughout, bilaterally. No extinction to DSS.  Cerebellar: No ataxia  Gait: Deferred   NIHSS 2 Premorbid MRS 2  ASSESSMENT/PLAN Alec Sanchez is an  65 y.o. male with a PMHx of stroke, carotid artery occlusion, carotid artery stenosis s/p Right TCAR on 04/12/2020 by Dr. Randie Heinz for asymptomatic high grade ICA stenosisHLD and HTN who presented via EMS to the [facility] ED on Friday afternoon after family called for concerns of a stroke. He also has had right flank pain for a couple of days and had been evaluated at urgent care for this on Thursday afternoon; urinalysis was positive for hematuria; he was diagnosed with renal colic and discharged home. Upon arrival to home yesterday, patient was reportedly noted to have some slurring of his speech and right-sided facial droop. Daughter later mentioned to IM resident that the patient's first neurological deficits were noticed on Tuesday, at which time he had drooping of face, slurred speech worse than his baseline, AMS, loss of bowel and bladder continence, slowing of movement and speech, and difficulty holding things in his right hand. Patient later endorsed to IM resident that he had fallen twice on Tuesday and Wednesday and injured his back during the latter fall. On Friday, family had tried to contact patient by phone, but he did not answer, so EMS was called. On arrival to the ED, he was A&Ox3, appeared generally weak and reported not being able to sleep due to flank pain for a couple  of days. Exam in the ED revealed that he had generalized weakness in all extremities and his speech seemed slurred. He was noted to be hypertensive, and admitted that he had not taken any of his medications on the day of presentation.  SBPs have been in the high 160's to 202 this admission.    Stroke: 3 small acute ischemic infarctions involving the right anterior genu of the corpus callosum, periventricular white matter of the right posterior corona radiata, and right lentiform nucleus which are likely due to SVD   MRI brain: Three distinct 8 mm acute ischemic infarcts involving the right anterior genu of the corpus callosum, periventricular white matter of the right posterior corona radiata, and right lentiform nucleus . No associated hemorrhage or mass effect. Scattered T2/FLAIR signal abnormality involving the subcortical aspects of the right frontal and parietal regions as above, new as compared to prior MRI from 01/31/2020. 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 Also noted is underlying age-related cerebral atrophy with advanced chronic microvascular ischemic disease, progressed from prior. 3. CTA of head and neck: No emergent large vessel occlusion. Interval right carotid stenting without residual stenosis. 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 Progressive, severe proximal left vertebral artery stenosis. Unchanged 60% proximal left ICA stenosis. Unchanged mild to moderate bilateral intracranial ICA stenoses and mild left M1 stenosis. Aortic Atherosclerosisode Stroke CT head  2D Echo ejection fraction 60 to 65%.  No cardiac source of embolism. LDL 144 HgbA1c 5.6 VTE prophylaxis - recommended, per primary team     Diet   Diet Heart Room service appropriate? Yes with Assist; Fluid consistency: Thin   Prescribed but not taking ASA 325mg  and Plavix 75mg  as prescribed December 2021 after last stroke   Recommend continue ASA 325 mg and Plavix 75mg  with measures to improve compliance  Therapy recommendations:  CIR seen by OT and rehab coordinator today. Disposition:  TBD  Uncontrolled Hypertension Home meds:  Not taking his medications per his admission  Hypertensive to 202 since admission, currently stable Permissive hypertension (OK if < 220/120) but gradually normalize in 5-7 days Long-term BP goal normotensive  Hyperlipidemia Home meds:  lipitor 80mg  LDL 144, goal < 70  High intensity statin : Keep home lipitor  as he has not been complaint with taking it  Continue statin at discharge       Non-compliance with medication regimen Patient reports noncompliance in a ongoing way Barriers discussed with daughter and she will assist with medication management at home moving forward Home health RN for medication management at home should be considered if possible ?depression/grief  may be contributing to lack of caring about taking care of himself (grieving 3 recent deaths in the family)  Other Stroke Risk Factors Advanced Age >/= 33  History of strokes, 2021:  L basal ganglia / corona radiata infarct. Punctate L periatrial white matter infarct. Moderate small vessel disease w/ chronic lacunes and microhemorrhages. Details not available for other strokes.   Other Active Problems   Hospital day # 3  Patient presented with right subcortical lacunar infarcts from small vessel disease.  He appears to be at risk for sleep apnea and is willing to consider possible participation in the sleep smart study.  Will give information to review and decide.  Recommend aspirin 81 mg daily and Plavix 75 mg daily for 3 weeks followed by aspirin alone.  Patient counseled to be compliant with medications maintain aggressive risk factor modification.  Greater than 50% time during this 25-minute visit was spent in counseling and coordination of care about her stroke some questions.  Discussed with Dr.  2022, MD  [name] MD  To contact Stroke Continuity provider, please refer to Delia Heady. After hours, contact General Neurology

## 2020-12-04 NOTE — Hospital Course (Addendum)
Acute cerebral infarction History of cerebral infarctions, most recent December 2021 with residual right-sided weakness Neg CT head, MRI brain with acute ischemic infarcts consistent with hypertensive microangiopathy. CTA head/neck w/o acute changes. Pt advised on risk factor modifications. Continued ASA, plavix, and atorvastatin, and BP meds (amlodipine, chlorthalidone, and lisinopril). Monitored on tele, frequent neuro checks, received PT/OT. No intracardiac source of embolism noted on echo (LVEF 60-65%). L ventricle fxn normal, grade 1 DD noted detected on this transthoracic study. Continues to make progress with PT. Sleep Smart Study to possibly be done by neurology as is at risk for sleep apnea.  Final recommendations by neurology includes DAPT with ASA and Plavix for 3 weeks followed by ASA alone. Counseled on medication adherence and risk factor modification, including quitting smoking. Pt d/c to CIR.   Resolving AKI, likely multifactorial Inc in Cr to 1.71, baseline1.2. Lisinopril d/c and Cr return to baseline. Continued amlodipine and chlorthalidone.  - IV fluid bolus  - Continue to monitor BMP   HTN Stable sbp 160s  -Continue amlodipine and chlorthalidone, and d/c lisinopril after inc in Cr to 1.7, return back to baseline the next day. Can reassess Cr and determine appropriate regimen for patient.    Back pain Likely in the setting of recent falls. CT negative, no worsening sensation.  Likely MSK strain in the setting falls. -Tylenol prn -Lidocaine patch and Robaxin   IMA vasculitis  Mildly elevated sed rate (27) and CRP (2.5) with mild leukocytosis on presentation (13.8). No concern for acute change. Vascular surgery recommending no further intervention at the time.    HLD LDL above goal 144, risk factor for CVA.  -Continue home atorvastatin

## 2020-12-05 DIAGNOSIS — I639 Cerebral infarction, unspecified: Secondary | ICD-10-CM | POA: Diagnosis not present

## 2020-12-05 LAB — BASIC METABOLIC PANEL
Anion gap: 11 (ref 5–15)
BUN: 19 mg/dL (ref 8–23)
CO2: 23 mmol/L (ref 22–32)
Calcium: 9.6 mg/dL (ref 8.9–10.3)
Chloride: 102 mmol/L (ref 98–111)
Creatinine, Ser: 1.7 mg/dL — ABNORMAL HIGH (ref 0.61–1.24)
GFR, Estimated: 44 mL/min — ABNORMAL LOW (ref >60)
Glucose, Bld: 122 mg/dL — ABNORMAL HIGH (ref 70–99)
Potassium: 3.2 mmol/L — ABNORMAL LOW (ref 3.5–5.1)
Sodium: 136 mmol/L (ref 135–145)

## 2020-12-05 LAB — GLUCOSE, CAPILLARY: Glucose-Capillary: 107 mg/dL — ABNORMAL HIGH (ref 70–99)

## 2020-12-05 MED ORDER — POTASSIUM CHLORIDE CRYS ER 20 MEQ PO TBCR
40.0000 meq | EXTENDED_RELEASE_TABLET | Freq: Three times a day (TID) | ORAL | Status: AC
  Administered 2020-12-05 (×3): 40 meq via ORAL
  Filled 2020-12-05 (×3): qty 2

## 2020-12-05 MED ORDER — CHLORTHALIDONE 25 MG PO TABS
25.0000 mg | ORAL_TABLET | Freq: Every day | ORAL | Status: DC
  Administered 2020-12-05 – 2020-12-06 (×2): 25 mg via ORAL
  Filled 2020-12-05 (×2): qty 1

## 2020-12-05 NOTE — Progress Notes (Signed)
Physical Therapy Treatment Patient Details Name: Alec Sanchez MRN: [id number] DOB: 07-10-1955 Today's Date: 12/05/2020   History of Present Illness The pt is a 65 yo male presenting 10/6 with c/o drooping of face, slurred speech, AMS, weakness, and incontinence x2 days with reports of x2 falls. 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 PMH includes: previous CVA with R-sided deficits, HTN, and HLD.    PT Comments    Pt continues to require significant assist for all mobility. Continue to feel he would be good candidate for CIR.    Recommendations for follow up therapy are one component of a multi-disciplinary discharge planning process, led by the attending physician.  Recommendations may be updated based on patient status, additional functional criteria and insurance authorization.  Follow Up Recommendations  CIR     Equipment Recommendations  None recommended by PT    Recommendations for Other Services       Precautions / Restrictions Precautions Precautions: Fall     Mobility  Bed Mobility Overal bed mobility: Needs Assistance Bed Mobility: Supine to Sit     Supine to sit: Mod assist;HOB elevated     General bed mobility comments: Assist to bring legs off of bed, elevate trunk into sitting and bring hips to EOB    Transfers Overall transfer level: Needs assistance Equipment used: Rolling walker (2 wheeled) Transfers: Sit to/from Stand Sit to Stand: Mod assist;+2 safety/equipment         General transfer comment: Assist to bring hips up and for balance  Ambulation/Gait Ambulation/Gait assistance: Mod assist;+2 safety/equipment Gait Distance (Feet): 50 Feet Assistive device: Rolling walker (2 wheeled) Gait Pattern/deviations: Step-to pattern;Decreased step length - right;Decreased step length - left;Shuffle;Trunk flexed Gait velocity: decreased Gait velocity interpretation: <1.31 ft/sec, indicative of household  ambulator General Gait Details: Assist for balance and support. Initial lt lean. Multiple verbal cues to stay closer to the walker   Stairs             Wheelchair Mobility    Modified Rankin (Stroke Patients Only) Modified Rankin (Stroke Patients Only) Pre-Morbid Rankin Score: Slight disability Modified Rankin: Moderately severe disability     Balance Overall balance assessment: Needs assistance;History of Falls Sitting-balance support: Feet supported;Single extremity supported Sitting balance-Leahy Scale: Poor Sitting balance - Comments: min assist for static due to left lean Postural control: Left lateral lean Standing balance support: Bilateral upper extremity supported Standing balance-Leahy Scale: Poor Standing balance comment: walker and min assist for static standing                            Cognition Arousal/Alertness: Lethargic (eventually aroused) Behavior During Therapy: WFL for tasks assessed/performed Overall Cognitive Status: No family/caregiver present to determine baseline cognitive functioning Area of Impairment: Memory;Following commands;Safety/judgement;Problem solving                   Current Attention Level: Selective Memory: Decreased short-term memory Following Commands: Follows one step commands with increased time Safety/Judgement: Decreased awareness of deficits Awareness: Emergent Problem Solving: Slow processing;Requires verbal cues General Comments: Pt asleep on entry and initially lethargic and slow to respond. Improved once up and amb.      Exercises      General Comments        Pertinent Vitals/Pain Pain Assessment: No/denies pain    Home Living  Prior Function            PT Goals (current goals can now be found in the care plan section) Progress towards PT goals: Progressing toward goals    Frequency    Min 4X/week      PT Plan Current plan remains appropriate     Co-evaluation              AM-PAC PT "6 Clicks" Mobility   Outcome Measure  Help needed turning from your back to your side while in a flat bed without using bedrails?: A Lot Help needed moving from lying on your back to sitting on the side of a flat bed without using bedrails?: A Lot Help needed moving to and from a bed to a chair (including a wheelchair)?: A Lot Help needed standing up from a chair using your arms (e.g., wheelchair or bedside chair)?: A Lot Help needed to walk in hospital room?: A Lot Help needed climbing 3-5 steps with a railing? : Total 6 Click Score: 11    End of Session Equipment Utilized During Treatment: Gait belt Activity Tolerance: Patient tolerated treatment well Patient left: in chair;with call bell/phone within reach;with chair alarm set Nurse Communication: Mobility status PT Visit Diagnosis: Other abnormalities of gait and mobility (R26.89);Muscle weakness (generalized) (M62.81);Other symptoms and signs involving the nervous system (R29.898)     Time: 7944-4619 PT Time Calculation (min) (ACUTE ONLY): 18 min  Charges:  $Gait Training: 8-22 mins                     [facility] PT Acute Rehabilitation Services Pager [phone] Office [phone]    Angelina Ok [facility] 12/05/2020, 6:22 PM

## 2020-12-05 NOTE — Progress Notes (Signed)
   12/05/20 1947  Vitals  Temp 97.9 F (36.6 C)  Temp Source Oral  BP (!) 188/88  MAP (mmHg) 116  BP Location Right Arm  BP Method Automatic  Patient Position (if appropriate) Lying  Pulse Rate 82  Pulse Rate Source Monitor  ECG Heart Rate 78  Resp 20  Level of Consciousness  Level of Consciousness Alert  MEWS COLOR  MEWS Score Color Green  Oxygen Therapy  SpO2 100 %  O2 Device Room Air  Pain Assessment  Pain Scale 0-10  Pain Score 0  MEWS Score  MEWS Temp 0  MEWS Systolic 0  MEWS Pulse 0  MEWS RR 0  MEWS LOC 0  MEWS Score 0  Text paged Dr. Burnice Logan about B.P. being 188/88 . Call returned and M.D. aware. Order to  watch B.P. for now and call if B.P. gets higher or is a problem. Reden R.N. aware.

## 2020-12-05 NOTE — Progress Notes (Signed)
   Subjective: No acute overnight events. Patient was seen at bedside during rounds this morning and reports feeling well. Pt denies chest pain, SOB, headaches, and confusion. He reports loss of appetite today.   He is aware that insurance authorization was successful for CIR placement, pending a bed. No other complains or concerns at this time.     Objective:  Vital signs in last 24 hours: Vitals:   12/04/20 2321 12/05/20 0356 12/05/20 0725 12/05/20 1217  BP: (!) 146/96 (!) 143/88 (!) 148/88 (!) 148/93  Pulse: 70 78 70 77  Resp: 20 20 18 16   Temp: 98.2 F (36.8 C) 98.2 F (36.8 C) 98.2 F (36.8 C)   TempSrc: Oral Oral Oral   SpO2: 98% 98% 98% 98%  Weight:  83 kg     Constitutional: alert, well-appearing, in no acute distress HENT: normocephalic, atraumatic, mucous membranes moist Cardiovascular: regular rate and rhythm, no m/r/g Pulmonary/Chest: normal work of breathing on room air, lungs CTAB Abdominal: soft, non-tender to palpation, non-distended MSK: normal bulk and tone Neurological: alert & oriented x 3, strength exam 4/5 in bilateral upper and lower extremities. Excellent grip strength.  Skin: warm and dry Psych: normal behavior, normal affect   Assessment/Plan:  Principal Problem:   Acute ischemic stroke (HCC) Active Problems:   AKI (acute kidney injury) (HCC)  Alec Sanchez is a 65 y.o. with pertinent PMH of HTN, HLD, prior stroke who presents with falls back pain and admitted for AKI and acute cerebral infarction.   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 CTA head/neck w/o acute changes. No intracardiac source of embolism noted on echo. Pt advised on risk factor modifications. Pt at risk for sleep apnea, neurology to complete Sleep Smart Study. Continues to make progress with PT. CIR placement  pending bed availability.  -Continue amlodipine and chlorthalidone -Continue ASA 81 and Plavix 75 for 3 weeks followed by ASA 81 alone.  -Atorvastatin daily  -Sleep Smart Study  -Telemetry  -Frequent neuro checks  -Fall precautions  -PT/OT following      Resolving AKI, likely multifactorial Improvement in Cr to 1.48 after d/c lisinopril (baseline ~1.4).  - Continue to monitor BMP   HTN Improved sbp 150-160s   -Continue amlodipine and chlorthalidone -Monitor vitals     Back pain Likely in the setting of recent falls. CT negative, no worsening sensation. Likely MSK strain in the setting falls. -Tylenol prn -Lidocaine patch and Robaxin   IMA vasculitis  Mildly elevated sed rate and CRPwith mild leukocytosis on presentation. No concern for acute change, vascular surgery recommending no further intervention at the time.    HLD LDL above goal 144, risk factor for CVA.  -Continue home atorvastatin     Best Practice: Diet: heart healthy  VTE: Heparin IVF: none  Code: Full  Signature: Alec 2022, MD  Internal Medicine Resident, PGY-1 [facility] Res[facility]  Pager: [phone] After 5pm on weekdays and 1pm on weekends: On Call pager [phone]

## 2020-12-05 NOTE — Progress Notes (Addendum)
Inpatient Rehabilitation Admissions Coordinator   I met with patient and his daughter and SIL at bedside. I have [facility] approval for CIR , but no bed available today. I will follow up tomorrow with clarification if a bed will be available for his admit this week.  Danne Baxter, RN, MSN Rehab Admissions Coordinator [phone] 12/05/2020 4:33 PM

## 2020-12-05 NOTE — Progress Notes (Signed)
He has aSTROKE TEAM PROGRESS NOTE   INTERVAL HISTORY No acute events overnight.   Patient states his is neurologically status is unchanged.  He is now willing to participate in the sleep smart study for sleep apnea.  Well-appearing male recommend inpatient rehab. Therapy evaluation recommends inpatient rehab  Vitals:   12/04/20 2321 12/05/20 0356 12/05/20 0725 12/05/20 1217  BP: (!) 146/96 (!) 143/88 (!) 148/88 (!) 148/93  Pulse: 70 78 70 77  Resp: 20 20 18 16   Temp: 98.2 F (36.8 C) 98.2 F (36.8 C) 98.2 F (36.8 C)   TempSrc: Oral Oral Oral   SpO2: 98% 98% 98% 98%  Weight:  83 kg     CBC:  Recent Labs  Lab 12/01/20 0041 12/01/20 0134 12/02/20 0414 12/04/20 0101  WBC 13.8*  --  10.0 8.3  NEUTROABS 9.0*  --   --   --   HGB 16.5   < > 15.0 14.4  HCT 49.5   < > 44.0 43.8  MCV 91.5  --  91.5 91.4  PLT 354  --  304 342   < > = values in this interval not displayed.   Basic Metabolic Panel:  Recent Labs  Lab 12/03/20 0054 12/03/20 2148 12/05/20 0338  NA 138 135 136  K 3.4* 4.1 3.2*  CL 103 102 102  CO2 24 23 23   GLUCOSE 100* 94 122*  BUN 15 18 19   CREATININE 1.16 1.71* 1.70*  CALCIUM 9.1 9.1 9.6  MG 2.0  --   --    Lipid Panel:  Recent Labs  Lab 12/02/20 0414  CHOL 197  TRIG 104  HDL 32*  CHOLHDL 6.2  VLDL 21  LDLCALC *   HgbA1c:  Recent Labs  Lab 12/02/20 0414  HGBA1C 5.6   Urine Drug Screen:  Recent Labs  Lab 12/01/20 1401  LABOPIA NONE DETECTED  COCAINSCRNUR NONE DETECTED  LABBENZ NONE DETECTED  AMPHETMU NONE DETECTED  THCU POSITIVE*  LABBARB NONE DETECTED    Alcohol Level No results for input(s): ETH in the last 168 hours.  IMAGING past 24 hours No results found.  PHYSICAL EXAM Neurological Examination Mental Status: Alert and oriented x4, speech clear and fluent without errors .  Mild dysarthria.  And slow some occasional words Cranial Nerves: II: Temporal visual fields intact with no extinction to DSS.   III,IV, VI: EOMI. No  nystagmus. No ptosis.  V,VII: Mild left facial asymmetry., facial temp sensation equal bilaterally VIII: hearing intact to voice IX,X: No hypophonia XI: Symmetric XII: Midline tongue extension Motor: Right :  Upper extremity   5/5                                      Left:     Upper extremity   5/5             Lower extremity   5/5                                                  Lower extremity   5/5 Sensory: Temp intact throughout, bilaterally. No extinction to DSS.  Cerebellar: No ataxia  Gait: Deferred   NIHSS 2 Premorbid MRS 2  ASSESSMENT/PLAN Alec Sanchez is an 65 y.o. male with a PMHx  of stroke, carotid artery occlusion, carotid artery stenosis s/p Right TCAR on 04/12/2020 by Dr. Randie Heinz for asymptomatic high grade ICA stenosisHLD and HTN who presented via EMS to the [facility] ED on Friday afternoon after family called for concerns of a stroke. He also has had right flank pain for a couple of days and had been evaluated at urgent care for this on Thursday afternoon; urinalysis was positive for hematuria; he was diagnosed with renal colic and discharged home. Upon arrival to home yesterday, patient was reportedly noted to have some slurring of his speech and right-sided facial droop. Daughter later mentioned to IM resident that the patient's first neurological deficits were noticed on Tuesday, at which time he had drooping of face, slurred speech worse than his baseline, AMS, loss of bowel and bladder continence, slowing of movement and speech, and difficulty holding things in his right hand. Patient later endorsed to IM resident that he had fallen twice on Tuesday and Wednesday and injured his back during the latter fall. On Friday, family had tried to contact patient by phone, but he did not answer, so EMS was called. On arrival to the ED, he was A&Ox3, appeared generally weak and reported not being able to sleep due to flank pain for a couple of days. Exam in the ED revealed that he had generalized  weakness in all extremities and his speech seemed slurred. He was noted to be hypertensive, and admitted that he had not taken any of his medications on the day of presentation.  SBPs have been in the high 160's to 202 this admission.    Stroke: 3 small acute ischemic infarctions involving the right anterior genu of the corpus callosum, periventricular white matter of the right posterior corona radiata, and right lentiform nucleus which are likely due to SVD   MRI brain: Three distinct 8 mm acute ischemic infarcts involving the right anterior genu of the corpus callosum, periventricular white matter of the right posterior corona radiata, and right lentiform nucleus . No associated hemorrhage or mass effect. Scattered T2/FLAIR signal abnormality involving the subcortical aspects of the right frontal and parietal regions as above, new as compared to prior MRI from 01/31/2020. 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 Also noted is underlying age-related cerebral atrophy with advanced chronic microvascular ischemic disease, progressed from prior. 3. CTA of head and neck: No emergent large vessel occlusion. Interval right carotid stenting without residual stenosis. 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 Progressive, severe proximal left vertebral artery stenosis. Unchanged 60% proximal left ICA stenosis. Unchanged mild to moderate bilateral intracranial ICA stenoses and mild left M1 stenosis. Aortic Atherosclerosisode Stroke CT head  2D Echo ejection fraction 60 to 65%.  No cardiac source of embolism. LDL 144 HgbA1c 5.6 VTE prophylaxis - recommended, per primary team     Diet   Diet Heart Room service appropriate? Yes with Assist; Fluid consistency: Thin   Prescribed but not taking ASA 325mg  and Plavix 75mg  as prescribed December 2021 after last stroke  Recommend continue ASA 81 mg and Plavix 75mg  with measures to  improve compliance  Therapy recommendations:  CIR seen by OT and rehab coordinator today. Disposition:  TBD  Uncontrolled Hypertension Home meds:  Not taking his medications per his admission  Hypertensive to 202 since admission, currently stable Permissive hypertension (OK if < 220/120) but gradually normalize in 5-7 days Long-term BP goal normotensive  Hyperlipidemia Home meds: lipitor 80mg  LDL 144, goal <  70  High intensity statin : Keep home lipitor  as he has not been complaint with taking it  Continue statin at discharge       Non-compliance with medication regimen Patient reports noncompliance in a ongoing way Barriers discussed with daughter [name] and she will assist with medication management at home moving forward Home health RN for medication management at home should be considered if possible ?depression/grief  may be contributing to lack of caring about taking care of himself (grieving 3 recent deaths in the family)  Other Stroke Risk Factors Advanced Age >/= 66  History of strokes, 2021:  L basal ganglia / corona radiata infarct. Punctate L periatrial white matter infarct. Moderate small vessel disease w/ chronic lacunes and microhemorrhages. Details not available for other strokes.   Other Active Problems   Hospital day # 4  Patient presented with right subcortical lacunar infarcts from small vessel disease.  He appears to be at risk for sleep apnea and is willing to consider possible participation in the sleep smart study.  Will give information to review and decide.  Recommend aspirin 81 mg daily and Plavix 75 mg daily for 3 weeks followed by aspirin alone.  Patient counseled to be compliant with medications maintain aggressive risk factor modification.  Continue ongoing therapies and transfer to inpatient rehab when bed available.  Greater than 50% time during this 25-minute visit was spent in counseling and coordination of care about her stroke some questions.   Discussed with patient and his daughter whom I spoke to over the phone and answered questions.  Delia Heady MD  To contact Stroke Continuity provider, please refer to [email or web address]. After hours, contact General Neurology

## 2020-12-06 ENCOUNTER — Inpatient Hospital Stay (HOSPITAL_COMMUNITY)
Admission: RE | Admit: 2020-12-06 | Discharge: 2020-12-21 | DRG: 057 | Disposition: A | Discharge status: Home Health | Payer: No Typology Code available for payment source | Source: Intra-hospital | Attending: Physical Medicine and Rehabilitation | Admitting: Physical Medicine and Rehabilitation

## 2020-12-06 ENCOUNTER — Other Ambulatory Visit: Payer: Self-pay

## 2020-12-06 DIAGNOSIS — R0989 Other specified symptoms and signs involving the circulatory and respiratory systems: Secondary | ICD-10-CM | POA: Diagnosis present

## 2020-12-06 DIAGNOSIS — Z713 Dietary counseling and surveillance: Secondary | ICD-10-CM

## 2020-12-06 DIAGNOSIS — F419 Anxiety disorder, unspecified: Secondary | ICD-10-CM | POA: Diagnosis present

## 2020-12-06 DIAGNOSIS — Z87891 Personal history of nicotine dependence: Secondary | ICD-10-CM | POA: Diagnosis not present

## 2020-12-06 DIAGNOSIS — R7401 Elevation of levels of liver transaminase levels: Secondary | ICD-10-CM | POA: Diagnosis present

## 2020-12-06 DIAGNOSIS — F1290 Cannabis use, unspecified, uncomplicated: Secondary | ICD-10-CM | POA: Diagnosis present

## 2020-12-06 DIAGNOSIS — I69322 Dysarthria following cerebral infarction: Secondary | ICD-10-CM

## 2020-12-06 DIAGNOSIS — Z7902 Long term (current) use of antithrombotics/antiplatelets: Secondary | ICD-10-CM

## 2020-12-06 DIAGNOSIS — N179 Acute kidney failure, unspecified: Secondary | ICD-10-CM | POA: Diagnosis not present

## 2020-12-06 DIAGNOSIS — E785 Hyperlipidemia, unspecified: Secondary | ICD-10-CM | POA: Diagnosis present

## 2020-12-06 DIAGNOSIS — I10 Essential (primary) hypertension: Secondary | ICD-10-CM | POA: Diagnosis not present

## 2020-12-06 DIAGNOSIS — K922 Gastrointestinal hemorrhage, unspecified: Secondary | ICD-10-CM | POA: Diagnosis not present

## 2020-12-06 DIAGNOSIS — M62838 Other muscle spasm: Secondary | ICD-10-CM | POA: Diagnosis not present

## 2020-12-06 DIAGNOSIS — F1010 Alcohol abuse, uncomplicated: Secondary | ICD-10-CM | POA: Diagnosis present

## 2020-12-06 DIAGNOSIS — I129 Hypertensive chronic kidney disease with stage 1 through stage 4 chronic kidney disease, or unspecified chronic kidney disease: Secondary | ICD-10-CM | POA: Diagnosis present

## 2020-12-06 DIAGNOSIS — G8929 Other chronic pain: Secondary | ICD-10-CM

## 2020-12-06 DIAGNOSIS — N189 Chronic kidney disease, unspecified: Secondary | ICD-10-CM | POA: Diagnosis present

## 2020-12-06 DIAGNOSIS — E663 Overweight: Secondary | ICD-10-CM | POA: Diagnosis present

## 2020-12-06 DIAGNOSIS — N183 Chronic kidney disease, stage 3 unspecified: Secondary | ICD-10-CM | POA: Diagnosis present

## 2020-12-06 DIAGNOSIS — I69392 Facial weakness following cerebral infarction: Secondary | ICD-10-CM | POA: Diagnosis not present

## 2020-12-06 DIAGNOSIS — Z8249 Family history of ischemic heart disease and other diseases of the circulatory system: Secondary | ICD-10-CM

## 2020-12-06 DIAGNOSIS — Z7151 Drug abuse counseling and surveillance of drug abuser: Secondary | ICD-10-CM

## 2020-12-06 DIAGNOSIS — Z7982 Long term (current) use of aspirin: Secondary | ICD-10-CM | POA: Diagnosis not present

## 2020-12-06 DIAGNOSIS — F32A Depression, unspecified: Secondary | ICD-10-CM | POA: Diagnosis present

## 2020-12-06 DIAGNOSIS — E669 Obesity, unspecified: Secondary | ICD-10-CM | POA: Diagnosis present

## 2020-12-06 DIAGNOSIS — Z79899 Other long term (current) drug therapy: Secondary | ICD-10-CM | POA: Diagnosis not present

## 2020-12-06 DIAGNOSIS — I69351 Hemiplegia and hemiparesis following cerebral infarction affecting right dominant side: Principal | ICD-10-CM

## 2020-12-06 DIAGNOSIS — K921 Melena: Secondary | ICD-10-CM | POA: Diagnosis not present

## 2020-12-06 DIAGNOSIS — Z5329 Procedure and treatment not carried out because of patient's decision for other reasons: Secondary | ICD-10-CM | POA: Diagnosis not present

## 2020-12-06 DIAGNOSIS — R933 Abnormal findings on diagnostic imaging of other parts of digestive tract: Secondary | ICD-10-CM

## 2020-12-06 DIAGNOSIS — N23 Unspecified renal colic: Secondary | ICD-10-CM | POA: Diagnosis present

## 2020-12-06 DIAGNOSIS — Z885 Allergy status to narcotic agent status: Secondary | ICD-10-CM

## 2020-12-06 DIAGNOSIS — K5901 Slow transit constipation: Secondary | ICD-10-CM | POA: Diagnosis present

## 2020-12-06 DIAGNOSIS — I639 Cerebral infarction, unspecified: Secondary | ICD-10-CM | POA: Diagnosis present

## 2020-12-06 DIAGNOSIS — Z91199 Patient's noncompliance with other medical treatment and regimen due to unspecified reason: Secondary | ICD-10-CM

## 2020-12-06 DIAGNOSIS — G4700 Insomnia, unspecified: Secondary | ICD-10-CM | POA: Diagnosis present

## 2020-12-06 DIAGNOSIS — Z6826 Body mass index (BMI) 26.0-26.9, adult: Secondary | ICD-10-CM

## 2020-12-06 DIAGNOSIS — E876 Hypokalemia: Secondary | ICD-10-CM | POA: Diagnosis not present

## 2020-12-06 DIAGNOSIS — Z823 Family history of stroke: Secondary | ICD-10-CM

## 2020-12-06 DIAGNOSIS — M545 Low back pain, unspecified: Secondary | ICD-10-CM

## 2020-12-06 LAB — BASIC METABOLIC PANEL
Anion gap: 9 (ref 5–15)
BUN: 21 mg/dL (ref 8–23)
CO2: 21 mmol/L — ABNORMAL LOW (ref 22–32)
Calcium: 9.5 mg/dL (ref 8.9–10.3)
Chloride: 104 mmol/L (ref 98–111)
Creatinine, Ser: 1.48 mg/dL — ABNORMAL HIGH (ref 0.61–1.24)
GFR, Estimated: 52 mL/min — ABNORMAL LOW (ref >60)
Glucose, Bld: 92 mg/dL (ref 70–99)
Potassium: 4.4 mmol/L (ref 3.5–5.1)
Sodium: 134 mmol/L — ABNORMAL LOW (ref 135–145)

## 2020-12-06 LAB — CBC
HCT: 49 % (ref 39.0–52.0)
Hemoglobin: 16 g/dL (ref 13.0–17.0)
MCH: 30.4 pg (ref 26.0–34.0)
MCHC: 32.7 g/dL (ref 30.0–36.0)
MCV: 93.2 fL (ref 80.0–100.0)
Platelets: 371 10*3/uL (ref 150–400)
RBC: 5.26 MIL/uL (ref 4.22–5.81)
RDW: 14.6 % (ref 11.5–15.5)
WBC: 10 10*3/uL (ref 4.0–10.5)
nRBC: 0 % (ref 0.0–0.2)

## 2020-12-06 LAB — GLUCOSE, CAPILLARY: Glucose-Capillary: 100 mg/dL — ABNORMAL HIGH (ref 70–99)

## 2020-12-06 MED ORDER — BLOOD PRESSURE CONTROL BOOK
Freq: Once | Status: AC
  Filled 2020-12-06: qty 1

## 2020-12-06 MED ORDER — ATORVASTATIN CALCIUM 80 MG PO TABS
80.0000 mg | ORAL_TABLET | Freq: Every day | ORAL | Status: DC
  Administered 2020-12-06 – 2020-12-20 (×15): 80 mg via ORAL
  Filled 2020-12-06 (×15): qty 1

## 2020-12-06 MED ORDER — ASPIRIN EC 81 MG PO TBEC
81.0000 mg | DELAYED_RELEASE_TABLET | Freq: Every day | ORAL | Status: DC
  Administered 2020-12-07 – 2020-12-11 (×5): 81 mg via ORAL
  Filled 2020-12-06 (×5): qty 1

## 2020-12-06 MED ORDER — ASPIRIN 81 MG PO TBEC: 81.0000 mg | DELAYED_RELEASE_TABLET | Freq: Every day | ORAL | 0 refills | Status: DC

## 2020-12-06 MED ORDER — METHOCARBAMOL 500 MG PO TABS
500.0000 mg | ORAL_TABLET | Freq: Three times a day (TID) | ORAL | Status: DC | PRN
  Administered 2020-12-06 – 2020-12-19 (×6): 500 mg via ORAL
  Filled 2020-12-06 (×7): qty 1

## 2020-12-06 MED ORDER — HEPARIN SODIUM (PORCINE) 5000 UNIT/ML IJ SOLN
5000.0000 [IU] | Freq: Three times a day (TID) | INTRAMUSCULAR | Status: DC
  Administered 2020-12-06 – 2020-12-11 (×16): 5000 [IU] via SUBCUTANEOUS
  Filled 2020-12-06 (×16): qty 1

## 2020-12-06 MED ORDER — CHLORTHALIDONE 25 MG PO TABS
25.0000 mg | ORAL_TABLET | Freq: Every day | ORAL | Status: DC
  Administered 2020-12-07 – 2020-12-11 (×5): 25 mg via ORAL
  Filled 2020-12-06 (×7): qty 1

## 2020-12-06 MED ORDER — CLOPIDOGREL BISULFATE 75 MG PO TABS: 75.0000 mg | ORAL_TABLET | Freq: Every day | ORAL | 0 refills | Status: DC

## 2020-12-06 MED ORDER — METHOCARBAMOL 500 MG PO TABS: 500.0000 mg | ORAL_TABLET | Freq: Three times a day (TID) | ORAL | 0 refills | Status: DC | PRN

## 2020-12-06 MED ORDER — LIDOCAINE 5 % EX PTCH
1.0000 | MEDICATED_PATCH | CUTANEOUS | Status: DC
  Administered 2020-12-06 – 2020-12-20 (×15): 1 via TRANSDERMAL
  Filled 2020-12-06 (×16): qty 1

## 2020-12-06 MED ORDER — ACETAMINOPHEN 650 MG RE SUPP: 650.0000 mg | Freq: Four times a day (QID) | RECTAL | Status: DC | PRN

## 2020-12-06 MED ORDER — ACETAMINOPHEN 325 MG PO TABS: 650.0000 mg | ORAL_TABLET | Freq: Four times a day (QID) | ORAL | 0 refills | Status: DC | PRN

## 2020-12-06 MED ORDER — AMLODIPINE BESYLATE 10 MG PO TABS
10.0000 mg | ORAL_TABLET | Freq: Every morning | ORAL | Status: DC
  Administered 2020-12-07 – 2020-12-21 (×15): 10 mg via ORAL
  Filled 2020-12-06 (×15): qty 1

## 2020-12-06 MED ORDER — ACETAMINOPHEN 325 MG PO TABS
650.0000 mg | ORAL_TABLET | Freq: Four times a day (QID) | ORAL | Status: DC | PRN
  Administered 2020-12-12 – 2020-12-14 (×2): 650 mg via ORAL
  Filled 2020-12-06 (×4): qty 2

## 2020-12-06 MED ORDER — HEPARIN SODIUM (PORCINE) 5000 UNIT/ML IJ SOLN: 5000.0000 [IU] | Freq: Three times a day (TID) | INTRAMUSCULAR | Status: DC

## 2020-12-06 MED ORDER — CLOPIDOGREL BISULFATE 75 MG PO TABS
75.0000 mg | ORAL_TABLET | Freq: Every day | ORAL | Status: DC
  Administered 2020-12-07 – 2020-12-11 (×5): 75 mg via ORAL
  Filled 2020-12-06 (×5): qty 1

## 2020-12-06 MED ORDER — PAROXETINE HCL 20 MG PO TABS
20.0000 mg | ORAL_TABLET | Freq: Every day | ORAL | Status: DC
  Administered 2020-12-07 – 2020-12-21 (×15): 20 mg via ORAL
  Filled 2020-12-06 (×15): qty 1

## 2020-12-06 NOTE — Progress Notes (Signed)
Report given to CIR RN Sam.

## 2020-12-06 NOTE — Progress Notes (Addendum)
Inpatient Rehabilitation Medication Review by a Pharmacist  A complete drug regimen review was completed for this patient to identify any potential clinically significant medication issues.  High Risk Drug Classes Is patient taking? Indication by Medication  Antipsychotic No   Anticoagulant Yes, as an intravenous medication Heparin SQ for VTE prophylaxis  Antibiotic No   Opioid No   Antiplatelet Yes Aspirin/Plavix x 3 weeks, followed by aspirin alone s/p stroke  Hypoglycemics/insulin No   Vasoactive Medication Yes Amlodipine for hypertension, chlorthalidone for hypertension, atorvastatin for hyperlipidemia  Chemotherapy No   Other Yes Lidocaine patch for back pain, Robaxin PRN for muscle spasms, paroxetine for depression     Type of Medication Issue Identified Description of Issue Recommendation(s)  Drug Interaction(s) (clinically significant)     Duplicate Therapy     Allergy     No Medication Administration End Date  Plavix Add 3-week stop date for Plavix per Neuro recommendations  Incorrect Dose     Additional Drug Therapy Needed  -Prazosin listed on discharge medications, not reordered at Rehab on admission -Ferrous sulfate, Percocet PRN, and vitamin D listed on discharge summary but not reordered on admission to Rehab -Resume as needed for BP control   -Resume medications as appropriate  Significant med changes from prior encounter (inform family/care partners about these prior to discharge).    Other       Clinically significant medication issues were identified that warrant physician communication and completion of prescribed/recommended actions by midnight of the next day:  Yes  Name of provider notified for urgent issues identified: Raulkar, K  Provider Method of Notification: Epic secure chat    Pharmacist comments: Provider notified to reorder medications as appropriate, asked to place stop date on Plavix  Time spent performing this drug regimen review  (minutes):  15   Leia Alf, PharmD, BCPS Please check AMION for all [facility] contact numbers Clinical Pharmacist 12/06/2020 5:00 PM

## 2020-12-06 NOTE — H&P (Signed)
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  History taken from chart review and patient.  Patient lives alone.  1 level apartment.  Daughter and son-in-law live nearby.  Independent with assistive device prior to admission and still drives short distances.  He presented on 12/01/2020 with dysarthria, facial droop, AMS.  Head CT unremarkable for acute intracranial process, chronic atrophic and ischemic changes without acute findings.  CT angiogram head and neck no emergent large vessel occlusion.  Unchanged 60% proximal left ICA stenosis.  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  MRI of the brain showed 3 distinct 8 mm acute ischemic infarct involving the right anterior genu of the corpus callosum periventricular white matter of the right posterior corona radiata and right lentiform nucleus.  No associated hemorrhage or mass-effect.  Patient did not receive tPA.  CT angiogram chest abdomen pelvis showed occlusion of the proximal IMA at its origin, with mild perivascular fat stranding along the ventral aspect of the distal aorta and IMA origin.  Echocardiogram ejection fraction 60-65%, no wall motion abnormalities grade 1 diastolic dysfunction.  Admission chemistries unremarkable except potassium 3.4, glucose 107, BUN 24, creatinine 1.63, WBC 13,800, urine drug screen positive marijuana, troponin 47, D-dimer 1.28, sedimentation rate 27.  Currently maintained on aspirin 81 mg daily and Plavix 75 mg daily for CVA prophylaxis x3  weeks then aspirin alone.  Subcutaneous heparin for DVT prophylaxis.  Tolerating a regular consistency diet.  Therapy evaluations completed due to patient decreased functional mobility and a generalized weakness. Patient was admitted for a comprehensive rehab program.  Please see preadmission assessment from earlier today as well.  Review of Systems  Constitutional:  Negative for chills and fever.  HENT:  Negative for hearing loss.   Eyes:  Negative for blurred vision and double vision.  Respiratory:  Negative for cough and shortness of breath.   Cardiovascular:  Positive for leg swelling. Negative for chest pain and palpitations.  Gastrointestinal:  Positive for constipation. Negative for heartburn, nausea and vomiting.  Genitourinary:  Negative for dysuria and flank pain.  Musculoskeletal:  Positive for back pain and myalgias.  Skin:  Negative for rash.  Neurological:  Positive for speech change and weakness.  Psychiatric/Behavioral:  Positive for depression. The patient has insomnia.        Anxiety  All other systems reviewed and are negative. Past Medical History:  Diagnosis Date   Anxiety    Carotid artery occlusion    Depression    Hyperlipidemia    Hypertension    Stroke [facility])    Past Surgical History:  Procedure Laterality Date   ANKLE SURGERY Left    TRANSCAROTID ARTERY REVASCULARIZATION  Right 04/12/2020   Procedure: RIGHT TRANSCAROTID ARTERY REVASCULARIZATION;  Surgeon: Maeola Harman, MD;  Location: [facility] OR;  Service: Vascular;  Laterality: Right;   ULTRASOUND GUIDANCE FOR VASCULAR ACCESS Left 04/12/2020   Procedure: ULTRASOUND GUIDANCE FOR VASCULAR ACCESS, left femoral vein;  Surgeon: Maeola Harman, MD;  Location: [facility] OR;  Service: Vascular;  Laterality: Left;   No family history on file. Social  History:  reports that he has never smoked. He has never used smokeless tobacco. He reports that he does not currently use alcohol. He reports that he does  not use drugs. Allergies:  Allergies  Allergen Reactions   Trazodone Nausea And Vomiting   Medications Prior to Admission  Medication Sig Dispense Refill   amLODipine (NORVASC) 10 MG tablet Take 10 mg by mouth every morning.     aspirin EC 325 MG tablet Take 325 mg by mouth daily.     atorvastatin (LIPITOR) 80 MG tablet Take 80 mg by mouth at bedtime.     chlorthalidone (HYGROTON) 25 MG tablet Take 25 mg by mouth daily. For high blood pressure     Cholecalciferol 25 MCG (1000 UT) tablet Take 1,000 Units by mouth every morning.     clopidogrel (PLAVIX) 75 MG tablet Take 75 mg by mouth daily.     ferrous sulfate 325 (65 FE) MG tablet Take 325 mg by mouth every Monday, Wednesday, and Friday.     lidocaine (LIDODERM) 5 % Place 1 patch onto the skin daily. Remove & Discard patch within 12 hours or as directed by MD     lisinopril (ZESTRIL) 10 MG tablet Take 30 mg by mouth at bedtime.     naproxen (NAPROSYN) 500 MG tablet Take 500 mg by mouth daily as needed (back pain). Take with food and 16 oz water     PARoxetine (PAXIL) 40 MG tablet Take 20 mg by mouth every morning. For mental health     prazosin (MINIPRESS) 1 MG capsule Take 1 capsule (1 mg total) by mouth at bedtime. (Patient taking differently: Take 1 mg by mouth at bedtime. For enlarged prostate and for nightmares) 30 capsule 0   sildenafil (VIAGRA) 100 MG tablet Take 100 mg by mouth daily as needed for erectile dysfunction (do not take within 6 hours of taking prazosin).     aspirin EC 81 MG EC tablet Take 1 tablet (81 mg total) by mouth daily. Swallow whole. (Patient not taking: No sig reported) 30 tablet 11   nicotine (NICODERM CQ - DOSED IN MG/24 HOURS) 21 mg/24hr patch Place 21 mg onto the skin daily. (Patient not taking: Reported on 12/01/2020)     nicotine polacrilex (NICORETTE) 2 MG gum Take 2 mg by mouth See admin instructions. Chew one piece (2 mg) in mouth as directed for smoking cessation: Weeks 1-6 - chew one piece of gum (2 mg)  every 1-2 hours (max 24 pieces/day); to increase chances of quitting, chew at least 9 pieces/day during the first 6 weeks; weeks 7-9 - chew one piece of gum (2 mg) every 2-4 hours (max 24 pieces/day); weeks 10-12 - chew one piece of gum (2 mg) every 4-8 hours (max 24 pieces/day) (Patient not taking: Reported on 12/01/2020)     oxyCODONE-acetaminophen (PERCOCET/ROXICET) 5-325 MG tablet Take 1-2 tablets by mouth every 4 (four) hours as needed for moderate pain. (Patient not taking: No sig reported) 12 tablet 0    Drug Regimen Review  Drug regimen was reviewed and remains appropriate with no significant issues identified  Home: Home Living Family/patient expects to be discharged to:: Private residence Living Arrangements: Alone Available Help at Discharge: Family, Available PRN/intermittently Type of Home: Apartment Home Access: Elevator Home Layout: One level Bathroom Shower/Tub: Health visitor: Standard Bathroom Accessibility: Yes Home Equipment: Cane - single point, Grab bars - tub/shower, Shower seat - built in Additional Comments: daughter and son-in-law live nearby   Functional  History: Prior Function Level of Independence: Independent with assistive device(s) Comments: Per PT and confirmed: pt reports using cane, drives to son-in-law's house every day for meals  Functional Status:  Mobility: Bed Mobility Overal bed mobility: Needs Assistance Bed Mobility: Supine to Sit Supine to sit: Mod assist, HOB elevated Sit to supine: Min assist General bed mobility comments: Assist to bring legs off of bed, elevate trunk into sitting and bring hips to EOB Transfers Overall transfer level: Needs assistance Equipment used: Rolling walker (2 wheeled) Transfers: Sit to/from Stand Sit to Stand: Mod assist, +2 safety/equipment Stand pivot transfers: Mod assist, +2 safety/equipment General transfer comment: Assist to bring hips up and for  balance Ambulation/Gait Ambulation/Gait assistance: Mod assist, +2 safety/equipment Gait Distance (Feet): 50 Feet Assistive device: Rolling walker (2 wheeled) Gait Pattern/deviations: Step-to pattern, Decreased step length - right, Decreased step length - left, Shuffle, Trunk flexed General Gait Details: Assist for balance and support. Initial lt lean. Multiple verbal cues to stay closer to the walker Gait velocity: decreased Gait velocity interpretation: <1.31 ft/sec, indicative of household ambulator    ADL: ADL Overall ADL's : Needs assistance/impaired Eating/Feeding: Moderate assistance, Sitting Grooming: Wash/dry hands, Wash/dry face, Moderate assistance, Sitting Upper Body Bathing: Maximal assistance, Sitting Lower Body Bathing: Maximal assistance, Bed level Upper Body Dressing : Moderate assistance, Sitting Lower Body Dressing: Sitting/lateral leans, Minimal assistance Lower Body Dressing Details (indicate cue type and reason): Min A to don socks via figure four position sitting EOB, assist to cross LEs and intermittent cues/min guard to correct LOB sitting EOB Toilet Transfer: Moderate assistance, Stand-pivot, +2 for safety/equipment, RW Toileting- Clothing Manipulation and Hygiene: Maximal assistance, Sit to/from stand Functional mobility during ADLs: Minimal assistance, +2 for safety/equipment, Rolling walker, Cueing for sequencing, Cueing for safety General ADL Comments: Pt with improving awareness of deficits though still noted with sitting/standing balance impairments, cues needed for body mechanics to decrease fall risk during tasks.  Cognition: Cognition Overall Cognitive Status: No family/caregiver present to determine baseline cognitive functioning Orientation Level: Oriented X4 Cognition Arousal/Alertness: Lethargic (eventually aroused) Behavior During Therapy: WFL for tasks assessed/performed Overall Cognitive Status: No family/caregiver present to determine  baseline cognitive functioning Area of Impairment: Memory, Following commands, Safety/judgement, Problem solving Current Attention Level: Selective Memory: Decreased short-term memory Following Commands: Follows one step commands with increased time Safety/Judgement: Decreased awareness of deficits Awareness: Emergent Problem Solving: Slow processing, Requires verbal cues General Comments: Pt asleep on entry and initially lethargic and slow to respond. Improved once up and amb.  Physical Exam: Blood pressure (!) 158/89, pulse 72, temperature 97.9 F (36.6 C), temperature source Oral, resp. rate 20, weight 84.1 kg, SpO2 95 %. Physical Exam Constitutional:      General: He is not in acute distress.    Appearance: Normal appearance.  HENT:     Head: Normocephalic and atraumatic.     Nose: Nose normal.  Eyes:     General:        Right eye: No discharge.        Left eye: No discharge.     Extraocular Movements: Extraocular movements intact.  Cardiovascular:     Rate and Rhythm: Normal rate and regular rhythm.  Pulmonary:     Effort: Pulmonary effort is normal.     Breath sounds: Normal breath sounds.  Abdominal:     General: Bowel sounds are normal. There is no distension.     Palpations: Abdomen is soft.  Musculoskeletal:     Cervical back: Normal range of  motion and neck supple.     Comments: No edema or tenderness in extremities  Skin:    General: Skin is warm and dry.  Neurological:     Mental Status: He is alert.     Comments: Alert and oriented, except for date Makes eye contact with examiner.   Provides name and age as well as place but somewhat slow to process.   Speech is dysarthric but intelligible.   Follows simple commands. Motor: 4-/5 grossly throughout  Psychiatric:        Mood and Affect: Affect is flat.        Speech: Speech is delayed.    Results for orders placed or performed during the hospital encounter of 12/01/20 (from the past 48 hour(s))  Basic  metabolic panel     Status: Abnormal   Collection Time: 12/05/20  3:38 AM  Result Value Ref Range   Sodium 136 135 - 145 mmol/L   Potassium 3.2 (L) 3.5 - 5.1 mmol/L   Chloride 102 98 - 111 mmol/L   CO2 23 22 - 32 mmol/L   Glucose, Bld 122 (H) 70 - 99 mg/dL    Comment: Glucose reference range applies only to samples taken after fasting for at least 8 hours.   BUN 19 8 - 23 mg/dL   Creatinine, Ser 5.42 (H) 0.61 - 1.24 mg/dL   Calcium 9.6 8.9 - 70.6 mg/dL   GFR, Estimated 44 (L) >60 mL/min    Comment: (NOTE) Calculated using the CKD-EPI Creatinine Equation (2021)    Anion gap 11 5 - 15    Comment: Performed at [facility], [address], [place], [place] [address]  Glucose, capillary     Status: Abnormal   Collection Time: 12/05/20  9:40 PM  Result Value Ref Range   Glucose-Capillary 107 (H) 70 - 99 mg/dL    Comment: Glucose reference range applies only to samples taken after fasting for at least 8 hours.  Basic metabolic panel     Status: Abnormal   Collection Time: 12/06/20  1:45 AM  Result Value Ref Range   Sodium 134 (L) 135 - 145 mmol/L   Potassium 4.4 3.5 - 5.1 mmol/L   Chloride 104 98 - 111 mmol/L   CO2 21 (L) 22 - 32 mmol/L   Glucose, Bld 92 70 - 99 mg/dL    Comment: Glucose reference range applies only to samples taken after fasting for at least 8 hours.   BUN 21 8 - 23 mg/dL   Creatinine, Ser 8.31 (H) 0.61 - 1.24 mg/dL   Calcium 9.5 8.9 - 51.7 mg/dL   GFR, Estimated 52 (L) >60 mL/min    Comment: (NOTE) Calculated using the CKD-EPI Creatinine Equation (2021)    Anion gap 9 5 - 15    Comment: Performed at [facility], [address], [place], [place] [address]   No results found.     Medical Problem List and Plan: 1.  Right facial droop with dysarthria and altered mental status secondary to 3 small acute ischemic infarcts involving the right anterior genu of the corpus callosum, periventricular white matter of the right posterior corona radiata, and  right lentiform nucleus likely due to small vessel disease as well as history of CVA 2021 with residual right-sided weakness  -patient may shower  -ELOS/Goals: 10 to 15 days/supervision/min a  Admit to CIR 2.  Antithrombotics: -DVT/anticoagulation:  Pharmaceutical: Heparin  -antiplatelet therapy: Aspirin 81 mg daily and Plavix 75 mg  day x3 weeks then aspirin alone 3. Pain Management: Lidoderm patch, Robaxin as needed muscle spasms  Monitor with increased exertion 4. Mood: Paxil 20 mg daily  -antipsychotic agents: N/A 5. Neuropsych: This patient is capable of making decisions on his own behalf. 6. Skin/Wound Care: Routine skin checks 7. Fluids/Electrolytes/Nutrition: Routine in and outs  CMP ordered for tomorrow a.m. 8.  Hypertension.  Norvasc 10 mg daily, [place] 25 mg daily.    Monitor with increased mobility 9.  Hyperlipidemia: Lipitor 10.  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  Follow-up outpatient 11.  History of tobacco use as well as marijuana.  Urine drug screen positive marijuana.  Counseling 12.  History of hematuria/renal colic.  Continue to monitor 13.  Medical noncompliance.  Counseling   Mcarthur Rossetti Angiulli, PA-C 12/06/2020  I have personally performed a face to face diagnostic evaluation, including, but not limited to relevant history and physical exam findings, of this patient and developed relevant assessment and plan.  Additionally, I have reviewed and concur with the physician assistant's documentation above.  Maryla Morrow, MD, ABPMR

## 2020-12-06 NOTE — Progress Notes (Signed)
Physical Therapy Treatment Patient Details Name: Alec Sanchez MRN: [id number] DOB: January 19, 1956 Today's Date: 12/06/2020   History of Present Illness The pt is a 65 yo male presenting 10/6 with c/o drooping of face, slurred speech, AMS, weakness, and incontinence x2 days with reports of x2 falls. 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 PMH includes: previous CVA with R-sided deficits, HTN, and HLD.    PT Comments    Pt much more alert today since I didn't have to wake him from a sound sleep. Pt with improvements with gait, transfers and bed mobility. Continue to recommend CIR for futher rehab.    Recommendations for follow up therapy are one component of a multi-disciplinary discharge planning process, led by the attending physician.  Recommendations may be updated based on patient status, additional functional criteria and insurance authorization.  Follow Up Recommendations  CIR     Equipment Recommendations  None recommended by PT    Recommendations for Other Services       Precautions / Restrictions Precautions Precautions: Fall Restrictions Weight Bearing Restrictions: No     Mobility  Bed Mobility Overal bed mobility: Needs Assistance Bed Mobility: Supine to Sit     Supine to sit: Min assist;HOB elevated     General bed mobility comments: Assist to elevate trunk into sitting and bring hips to EOB    Transfers Overall transfer level: Needs assistance Equipment used: Rolling walker (2 wheeled) Transfers: Sit to/from Stand Sit to Stand: Min assist         General transfer comment: Assist to bring hips up and for balance  Ambulation/Gait Ambulation/Gait assistance: Mod assist;+2 safety/equipment Gait Distance (Feet): 90 Feet (90' x 1, 50' x 1) Assistive device: Rolling walker (2 wheeled) Gait Pattern/deviations: Step-to pattern;Decreased step length - right;Decreased step length - left;Shuffle;Trunk flexed Gait  velocity: decreased Gait velocity interpretation: <1.31 ft/sec, indicative of household ambulator General Gait Details: Assist for balance and support. As pt fatigues his steps become shorter and he lets walker get too far in front of him. Verbal cues to incr step length and stay closer to walker   Stairs             Wheelchair Mobility    Modified Rankin (Stroke Patients Only) Modified Rankin (Stroke Patients Only) Pre-Morbid Rankin Score: Slight disability Modified Rankin: Moderately severe disability     Balance Overall balance assessment: Needs assistance;History of Falls Sitting-balance support: Feet supported;Bilateral upper extremity supported Sitting balance-Leahy Scale: Poor Sitting balance - Comments: UE support   Standing balance support: Bilateral upper extremity supported Standing balance-Leahy Scale: Poor Standing balance comment: walker and min assist for static standing                            Cognition Arousal/Alertness: Awake/alert Behavior During Therapy: WFL for tasks assessed/performed Overall Cognitive Status: No family/caregiver present to determine baseline cognitive functioning Area of Impairment: Problem solving;Following commands;Memory                     Memory: Decreased short-term memory Following Commands: Follows one step commands consistently;Follows one step commands with increased time     Problem Solving: Slow processing;Requires verbal cues        Exercises      General Comments        Pertinent Vitals/Pain Pain Assessment: No/denies pain    Home Living  Prior Function            PT Goals (current goals can now be found in the care plan section) Progress towards PT goals: Progressing toward goals    Frequency    Min 4X/week      PT Plan Current plan remains appropriate    Co-evaluation              AM-PAC PT "6 Clicks" Mobility   Outcome  Measure  Help needed turning from your back to your side while in a flat bed without using bedrails?: A Little Help needed moving from lying on your back to sitting on the side of a flat bed without using bedrails?: A Little Help needed moving to and from a bed to a chair (including a wheelchair)?: A Little Help needed standing up from a chair using your arms (e.g., wheelchair or bedside chair)?: A Little Help needed to walk in hospital room?: A Little Help needed climbing 3-5 steps with a railing? : Total 6 Click Score: 16    End of Session Equipment Utilized During Treatment: Gait belt Activity Tolerance: Patient tolerated treatment well Patient left: in chair;with call bell/phone within reach;with chair alarm set Nurse Communication: Mobility status PT Visit Diagnosis: Other abnormalities of gait and mobility (R26.89);Muscle weakness (generalized) (M62.81);Other symptoms and signs involving the nervous system (Q22.297)     Time: 9892-1194 PT Time Calculation (min) (ACUTE ONLY): 19 min  Charges:  $Gait Training: 8-22 mins                     [facility] PT Acute Rehabilitation Services Pager [phone] Office [phone]    Angelina Ok [facility] 12/06/2020, 9:28 AM

## 2020-12-06 NOTE — H&P (Signed)
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  History taken from chart review and patient.  Patient lives alone.  1 level apartment.  Daughter and son-in-law live nearby.  Independent with assistive device prior to admission and still drives short distances.  He presented on 12/01/2020 with dysarthria, facial droop, AMS.  Head CT unremarkable for acute intracranial process, chronic atrophic and ischemic changes without acute findings.  CT angiogram head and neck no emergent large vessel occlusion.  Unchanged 60% proximal left ICA stenosis.  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  MRI of the brain showed 3 distinct 8 mm acute ischemic infarct involving the right anterior genu of the corpus callosum periventricular white matter of the right posterior corona radiata and right lentiform nucleus.  No associated hemorrhage or mass-effect.  Patient did not receive tPA.  CT angiogram chest abdomen pelvis showed occlusion of the proximal IMA at its origin, with mild perivascular fat stranding along the ventral aspect of the distal aorta and IMA origin.  Echocardiogram ejection fraction 60-65%, no wall motion abnormalities grade 1 diastolic dysfunction.  Admission chemistries unremarkable except potassium 3.4, glucose 107, BUN 24, creatinine 1.63, WBC 13,800, urine drug screen positive marijuana, troponin 47, D-dimer 1.28, sedimentation rate 27.  Currently maintained on aspirin 81 mg daily and Plavix 75 mg daily for CVA prophylaxis x3  weeks then aspirin alone.  Subcutaneous heparin for DVT prophylaxis.  Tolerating a regular consistency diet.  Therapy evaluations completed due to patient decreased functional mobility and a generalized weakness. Patient was admitted for a comprehensive rehab program.  Please see preadmission assessment from earlier today as well.  Review of Systems  Constitutional:  Negative for chills and fever.  HENT:  Negative for hearing loss.   Eyes:  Negative for blurred vision and double vision.  Respiratory:  Negative for cough and shortness of breath.   Cardiovascular:  Positive for leg swelling. Negative for chest pain and palpitations.  Gastrointestinal:  Positive for constipation. Negative for heartburn, nausea and vomiting.  Genitourinary:  Negative for dysuria and flank pain.  Musculoskeletal:  Positive for back pain and myalgias.  Skin:  Negative for rash.  Neurological:  Positive for speech change and weakness.  Psychiatric/Behavioral:  Positive for depression. The patient has insomnia.        Anxiety  All other systems reviewed and are negative. Past Medical History:  Diagnosis Date   Anxiety    Carotid artery occlusion    Depression    Hyperlipidemia    Hypertension    Stroke [facility])    Past Surgical History:  Procedure Laterality Date   ANKLE SURGERY Left    TRANSCAROTID ARTERY REVASCULARIZATION  Right 04/12/2020   Procedure: RIGHT TRANSCAROTID ARTERY REVASCULARIZATION;  Surgeon: Maeola Harman, MD;  Location: [facility] OR;  Service: Vascular;  Laterality: Right;   ULTRASOUND GUIDANCE FOR VASCULAR ACCESS Left 04/12/2020   Procedure: ULTRASOUND GUIDANCE FOR VASCULAR ACCESS, left femoral vein;  Surgeon: Maeola Harman, MD;  Location: [facility] OR;  Service: Vascular;  Laterality: Left;   No family history on file. Social  History:  reports that he has never smoked. He has never used smokeless tobacco. He reports that he does not currently use alcohol. He reports that he does  not use drugs. Allergies:  Allergies  Allergen Reactions   Trazodone Nausea And Vomiting   Medications Prior to Admission  Medication Sig Dispense Refill   amLODipine (NORVASC) 10 MG tablet Take 10 mg by mouth every morning.     aspirin EC 325 MG tablet Take 325 mg by mouth daily.     atorvastatin (LIPITOR) 80 MG tablet Take 80 mg by mouth at bedtime.     chlorthalidone (HYGROTON) 25 MG tablet Take 25 mg by mouth daily. For high blood pressure     Cholecalciferol 25 MCG (1000 UT) tablet Take 1,000 Units by mouth every morning.     clopidogrel (PLAVIX) 75 MG tablet Take 75 mg by mouth daily.     ferrous sulfate 325 (65 FE) MG tablet Take 325 mg by mouth every Monday, Wednesday, and Friday.     lidocaine (LIDODERM) 5 % Place 1 patch onto the skin daily. Remove & Discard patch within 12 hours or as directed by MD     lisinopril (ZESTRIL) 10 MG tablet Take 30 mg by mouth at bedtime.     naproxen (NAPROSYN) 500 MG tablet Take 500 mg by mouth daily as needed (back pain). Take with food and 16 oz water     PARoxetine (PAXIL) 40 MG tablet Take 20 mg by mouth every morning. For mental health     prazosin (MINIPRESS) 1 MG capsule Take 1 capsule (1 mg total) by mouth at bedtime. (Patient taking differently: Take 1 mg by mouth at bedtime. For enlarged prostate and for nightmares) 30 capsule 0   sildenafil (VIAGRA) 100 MG tablet Take 100 mg by mouth daily as needed for erectile dysfunction (do not take within 6 hours of taking prazosin).     aspirin EC 81 MG EC tablet Take 1 tablet (81 mg total) by mouth daily. Swallow whole. (Patient not taking: No sig reported) 30 tablet 11   nicotine (NICODERM CQ - DOSED IN MG/24 HOURS) 21 mg/24hr patch Place 21 mg onto the skin daily. (Patient not taking: Reported on 12/01/2020)     nicotine polacrilex (NICORETTE) 2 MG gum Take 2 mg by mouth See admin instructions. Chew one piece (2 mg) in mouth as directed for smoking cessation: Weeks 1-6 - chew one piece of gum (2 mg)  every 1-2 hours (max 24 pieces/day); to increase chances of quitting, chew at least 9 pieces/day during the first 6 weeks; weeks 7-9 - chew one piece of gum (2 mg) every 2-4 hours (max 24 pieces/day); weeks 10-12 - chew one piece of gum (2 mg) every 4-8 hours (max 24 pieces/day) (Patient not taking: Reported on 12/01/2020)     oxyCODONE-acetaminophen (PERCOCET/ROXICET) 5-325 MG tablet Take 1-2 tablets by mouth every 4 (four) hours as needed for moderate pain. (Patient not taking: No sig reported) 12 tablet 0    Drug Regimen Review  Drug regimen was reviewed and remains appropriate with no significant issues identified  Home: Home Living Family/patient expects to be discharged to:: Private residence Living Arrangements: Alone Available Help at Discharge: Family, Available PRN/intermittently Type of Home: Apartment Home Access: Elevator Home Layout: One level Bathroom Shower/Tub: Health visitor: Standard Bathroom Accessibility: Yes Home Equipment: Cane - single point, Grab bars - tub/shower, Shower seat - built in Additional Comments: daughter and son-in-law live nearby   Functional  History: Prior Function Level of Independence: Independent with assistive device(s) Comments: Per PT and confirmed: pt reports using cane, drives to son-in-law's house every day for meals  Functional Status:  Mobility: Bed Mobility Overal bed mobility: Needs Assistance Bed Mobility: Supine to Sit Supine to sit: Mod assist, HOB elevated Sit to supine: Min assist General bed mobility comments: Assist to bring legs off of bed, elevate trunk into sitting and bring hips to EOB Transfers Overall transfer level: Needs assistance Equipment used: Rolling walker (2 wheeled) Transfers: Sit to/from Stand Sit to Stand: Mod assist, +2 safety/equipment Stand pivot transfers: Mod assist, +2 safety/equipment General transfer comment: Assist to bring hips up and for  balance Ambulation/Gait Ambulation/Gait assistance: Mod assist, +2 safety/equipment Gait Distance (Feet): 50 Feet Assistive device: Rolling walker (2 wheeled) Gait Pattern/deviations: Step-to pattern, Decreased step length - right, Decreased step length - left, Shuffle, Trunk flexed General Gait Details: Assist for balance and support. Initial lt lean. Multiple verbal cues to stay closer to the walker Gait velocity: decreased Gait velocity interpretation: <1.31 ft/sec, indicative of household ambulator    ADL: ADL Overall ADL's : Needs assistance/impaired Eating/Feeding: Moderate assistance, Sitting Grooming: Wash/dry hands, Wash/dry face, Moderate assistance, Sitting Upper Body Bathing: Maximal assistance, Sitting Lower Body Bathing: Maximal assistance, Bed level Upper Body Dressing : Moderate assistance, Sitting Lower Body Dressing: Sitting/lateral leans, Minimal assistance Lower Body Dressing Details (indicate cue type and reason): Min A to don socks via figure four position sitting EOB, assist to cross LEs and intermittent cues/min guard to correct LOB sitting EOB Toilet Transfer: Moderate assistance, Stand-pivot, +2 for safety/equipment, RW Toileting- Clothing Manipulation and Hygiene: Maximal assistance, Sit to/from stand Functional mobility during ADLs: Minimal assistance, +2 for safety/equipment, Rolling walker, Cueing for sequencing, Cueing for safety General ADL Comments: Pt with improving awareness of deficits though still noted with sitting/standing balance impairments, cues needed for body mechanics to decrease fall risk during tasks.  Cognition: Cognition Overall Cognitive Status: No family/caregiver present to determine baseline cognitive functioning Orientation Level: Oriented X4 Cognition Arousal/Alertness: Lethargic (eventually aroused) Behavior During Therapy: WFL for tasks assessed/performed Overall Cognitive Status: No family/caregiver present to determine  baseline cognitive functioning Area of Impairment: Memory, Following commands, Safety/judgement, Problem solving Current Attention Level: Selective Memory: Decreased short-term memory Following Commands: Follows one step commands with increased time Safety/Judgement: Decreased awareness of deficits Awareness: Emergent Problem Solving: Slow processing, Requires verbal cues General Comments: Pt asleep on entry and initially lethargic and slow to respond. Improved once up and amb.  Physical Exam: Blood pressure (!) 158/89, pulse 72, temperature 97.9 F (36.6 C), temperature source Oral, resp. rate 20, weight 84.1 kg, SpO2 95 %. Physical Exam Constitutional:      General: He is not in acute distress.    Appearance: Normal appearance.  HENT:     Head: Normocephalic and atraumatic.     Nose: Nose normal.  Eyes:     General:        Right eye: No discharge.        Left eye: No discharge.     Extraocular Movements: Extraocular movements intact.  Cardiovascular:     Rate and Rhythm: Normal rate and regular rhythm.  Pulmonary:     Effort: Pulmonary effort is normal.     Breath sounds: Normal breath sounds.  Abdominal:     General: Bowel sounds are normal. There is no distension.     Palpations: Abdomen is soft.  Musculoskeletal:     Cervical back: Normal range of  motion and neck supple.     Comments: No edema or tenderness in extremities  Skin:    General: Skin is warm and dry.  Neurological:     Mental Status: He is alert.     Comments: Alert and oriented, except for date Makes eye contact with examiner.   Provides name and age as well as place but somewhat slow to process.   Speech is dysarthric but intelligible.   Follows simple commands. Motor: 4-/5 grossly throughout  Psychiatric:        Mood and Affect: Affect is flat.        Speech: Speech is delayed.    Results for orders placed or performed during the hospital encounter of 12/01/20 (from the past 48 hour(s))  Basic  metabolic panel     Status: Abnormal   Collection Time: 12/05/20  3:38 AM  Result Value Ref Range   Sodium 136 135 - 145 mmol/L   Potassium 3.2 (L) 3.5 - 5.1 mmol/L   Chloride 102 98 - 111 mmol/L   CO2 23 22 - 32 mmol/L   Glucose, Bld 122 (H) 70 - 99 mg/dL    Comment: Glucose reference range applies only to samples taken after fasting for at least 8 hours.   BUN 19 8 - 23 mg/dL   Creatinine, Ser 3.82 (H) 0.61 - 1.24 mg/dL   Calcium 9.6 8.9 - 50.5 mg/dL   GFR, Estimated 44 (L) >60 mL/min    Comment: (NOTE) Calculated using the CKD-EPI Creatinine Equation (2021)    Anion gap 11 5 - 15    Comment: Performed at [facility], [address], [place], [place] [address]  Glucose, capillary     Status: Abnormal   Collection Time: 12/05/20  9:40 PM  Result Value Ref Range   Glucose-Capillary 107 (H) 70 - 99 mg/dL    Comment: Glucose reference range applies only to samples taken after fasting for at least 8 hours.  Basic metabolic panel     Status: Abnormal   Collection Time: 12/06/20  1:45 AM  Result Value Ref Range   Sodium 134 (L) 135 - 145 mmol/L   Potassium 4.4 3.5 - 5.1 mmol/L   Chloride 104 98 - 111 mmol/L   CO2 21 (L) 22 - 32 mmol/L   Glucose, Bld 92 70 - 99 mg/dL    Comment: Glucose reference range applies only to samples taken after fasting for at least 8 hours.   BUN 21 8 - 23 mg/dL   Creatinine, Ser 3.41 (H) 0.61 - 1.24 mg/dL   Calcium 9.5 8.9 - 93.7 mg/dL   GFR, Estimated 52 (L) >60 mL/min    Comment: (NOTE) Calculated using the CKD-EPI Creatinine Equation (2021)    Anion gap 9 5 - 15    Comment: Performed at [facility], [address] [place], [place] [address]   No results found.     Medical Problem List and Plan: 1.  Right facial droop with dysarthria and altered mental status secondary to 3 small acute ischemic infarcts involving the right anterior genu of the corpus callosum, periventricular white matter of the right posterior corona radiata, and  right lentiform nucleus likely due to small vessel disease as well as history of CVA 2021 with residual right-sided weakness  -patient may shower  -ELOS/Goals: 10 to 15 days/supervision/min a  Admit to CIR 2.  Antithrombotics: -DVT/anticoagulation:  Pharmaceutical: Heparin  -antiplatelet therapy: Aspirin 81 mg daily and Plavix 75 mg  day x3 weeks then aspirin alone 3. Pain Management: Lidoderm patch, Robaxin as needed muscle spasms  Monitor with increased exertion 4. Mood: Paxil 20 mg daily  -antipsychotic agents: N/A 5. Neuropsych: This patient is capable of making decisions on his own behalf. 6. Skin/Wound Care: Routine skin checks 7. Fluids/Electrolytes/Nutrition: Routine in and outs  CMP ordered for tomorrow a.m. 8.  Hypertension.  Norvasc 10 mg daily, Hygroton 25 mg daily.    Monitor with increased mobility 9.  Hyperlipidemia: Lipitor 10.  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  Follow-up outpatient 11.  History of tobacco use as well as marijuana.  Urine drug screen positive marijuana.  Counseling 12.  History of hematuria/renal colic.  Continue to monitor 13.  Medical noncompliance.  Counseling   Mcarthur Rossetti Angiulli, PA-C 12/06/2020  I have personally performed a face to face diagnostic evaluation, including, but not limited to relevant history and physical exam findings, of this patient and developed relevant assessment and plan.  Additionally, I have reviewed and concur with the physician assistant's documentation above.  Maryla Morrow, MD, ABPMR  The patient's status has not changed. Any changes from the pre-admission screening or documentation from the acute chart are noted above.   Maryla Morrow, MD, ABPMR

## 2020-12-06 NOTE — Progress Notes (Signed)
He has aSTROKE TEAM PROGRESS NOTE   INTERVAL HISTORY No acute events overnight.  Patient did participate in the sleep smart study but unfortunately screen negative for sleep apnea on the overnight NOx 3 monitor and did not qualify. Patient states his is neurologically status is unchanged.   He does have a bed today and inpatient rehab and will be transferred there later today. Vitals:   12/05/20 2308 12/06/20 0345 12/06/20 0746 12/06/20 1158  BP: (!) 158/96 (!) 158/89 (!) 163/94 (!) 167/95  Pulse: 73 72 84 78  Resp: 18 20 19 15   Temp: 97.9 F (36.6 C) 97.9 F (36.6 C) 98 F (36.7 C) 98.4 F (36.9 C)  TempSrc: Oral Oral Oral Oral  SpO2: 96% 95% 97% 99%  Weight:  84.1 kg     CBC:  Recent Labs  Lab 12/01/20 0041 12/01/20 0134 12/02/20 0414 12/04/20 0101  WBC 13.8*  --  10.0 8.3  NEUTROABS 9.0*  --   --   --   HGB 16.5   < > 15.0 14.4  HCT 49.5   < > 44.0 43.8  MCV 91.5  --  91.5 91.4  PLT 354  --  304 342   < > = values in this interval not displayed.   Basic Metabolic Panel:  Recent Labs  Lab 12/03/20 0054 12/03/20 2148 12/05/20 0338 12/06/20 0145  NA 138   < > 136 134*  K 3.4*   < > 3.2* 4.4  CL 103   < > 102 104  CO2 24   < > 23 21*  GLUCOSE 100*   < > 122* 92  BUN 15   < > 19 21  CREATININE 1.16   < > 1.70* 1.48*  CALCIUM 9.1   < > 9.6 9.5  MG 2.0  --   --   --    < > = values in this interval not displayed.   Lipid Panel:  Recent Labs  Lab 12/02/20 0414  CHOL 197  TRIG 104  HDL 32*  CHOLHDL 6.2  VLDL 21  LDLCALC 02/01/21*   HgbA1c:  Recent Labs  Lab 12/02/20 0414  HGBA1C 5.6   Urine Drug Screen:  Recent Labs  Lab 12/01/20 1401  LABOPIA NONE DETECTED  COCAINSCRNUR NONE DETECTED  LABBENZ NONE DETECTED  AMPHETMU NONE DETECTED  THCU POSITIVE*  LABBARB NONE DETECTED    Alcohol Level No results for input(s): ETH in the last 168 hours.  IMAGING past 24 hours No results found.  PHYSICAL EXAM Neurological Examination Mental Status: Alert and  oriented x4, speech clear and fluent without errors .  Mild dysarthria.  And slow some occasional words Cranial Nerves: II: Temporal visual fields intact with no extinction to DSS.   III,IV, VI: EOMI. No nystagmus. No ptosis.  V,VII: Mild left facial asymmetry., facial temp sensation equal bilaterally VIII: hearing intact to voice IX,X: No hypophonia XI: Symmetric XII: Midline tongue extension Motor: Right :  Upper extremity   5/5                                      Left:     Upper extremity   5/5             Lower extremity   5/5  Lower extremity   5/5 Sensory: Temp intact throughout, bilaterally. No extinction to DSS.  Cerebellar: No ataxia  Gait: Deferred      ASSESSMENT/PLAN Alec Sanchez is an 65 y.o. male with a PMHx of stroke, carotid artery occlusion, carotid artery stenosis s/p Right TCAR on 04/12/2020 by Dr. Randie Heinz for asymptomatic high grade ICA stenosisHLD and HTN who presented via EMS to the [facility] ED on Friday afternoon after family called for concerns of a stroke. He also has had right flank pain for a couple of days and had been evaluated at urgent care for this on Thursday afternoon; urinalysis was positive for hematuria; he was diagnosed with renal colic and discharged home. Upon arrival to home yesterday, patient was reportedly noted to have some slurring of his speech and right-sided facial droop. Daughter later mentioned to IM resident that the patient's first neurological deficits were noticed on Tuesday, at which time he had drooping of face, slurred speech worse than his baseline, AMS, loss of bowel and bladder continence, slowing of movement and speech, and difficulty holding things in his right hand. Patient later endorsed to IM resident that he had fallen twice on Tuesday and Wednesday and injured his back during the latter fall. On Friday, family had tried to contact patient by phone, but he did not answer, so EMS was called. On  arrival to the ED, he was A&Ox3, appeared generally weak and reported not being able to sleep due to flank pain for a couple of days. Exam in the ED revealed that he had generalized weakness in all extremities and his speech seemed slurred. He was noted to be hypertensive, and admitted that he had not taken any of his medications on the day of presentation.  SBPs have been in the high 160's to 202 this admission.    Stroke: 3 small acute ischemic infarctions involving the right anterior genu of the corpus callosum, periventricular white matter of the right posterior corona radiata, and right lentiform nucleus which are likely due to SVD   MRI brain: Three distinct 8 mm acute ischemic infarcts involving the right anterior genu of the corpus callosum, periventricular white matter of the right posterior corona radiata, and right lentiform nucleus . No associated hemorrhage or mass effect. Scattered T2/FLAIR signal abnormality involving the subcortical aspects of the right frontal and parietal regions as above, new as compared to prior MRI from 01/31/2020. 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 Also noted is underlying age-related cerebral atrophy with advanced chronic microvascular ischemic disease, progressed from prior. 3. CTA of head and neck: No emergent large vessel occlusion. Interval right carotid stenting without residual stenosis. 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 Progressive, severe proximal left vertebral artery stenosis. Unchanged 60% proximal left ICA stenosis. Unchanged mild to moderate bilateral intracranial ICA stenoses and mild left M1 stenosis. Aortic Atherosclerosisode Stroke CT head  2D Echo ejection fraction 60 to 65%.  No cardiac source of embolism. LDL 144 HgbA1c 5.6 VTE prophylaxis - recommended, per primary team     Diet   Diet Heart Room service appropriate? Yes with Assist; Fluid  consistency: Thin   Prescribed but not taking ASA 325mg  and Plavix 75mg  as prescribed December 2021 after last stroke  Recommend continue ASA 81 mg and Plavix 75mg  with measures to improve compliance  Therapy recommendations:  CIR seen by OT and rehab coordinator today. Disposition:  TBD  Uncontrolled Hypertension Home meds:  Not taking his medications  per his admission  Hypertensive to 202 since admission, currently stable Permissive hypertension (OK if < 220/120) but gradually normalize in 5-7 days Long-term BP goal normotensive  Hyperlipidemia Home meds: lipitor 80mg  LDL 144, goal < 70  High intensity statin : Keep home lipitor  as he has not been complaint with taking it  Continue statin at discharge       Non-compliance with medication regimen Patient reports noncompliance in a ongoing way Barriers discussed with daughter and she will assist with medication management at home moving forward Home health RN for medication management at home should be considered if possible ?depression/grief  may be contributing to lack of caring about taking care of himself (grieving 3 recent deaths in the family)  Other Stroke Risk Factors Advanced Age >/= 28  History of strokes, 2021:  L basal ganglia / corona radiata infarct. Punctate L periatrial white matter infarct. Moderate small vessel disease w/ chronic lacunes and microhemorrhages. Details not available for other strokes.   Other Active Problems   Hospital day # 5  Patient presented with right subcortical lacunar infarcts from small vessel disease.   He did participate in the sleep smart study but was a screen failure as overnight NOx 3 monitor was negative for sleep apnea.  Recommend aspirin 81 mg daily and Plavix 75 mg daily for 3 weeks followed by aspirin alone.  Patient counseled to be compliant with medications maintain aggressive risk factor modification.  Transfer to inpatient rehab when bed available later today.   Greater than 50% time during this 25-minute visit was spent in counseling and coordination of care about her stroke some questions.  Stroke team will sign off.  Kindly call for questions.  Follow-up as outpatient with stroke clinic in 2 months  2022 MD  To contact Stroke Continuity provider, please refer to Delia Heady. After hours, contact General Neurology

## 2020-12-06 NOTE — Progress Notes (Signed)
Inpatient Rehabilitation Admissions Coordinator   I have CIR bed to admit him to today. I met with him at bedside and spoke with his daughter, Jonelle Sidle , by phone. They are in agreement to admit. I have notified attending service, acute team and TOC. I will make the arrangements to admit today.  Danne Baxter, RN, MSN Rehab Admissions Coordinator [phone] 12/06/2020 11:03 AM

## 2020-12-06 NOTE — Progress Notes (Signed)
Patient arrived to 5C07 in stable condition. Call bell within reach.

## 2020-12-06 NOTE — Progress Notes (Signed)
Jamse Arn, MD   Physician  Physical Medicine and Rehabilitation  PMR Pre-admission      Signed  Date of Service:  12/03/2020  4:12 PM       Related encounter: ED to Hosp-Admission (Current) from 12/01/2020 in [facility] 4E CV SURGICAL PROGRESSIVE CARE       Signed      Show:Clear all _0 Written_1 Templated_2 Copied  Added by: _3 Cristina Gong, RN_4 Lind Covert, Lauren Mamie Nick, CCC-SLP_5 Jamse Arn, MD  _6 Hover for details                                                                                                                                                                                                                                                                                                                                                                                                                                                          PMR Admission Coordinator Pre-Admission Assessment   Patient: Alec Sanchez is an 65 y.o., male MRN: [id number] DOB: 1955/08/13 Height:   Weight: 84.1 kg   Insurance Information HMO:   PPO:      PCP:      IPA:      80/20:      OTHER:  PRIMARY: [place]     Policy#: [id number]      Subscriber:  patient CM Name: Rubin Payor      Phone#: [phone]     Fax#: [phone] Pre-Cert#: [id number]  approved for 7 days    Employer:  Benefits:  Phone #: [phone]     Name:  Eff. Date: active     Deduct: none      Out of Pocket Max: none      L CIR per [facility] contract 100% Providers: in-network  SECONDARY: [employer] Medicare      Policy#: [id number]     Phone#: [phone]   Financial Counselor:       Phone#:    The "Data Collection Information Summary" for patients in Inpatient Rehabilitation Facilities with attached "Privacy Act Effie Records" was provided and verbally  reviewed with: N/A   Emergency Contact Information Contact Information       Name Relation Home Work [place], [place] Daughter     [phone]         Current Medical History  Patient Admitting Diagnosis: acute ischemic stroke   History of Present Illness:  65 year old right-handed male with history of hypertension, tobacco use, hyperlipidemia, 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  Per chart review patient lives alone.  1 level apartment.  Daughter and son-in-law live nearby.  Independent with assistive device prior to admission and still drives short distances.  Presented 12/01/2020 with slurred speech and right facial droop as well as altered mental status.  Cranial CT scan showed chronic atrophic and ischemic changes without acute findings.  CT angiogram head and neck no emergent large vessel occlusion.  Unchanged 60% proximal left ICA stenosis.  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  MRI of the brain showed 3 distinct 8 mm acute ischemic infarct involving the right anterior genu of the corpus callosum periventricular white matter of the right posterior corona radiata and right lentiform nucleus.  No associated hemorrhage or mass-effect.  Patient did not receive TPA.  CT angiogram chest abdomen pelvis showed occlusion of the proximal IMA at its origin, with mild perivascular fat stranding along the ventral aspect of the distal aorta and IMA origin.  Echocardiogram with ejection fraction of 60 to 65% no wall motion abnormalities grade 1 diastolic dysfunction.  Admission chemistries unremarkable except potassium 3.4, glucose 107, BUN 24, creatinine 1.63, WBC 13,800, urine drug screen positive marijuana, troponin 47, D-dimer 1.28,  sedimentation rate 27.  Currently maintained on aspirin 81 mg daily and Plavix 75 mg daily for CVA prophylaxis x3 weeks then aspirin alone.  Subcutaneous heparin for DVT prophylaxis.  Tolerating a regular consistency diet .   Complete NIHSS TOTAL: 0   Patient's [facility] has been reviewed by the rehabilitation admission coordinator and physician.   Past Medical History      Past Medical History:  Diagnosis Date   Anxiety     Carotid artery occlusion     Depression     Hyperlipidemia     Hypertension     Stroke [facility])      Has the patient had major surgery during 100 days prior to admission? No   Family History   family history is not on file.   Current Medications   Current Facility-Administered Medications:    acetaminophen (TYLENOL) tablet 650 mg, 650 mg, Oral, Q6H PRN,  650 mg at 12/04/20 0807 **OR** acetaminophen (TYLENOL) suppository 650 mg, 650 mg, Rectal, Q6H PRN, Gaylan Gerold, DO   amLODipine (NORVASC) tablet 10 mg, 10 mg, Oral, q morning, Iona Beard, MD, 10 mg at 12/06/20 0802   aspirin EC tablet 81 mg, 81 mg, Oral, Daily, Gaylan Gerold, DO, 81 mg at 12/06/20 0800   atorvastatin (LIPITOR) tablet 80 mg, 80 mg, Oral, QHS, Gaylan Gerold, DO, 80 mg at 12/05/20 2126   chlorthalidone (HYGROTON) tablet 25 mg, 25 mg, Oral, Daily, Lajean Manes, MD, 25 mg at 12/06/20 0800   clopidogrel (PLAVIX) tablet 75 mg, 75 mg, Oral, Daily, Gaylan Gerold, DO, 75 mg at 12/06/20 0800   heparin injection 5,000 Units, 5,000 Units, Subcutaneous, Q8H, Gaylan Gerold, DO, 5,000 Units at 12/06/20 0557   lidocaine (LIDODERM) 5 % 1 patch, 1 patch, Transdermal, Q24H, Gaylan Gerold, DO, 1 patch at 12/05/20 2130   methocarbamol (ROBAXIN) tablet 500 mg, 500 mg, Oral, Q8H PRN, Gaylan Gerold, DO, 500 mg at 12/04/20 4034   PARoxetine (PAXIL) tablet 20 mg, 20 mg, Oral, Daily, Gaylan Gerold, DO, 20 mg at 12/06/20 0800   Patients Current Diet:  Diet Order                  Diet - low  sodium heart healthy             Diet Heart Room service appropriate? Yes with Assist; Fluid consistency: Thin  Diet effective now                       Precautions / Restrictions Precautions Precautions: Fall Precaution Comments: x2 falls in day PTA Restrictions Weight Bearing Restrictions: No    Has the patient had 2 or more falls or a fall with injury in the past year? Yes   Prior Activity Level Community (5-7x/wk): drives, gets out of house almost daily   Prior Functional Level Self Care: Did the patient need help bathing, dressing, using the toilet or eating? Independent   Indoor Mobility: Did the patient need assistance with walking from room to room (with or without device)? Independent   Stairs: Did the patient need assistance with internal or external stairs (with or without device)? Independent   Functional Cognition: Did the patient need help planning regular tasks such as shopping or remembering to take medications? Independent   Patient Information Are you of Hispanic, Latino/a,or Spanish origin?: A. No, not of Hispanic, Latino/a, or Spanish origin What is your race?: B. Black or African American Do you need or want an interpreter to communicate with a doctor or health care staff?: 0. No   Patient's Response To:  Health Literacy and Transportation Is the patient able to respond to health literacy and transportation needs?: Yes Health Literacy - How often do you need to have someone help you when you read instructions, pamphlets, or other written material from your doctor or pharmacy?: Never In the past 12 months, has lack of transportation kept you from medical appointments or from getting medications?: No In the past 12 months, has lack of transportation kept you from meetings, work, or from getting things needed for daily living?: No   Development worker, international aid / [place]: [place] - single point, Grab bars - tub/shower, Civil engineer, contracting - built in    Prior Device Use: Indicate devices/aids used by the patient prior to current illness, exacerbation or injury?  cane   Current Functional Level Cognition   Overall Cognitive Status: No  family/caregiver present to determine baseline cognitive functioning Current Attention Level: Selective Orientation Level: Oriented X4 Following Commands: Follows one step commands consistently, Follows one step commands with increased time Safety/Judgement: Decreased awareness of deficits General Comments: Pt asleep on entry and initially lethargic and slow to respond. Improved once up and amb.    Extremity Assessment (includes Sensation/Coordination)   Upper Extremity Assessment: RUE deficits/detail, LUE deficits/detail RUE Deficits / Details: ataxic movements - increased time for motor planning RUE Sensation: WNL RUE Coordination: decreased fine motor, decreased gross motor LUE Deficits / Details: decreased coordination - R greater than L LUE Sensation: WNL LUE Coordination: decreased fine motor, decreased gross motor  Lower Extremity Assessment: Defer to PT evaluation RLE Deficits / Details: pt needing increased time to complete movements for MMT, able to hold against mod resistance but LOB during testing. mild coordination deficits, but pt able to complete with increased time and effort RLE Sensation: WNL RLE Coordination: decreased fine motor LLE Deficits / Details: pt needing increased time to complete movements for MMT, able to hold against min-mod resistance but LOB during testing. mild coordination deficits, but pt able to complete with increased time and effort LLE Sensation: WNL LLE Coordination: decreased fine motor     ADLs   Overall ADL's : Needs assistance/impaired Eating/Feeding: Moderate assistance, Sitting Grooming: Wash/dry hands, Wash/dry face, Moderate assistance, Sitting Upper Body Bathing: Maximal assistance, Sitting Lower Body Bathing: Maximal assistance, Bed level Upper Body  Dressing : Moderate assistance, Sitting Lower Body Dressing: Sitting/lateral leans, Minimal assistance Lower Body Dressing Details (indicate cue type and reason): Min A to don socks via figure four position sitting EOB, assist to cross LEs and intermittent cues/min guard to correct LOB sitting EOB Toilet Transfer: Moderate assistance, Stand-pivot, +2 for safety/equipment, RW Toileting- Clothing Manipulation and Hygiene: Maximal assistance, Sit to/from stand Functional mobility during ADLs: Minimal assistance, +2 for safety/equipment, Rolling walker, Cueing for sequencing, Cueing for safety General ADL Comments: Pt with improving awareness of deficits though still noted with sitting/standing balance impairments, cues needed for body mechanics to decrease fall risk during tasks.     Mobility   Overal bed mobility: Needs Assistance Bed Mobility: Supine to Sit Supine to sit: Min assist, HOB elevated Sit to supine: Min assist General bed mobility comments: Assist to elevate trunk into sitting and bring hips to EOB     Transfers   Overall transfer level: Needs assistance Equipment used: Rolling walker (2 wheeled) Transfers: Sit to/from Stand Sit to Stand: Min assist Stand pivot transfers: Mod assist, +2 safety/equipment General transfer comment: Assist to bring hips up and for balance     Ambulation / Gait / Stairs / Wheelchair Mobility   Ambulation/Gait Ambulation/Gait assistance: Mod assist, +2 safety/equipment Gait Distance (Feet): 90 Feet (90' x 1, 50' x 1) Assistive device: Rolling walker (2 wheeled) Gait Pattern/deviations: Step-to pattern, Decreased step length - right, Decreased step length - left, Shuffle, Trunk flexed General Gait Details: Assist for balance and support. As pt fatigues his steps become shorter and he lets walker get too far in front of him. Verbal cues to incr step length and stay closer to walker Gait velocity: decreased Gait velocity interpretation: <1.31 ft/sec,  indicative of household ambulator     Posture / Balance Dynamic Sitting Balance Sitting balance - Comments: UE support Balance Overall balance assessment: Needs assistance, History of Falls Sitting-balance support: Feet supported, Bilateral upper extremity supported Sitting balance-Leahy Scale: Poor Sitting balance - Comments: UE support Postural control: Left lateral lean Standing  balance support: Bilateral upper extremity supported Standing balance-Leahy Scale: Poor Standing balance comment: walker and min assist for static standing     Special needs/care consideration External Urinary Catheter    Previous Home Environment  Living Arrangements: Alone Available Help at Discharge: Family, Available PRN/intermittently Type of Home: Apartment Home Layout: One level Home Access: Building control surveyor Shower/Tub: Multimedia programmer: Standard Bathroom Accessibility: Yes How Accessible: Accessible via walker Home Care Services: Yes Type of Home Care Services: Home RN Additional Comments: daughter and son-in-law live nearby   Discharge Living Setting Plans for Discharge Living Setting: Patient's home Type of Home at Discharge: Apartment Discharge Home Layout: One level Discharge Home Access: Elevator Discharge Bathroom Shower/Tub: Walk-in shower Discharge Bathroom Toilet: Standard Discharge Bathroom Accessibility: Yes How Accessible: Accessible via walker Does the patient have any problems obtaining your medications?: No   Social/Family/Support Systems Anticipated Caregiver: Raheim Beutler, daughter Anticipated Ambulance person Information: [phone] Caregiver Availability: Intermittent Discharge Plan Discussed with Primary Caregiver: Yes Is Caregiver In Agreement with Plan?: Yes Does Caregiver/Family have Issues with Lodging/Transportation while Pt is in Rehab?: No   Goals Patient/Family Goal for Rehab: Mod I-Supervision: PT/OT Expected length of stay: 7-10  days Pt/Family Agrees to Admission and willing to participate: Yes Program Orientation Provided & Reviewed with Pt/Caregiver Including Roles  & Responsibilities: Yes   Decrease burden of Care through IP rehab admission: NA   Possible need for SNF placement upon discharge: Not anticipated   Patient Condition: I have reviewed medical records from [facility], spoken with CM, and patient and daughter. I met with patient at the bedside and discussed via phone for inpatient rehabilitation assessment.  Patient will benefit from ongoing PT and OT, can actively participate in 3 hours of therapy a day 5 days of the week, and can make measurable gains during the admission.  Patient will also benefit from the coordinated team approach during an Inpatient Acute Rehabilitation admission.  The patient will receive intensive therapy as well as Rehabilitation physician, nursing, social worker, and care management interventions.  Due to bladder management, safety, disease management, medication administration, pain management, and patient education the patient requires 24 hour a day rehabilitation nursing.  The patient is currently min to mod assist overall with mobility and basic ADLs.  Discharge setting and therapy post discharge at home with home health is anticipated.  Patient has agreed to participate in the Acute Inpatient Rehabilitation Program and will admit today.   Preadmission Screen Completed By:  Bethel Born, 12/06/2020 11:09 AM ______________________________________________________________________   Discussed status with Dr. Posey Pronto on 10/12/202 at 1108 and received approval for admission today.   Admission Coordinator:  Bethel Born, [place], time 5170 Date 12/06/2020    Assessment/Plan: Diagnosis: acute ischemic stroke Does the need for close, 24 hr/day Medical supervision in concert with the patient's rehab needs make it unreasonable for this patient to be served in a  less intensive setting? 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 Yes Does the patient require coordinated care of a physician, rehab nurse, PT, OT to address physical and functional deficits in the context of the above medical diagnosis(es)? Yes Addressing deficits in the following areas: balance, endurance, locomotion, strength, transferring, bathing, dressing, toileting, and psychosocial support Can the patient actively participate  in an intensive therapy program of at least 3 hrs of therapy 5 days a week? Yes The potential for patient to make measurable gains while on inpatient rehab is excellent Anticipated functional outcomes upon discharge from inpatient rehab: supervision PT, supervision OT, n/a SLP Estimated rehab length of stay to reach the above functional goals is: 8-12 days. Anticipated discharge destination: Home 10. Overall Rehab/Functional Prognosis: good     MD Signature: Delice Lesch, MD, ABPMR         Revision History                                    Note Details  Author Jamse Arn, MD File Time 12/06/2020 11:24 AM  Author Type Physician Status Signed  Last Editor Jamse Arn, MD Service Physical Medicine and Rehabilitation

## 2020-12-07 ENCOUNTER — Encounter (HOSPITAL_COMMUNITY): Payer: Self-pay | Admitting: Physical Medicine and Rehabilitation

## 2020-12-07 DIAGNOSIS — I639 Cerebral infarction, unspecified: Secondary | ICD-10-CM | POA: Diagnosis not present

## 2020-12-07 LAB — COMPREHENSIVE METABOLIC PANEL
ALT: 66 U/L — ABNORMAL HIGH (ref 0–44)
AST: 48 U/L — ABNORMAL HIGH (ref 15–41)
Albumin: 4 g/dL (ref 3.5–5.0)
Alkaline Phosphatase: 70 U/L (ref 38–126)
Anion gap: 10 (ref 5–15)
BUN: 23 mg/dL (ref 8–23)
CO2: 26 mmol/L (ref 22–32)
Calcium: 9.7 mg/dL (ref 8.9–10.3)
Chloride: 100 mmol/L (ref 98–111)
Creatinine, Ser: 1.59 mg/dL — ABNORMAL HIGH (ref 0.61–1.24)
GFR, Estimated: 48 mL/min — ABNORMAL LOW (ref >60)
Glucose, Bld: 105 mg/dL — ABNORMAL HIGH (ref 70–99)
Potassium: 4 mmol/L (ref 3.5–5.1)
Sodium: 136 mmol/L (ref 135–145)
Total Bilirubin: 0.8 mg/dL (ref 0.3–1.2)
Total Protein: 8 g/dL (ref 6.5–8.1)

## 2020-12-07 LAB — CBC WITH DIFFERENTIAL/PLATELET
Abs Immature Granulocytes: 0.02 10*3/uL (ref 0.00–0.07)
Basophils Absolute: 0 10*3/uL (ref 0.0–0.1)
Basophils Relative: 1 %
Eosinophils Absolute: 0.2 10*3/uL (ref 0.0–0.5)
Eosinophils Relative: 2 %
HCT: 47.7 % (ref 39.0–52.0)
Hemoglobin: 16.3 g/dL (ref 13.0–17.0)
Immature Granulocytes: 0 %
Lymphocytes Relative: 44 %
Lymphs Abs: 3.6 10*3/uL (ref 0.7–4.0)
MCH: 30.9 pg (ref 26.0–34.0)
MCHC: 34.2 g/dL (ref 30.0–36.0)
MCV: 90.5 fL (ref 80.0–100.0)
Monocytes Absolute: 0.8 10*3/uL (ref 0.1–1.0)
Monocytes Relative: 9 %
Neutro Abs: 3.6 10*3/uL (ref 1.7–7.7)
Neutrophils Relative %: 44 %
Platelets: 368 10*3/uL (ref 150–400)
RBC: 5.27 MIL/uL (ref 4.22–5.81)
RDW: 14.2 % (ref 11.5–15.5)
WBC: 8.2 10*3/uL (ref 4.0–10.5)
nRBC: 0 % (ref 0.0–0.2)

## 2020-12-07 NOTE — Progress Notes (Signed)
Physical Therapy Session Note  Patient Details  Name: Alec Sanchez MRN: [id number] Date of Birth: 1955-12-11  Today's Date: 12/07/2020 PT Individual Time: 1400-1410 PT Individual Time Calculation (min): 10 min   Short Term Goals: Week 1:  PT Short Term Goal 1 (Week 1): Pt will transfer sup to sit w/ min A PT Short Term Goal 2 (Week 1): Pt will transfer sit to stand w/ CGA w/o verbal cues for sequencing. PT Short Term Goal 3 (Week 1): Pt will amb w/ RW and CGA up to 75'  Skilled Therapeutic Interventions/Progress Updates:     Pt supine in bed upon arrival with NT taking routine vitals. With introduction, pt pleasantly refusing therapy and requests to rest, reports he promises to work tomorrow but he's too fatigued today. Spent time educating him on the benefits of mobility s/p CVA as well as general DC planning. Ended session as he was found in the bed with bed alarm on and NT in the room. Pt missed 50 minutes of skilled therapy due to refusal.    Therapy Documentation Precautions:  Restrictions Weight Bearing Restrictions: No General:    Therapy/Group: Individual Therapy  [name] 12/07/2020, 7:48 AM

## 2020-12-07 NOTE — Evaluation (Signed)
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 ([place])   Past Medical History:  Past Medical History:  Diagnosis Date   Anxiety    Carotid artery occlusion    Depression    Hyperlipidemia    Hypertension    Stroke [facility])    Past Surgical History:  Past Surgical History:  Procedure Laterality Date   ANKLE SURGERY Left    TRANSCAROTID ARTERY REVASCULARIZATION  Right 04/12/2020   Procedure: RIGHT TRANSCAROTID ARTERY REVASCULARIZATION;  Surgeon: Waynetta Sandy, MD;  Location: [place];  Service: Vascular;  Laterality: Right;   ULTRASOUND GUIDANCE FOR VASCULAR ACCESS Left 04/12/2020   Procedure: ULTRASOUND GUIDANCE FOR VASCULAR ACCESS, left femoral vein;  Surgeon: Waynetta Sandy, MD;  Location: [facility] OR;  Service: Vascular;  Laterality: Left;    Assessment & Plan Clinical Impression: Alec Sanchez is a 65 year old right-handed male with history of hypertension, tobacco use, hyperlipidemia, prior CVA 2021 with residual right side weakness maintained on aspirin and Plavix with poor medical compliance, transcarotid artery revascularization 04/12/2020 as well as recent evaluation in urgent care for modest hematuria diagnosed with renal colic.  History taken from chart review and patient.  Patient lives alone.  1 level apartment.  Daughter and son-in-law live nearby.  Independent with assistive device prior to admission and still drives short distances.  He presented on 12/01/2020 with dysarthria, facial droop, AMS.  Head CT unremarkable for  acute intracranial process, chronic atrophic and ischemic changes without acute findings.  CT angiogram head and neck no emergent large vessel occlusion.  Unchanged 60% proximal left ICA stenosis.  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  MRI of the brain showed 3 distinct 8 mm acute ischemic infarct involving the right anterior genu of the corpus callosum periventricular white matter of the right posterior corona radiata and right lentiform nucleus.  No associated hemorrhage or mass-effect.  Patient did not receive tPA.  CT angiogram chest abdomen pelvis showed occlusion of the proximal IMA at its origin, with mild perivascular fat stranding along the ventral aspect of the distal aorta and IMA origin.  Echocardiogram ejection fraction 60-65%, no wall motion abnormalities grade 1 diastolic dysfunction.  Admission chemistries unremarkable except potassium 3.4, glucose 107, BUN 24, creatinine 1.63, WBC 13,800, urine drug screen positive marijuana, troponin 47, D-dimer 1.28, sedimentation rate 27.  Currently maintained on aspirin 81 mg daily and Plavix 75 mg daily for CVA prophylaxis x3 weeks then aspirin alone.  Subcutaneous heparin for DVT prophylaxis.  Tolerating a regular consistency diet.  Therapy evaluations completed due to patient decreased functional mobility and a generalized weakness. Patient was admitted for a comprehensive rehab program.  Please see preadmission assessment from earlier today as well. Patient currently requires min with mobility secondary to muscle weakness and muscle paralysis and abnormal tone, decreased coordination, and decreased motor planning.  Prior to hospitalization, patient was modified independent  with mobility and lived with Alone in a [place] home.  Home access is  Elevator.  Patient will benefit from skilled PT intervention to maximize safe functional mobility, minimize fall risk, and decrease caregiver burden for  planned  discharge home with intermittent assist.  Anticipate patient will benefit from follow up [facility] at discharge.  PT - End of Session Activity Tolerance: Tolerates 30+ min activity with multiple rests Endurance Deficit: Yes PT Assessment Rehab Potential (ACUTE/IP ONLY): Excellent PT Barriers to Discharge: Lack of/limited family support PT Barriers to Discharge Comments: Pt lives alone, usually drives to dtr and SIL for meals. PT Patient demonstrates impairments in the following area(s): Balance;Safety;Endurance;Motor PT Transfers Functional Problem(s): Bed Mobility;Bed to Chair;Car;Furniture PT Locomotion Functional Problem(s): Ambulation;Wheelchair Mobility;Stairs PT Plan PT Intensity: Minimum of 1-2 x/day ,45 to 90 minutes PT Frequency: 5 out of 7 days PT Duration Estimated Length of Stay: 10-14 days PT Treatment/Interventions: Ambulation/gait training;Community reintegration;Neuromuscular re-education;Stair training;UE/LE Strength taining/ROM;Balance/vestibular training;Discharge planning;Therapeutic Activities;UE/LE Coordination activities;Patient/family education;Functional mobility training;Therapeutic Exercise;Wheelchair propulsion/positioning PT Transfers Anticipated Outcome(s): mod I for return to home. PT Locomotion Anticipated Outcome(s): Mod I w/ LRAD PT Recommendation Follow Up Recommendations: Home health PT Patient destination: Home Equipment Recommended: To be determined Equipment Details: Pt has SPC   PT Evaluation Precautions/Restrictions Precautions Precautions: Fall Precaution Comments: mild R hemi, posterior bias Restrictions Weight Bearing Restrictions: No General Chart Reviewed: Yes PT Amount of Missed Time (min): 50 Minutes PT Missed Treatment Reason: Patient unwilling to participate;Patient fatigue Family/Caregiver Present: No Vital SignsTherapy Vitals Temp: 98 F (36.7 C) Temp Source: Oral Pulse Rate: 76 Resp: 16 BP: (!) 150/89 Patient Position (if  appropriate): Lying Oxygen Therapy SpO2: 100 % O2 Device: Room Air Pain Pain Assessment Pain Scale: 0-10 Pain Score: 0-No pain Multiple Pain Sites: No Pain Interference Pain Interference Pain Effect on Sleep: 0. Does not apply - I have not had any pain or hurting in the past 5 days Pain Interference with Therapy Activities: 0. Does not apply - I have not received rehabilitationtherapy in the past 5 days Pain Interference with Day-to-Day Activities: 1. Rarely or not at all Home Living/Prior [place] Living Arrangements: Alone Available Help at Discharge: Family;Available PRN/intermittently Type of Home: Apartment Home Access: Elevator Home Layout: One level Bathroom Shower/Tub: Multimedia programmer: Standard Bathroom Accessibility: Yes Additional Comments: daughter and son-in-law live nearby  Lives With: Alone Prior Function Level of Independence: Independent with gait;Independent with transfers;Independent with homemaking with ambulation;Independent with basic ADLs  Able to Take Stairs?: Yes Driving: Yes Comments: Pt reports using SPC. Vision/Perception  Vision - History Ability to See in Adequate Light: 0 Adequate Perception Perception: Within Functional Limits Praxis Praxis: Impaired Praxis Impairment Details: Initiation  Cognition Overall Cognitive Status: Within Functional Limits for tasks assessed Arousal/Alertness: Awake/alert Orientation Level: Oriented X4 Year: 2022 Month: October Day of Week: Correct Memory: Appears intact Immediate Memory Recall: Sock;Blue;Bed Memory Recall Sock: Without Cue Memory Recall Blue: Without Cue Memory Recall Bed: Not able to recall Safety/Judgment: Impaired Sensation Sensation Light Touch: Appears Intact Coordination Gross Motor Movements are Fluid and Coordinated: Yes Fine Motor Movements are Fluid and Coordinated: No Finger Nose Finger Test: grossly uncoordinated, overshoots Motor   Motor Motor: Within Functional Limits Motor - Skilled Clinical Observations: slow initiation.   Trunk/Postural Assessment  Cervical Assessment Cervical Assessment: Within Functional Limits Thoracic Assessment Thoracic Assessment: Within Functional Limits Lumbar Assessment Lumbar Assessment: Within Functional Limits Postural Control Postural Control: Within Functional Limits  Balance Balance Balance Assessed: Yes Dynamic Sitting Balance Dynamic Sitting - Balance Support: Feet supported Dynamic Sitting - Level of Assistance: 5: Stand by assistance Static Standing Balance Static Standing - Balance Support: Bilateral upper extremity supported Static Standing - Level of Assistance: 4: Min  assist Extremity Assessment  RUE Assessment RUE Assessment: Exceptions to [facility] Active Range of Motion (AROM) Comments: WFL General Strength Comments: roughly 3 to 4+/5, proximal strength > distal LUE Assessment LUE Assessment: Within Functional Limits RLE Assessment RLE Assessment: Within Functional Limits General Strength Comments: grossly 4/5 LLE Assessment LLE Assessment: Within Functional Limits General Strength Comments: 5/5  Care Tool Care Tool Bed Mobility Roll left and right activity   Roll left and right assist level: Minimal Assistance - Patient > 75%    Sit to lying activity   Sit to lying assist level: Contact Guard/Touching assist    Lying to sitting on side of bed activity   Lying to sitting on side of bed assist level: the ability to move from lying on the back to sitting on the side of the bed with no back support.: Moderate Assistance - Patient 50 - 74%     Care Tool Transfers Sit to stand transfer   Sit to stand assist level: Minimal Assistance - Patient > 75%    Chair/bed transfer   Chair/bed transfer assist level: Minimal Assistance - Patient > 75%     Toilet transfer   Assist Level: Minimal Assistance - Patient > 75%    Car transfer   Car transfer assist  level: Minimal Assistance - Patient > 75%      Care Tool Locomotion Ambulation   Assist level: Minimal Assistance - Patient > 75% Assistive device: Walker-rolling Max distance: 35  Walk 10 feet activity   Assist level: Minimal Assistance - Patient > 75% Assistive device: Walker-rolling   Walk 50 feet with 2 turns activity Walk 50 feet with 2 turns activity did not occur: Safety/medical concerns      Walk 150 feet activity Walk 150 feet activity did not occur: Safety/medical concerns      Walk 10 feet on uneven surfaces activity   Assist level: Minimal Assistance - Patient > 75% Assistive device: Walker-rolling  Stairs   Assist level: Minimal Assistance - Patient > 75% Stairs assistive device: 2 hand rails Max number of stairs: 4  Walk up/down 1 step activity   Walk up/down 1 step (curb) assist level: Minimal Assistance - Patient > 75% Walk up/down 1 step or curb assistive device: 2 hand rails    Walk up/down 4 steps activity Walk up/down 4 steps assist level: Minimal Assistance - Patient > 75% Walk up/down 4 steps assistive device: 2 hand rails  Walk up/down 12 steps activity Walk up/down 12 steps activity did not occur: Safety/medical concerns      Pick up small objects from floor Pick up small object from the floor (from standing position) activity did not occur: Safety/medical concerns      Wheelchair Is the patient using a wheelchair?: Yes Type of Wheelchair: Manual   Wheelchair assist level: Maximal Assistance - Patient 25 - 49% Max wheelchair distance: 40  Wheel 50 feet with 2 turns activity   Assist Level: Total Assistance - Patient < 25%  Wheel 150 feet activity   Assist Level: Total Assistance - Patient < 25%    Refer to Care Plan for Long Term Goals  SHORT TERM GOAL WEEK 1 PT Short Term Goal 1 (Week 1): Pt will transfer sup to sit w/ min A PT Short Term Goal 2 (Week 1): Pt will transfer sit to stand w/ CGA w/o verbal cues for sequencing. PT Short Term  Goal 3 (Week 1): Pt will amb w/ RW and CGA up to 75'  Recommendations  for other services: None   Skilled Therapeutic Intervention Evaluation completed (see details above and below) with education on PT POC and goals and individual treatment initiated with focus on endurance, Neuro re-ed, gait, transfers.  Pt presents right sidelying in bed and reluctantly agreeable to participate w/ therapy.  Pt transferred from R sidelying to sit w/ Mod A 2/2 affected side.  Pt sat EOB w/ close supervision.  Pt transferred sit to stand w/ min A and SPT bed > w/c w/ same.  Pt wheeled to hallway and performed 40' of w/c mobility using BUEs but lists to right, requiring max A to maintain straight line.  PT wheeled remaining distance to small gym for time and energy conservation.  Pt amb x 35' w/ RW and min A to simulated care transfer.  Pt requires verbal cues for hand placement for initiation to RW.  Pt required min A for car transfer w/ verbal cues for safe approach.  Pt amb up and down ramped surface w/ min A and verbal cues for safety.  Pt fatigues and then R LE decreases in foot clearance and step length.  Pt wheeled to main gym and negotiated 4 steps w/ min A and B rails on 6" steps.  Pt educated on step-to gait pattern for ascent /descent.  Pt amb multiple trials of 82' w/ RW before fatiguing.  Pt returned to room and amb into room x 8' to bed.  Pt able to transfer sit to supine w/ CGA and verbal cues using side rail.  Bed alarm on and all needs in reach.   Mobility Bed Mobility Bed Mobility: Sit to Supine;Right Sidelying to Sit;Rolling Right Rolling Right: Supervision/verbal cueing Right Sidelying to Sit: Moderate Assistance - Patient 50-74% Sit to Supine: Contact Guard/Touching assist Transfers Transfers: Sit to Stand;Stand to Sit;Stand Pivot Transfers Sit to Stand: Minimal Assistance - Patient > 75% Stand to Sit: Minimal Assistance - Patient > 75% Stand Pivot Transfers: Minimal Assistance - Patient >  75% Stand Pivot Transfer Details: Verbal cues for sequencing;Verbal cues for gait pattern;Verbal cues for safe use of DME/AE Stand Pivot Transfer Details (indicate cue type and reason): cues for foot clearance, no twisting. Transfer Programmer, multimedia): Manufacturing systems engineer Ambulation: Yes Gait Assistance: Minimal Assistance - Patient > 75% Gait Distance (Feet): 30 Feet Assistive device: Rolling walker Gait Assistance Details: Verbal cues for sequencing;Verbal cues for gait pattern;Verbal cues for technique Gait Assistance Details: for improved gait cycle, walker management Gait Gait: Yes Gait Pattern: Impaired Gait Pattern: Decreased step length - left;Decreased stride length Gait velocity: decreased Stairs / Additional Locomotion Stairs: Yes Stairs Assistance: Minimal Assistance - Patient > 75% Stair Management Technique: Two rails;Forwards Number of Stairs: 4 Height of Stairs: 6 Ramp: Minimal Assistance - Patient >75% Wheelchair Mobility Wheelchair Mobility: Yes Wheelchair Assistance: Maximal Assistance - Patient 25 - 49% Wheelchair Propulsion: Both upper extremities Distance: 40   Discharge Criteria: Patient will be discharged from PT if patient refuses treatment 3 consecutive times without medical reason, if treatment goals not met, if there is a change in medical status, if patient makes no progress towards goals or if patient is discharged from hospital.  The above assessment, treatment plan, treatment alternatives and goals were discussed and mutually agreed upon: by patient  Ladoris Gene 12/07/2020, 3:31 PM

## 2020-12-07 NOTE — Plan of Care (Signed)
Problem: RH Balance Goal: LTG Patient will maintain dynamic standing with ADLs (OT) Description: LTG:  Patient will maintain dynamic standing balance with assist during activities of daily living (OT)  Flowsheets (Taken 12/07/2020 1409) LTG: Pt will maintain dynamic standing balance during ADLs with: Independent with assistive device   Problem: Sit to Stand Goal: LTG:  Patient will perform sit to stand in prep for activites of daily living with assistance level (OT) Description: LTG:  Patient will perform sit to stand in prep for activites of daily living with assistance level (OT) Flowsheets (Taken 12/07/2020 1409) LTG: PT will perform sit to stand in prep for activites of daily living with assistance level: Independent with assistive device   Problem: RH Eating Goal: LTG Patient will perform eating w/assist, cues/equip (OT) Description: LTG: Patient will perform eating with assist, with/without cues using equipment (OT) Flowsheets (Taken 12/07/2020 1409) LTG: Pt will perform eating with assistance level of: Independent with assistive device    Problem: RH Grooming Goal: LTG Patient will perform grooming w/assist,cues/equip (OT) Description: LTG: Patient will perform grooming with assist, with/without cues using equipment (OT) Flowsheets (Taken 12/07/2020 1409) LTG: Pt will perform grooming with assistance level of: Independent with assistive device    Problem: RH Bathing Goal: LTG Patient will bathe all body parts with assist levels (OT) Description: LTG: Patient will bathe all body parts with assist levels (OT) Flowsheets (Taken 12/07/2020 1409) LTG: Pt will perform bathing with assistance level/cueing: Supervision/Verbal cueing   Problem: RH Dressing Goal: LTG Patient will perform upper body dressing (OT) Description: LTG Patient will perform upper body dressing with assist, with/without cues (OT). Flowsheets (Taken 12/07/2020 1409) LTG: Pt will perform upper body dressing  with assistance level of: Independent with assistive device Goal: LTG Patient will perform lower body dressing w/assist (OT) Description: LTG: Patient will perform lower body dressing with assist, with/without cues in positioning using equipment (OT) Flowsheets (Taken 12/07/2020 1409) LTG: Pt will perform lower body dressing with assistance level of: Independent with assistive device   Problem: RH Toileting Goal: LTG Patient will perform toileting task (3/3 steps) with assistance level (OT) Description: LTG: Patient will perform toileting task (3/3 steps) with assistance level (OT)  Flowsheets (Taken 12/07/2020 1409) LTG: Pt will perform toileting task (3/3 steps) with assistance level: Independent with assistive device   Problem: RH Functional Use of Upper Extremity Goal: LTG Patient will use RT/LT upper extremity as a (OT) Description: LTG: Patient will use right/left upper extremity as a stabilizer/gross assist/diminished/nondominant/dominant level with assist, with/without cues during functional activity (OT) Flowsheets (Taken 12/07/2020 1409) LTG: Use of upper extremity in functional activities: RUE as dominant level LTG: Pt will use upper extremity in functional activity with assistance level of: Independent   Problem: RH Simple Meal Prep Goal: LTG Patient will perform simple meal prep w/assist (OT) Description: LTG: Patient will perform simple meal prep with assistance, with/without cues (OT). Flowsheets (Taken 12/07/2020 1409) LTG: Pt will perform simple meal prep with assistance level of: Independent with assistive device   Problem: RH Laundry Goal: LTG Patient will perform laundry w/assist, cues (OT) Description: LTG: Patient will perform laundry with assistance, with/without cues (OT). Flowsheets (Taken 12/07/2020 1409) LTG: Pt will perform laundry with assistance level of: Independent with assistive device   Problem: RH Toilet Transfers Goal: LTG Patient will perform  toilet transfers w/assist (OT) Description: LTG: Patient will perform toilet transfers with assist, with/without cues using equipment (OT) Flowsheets (Taken 12/07/2020 1409) LTG: Pt will perform toilet transfers with assistance  level of: Independent with assistive device   Problem: RH Tub/Shower Transfers Goal: LTG Patient will perform tub/shower transfers w/assist (OT) Description: LTG: Patient will perform tub/shower transfers with assist, with/without cues using equipment (OT) Flowsheets (Taken 12/07/2020 1409) LTG: Pt will perform tub/shower stall transfers with assistance level of: Supervision/Verbal cueing

## 2020-12-07 NOTE — Discharge Instructions (Addendum)
Inpatient Rehab Discharge Instructions  Alec Sanchez Discharge date and time: No discharge date for patient encounter.   Activities/Precautions/ Functional Status: Activity: activity as tolerated Diet: regular diet Wound Care: Routine skin checks Functional status:  ___ No restrictions     ___ Walk up steps independently ___ 24/7 supervision/assistance   ___ Walk up steps with assistance ___ Intermittent supervision/assistance  ___ Bathe/dress independently ___ Walk with walker     _x__ Bathe/dress with assistance ___ Walk Independently    ___ Shower independently ___ Walk with assistance    ___ Shower with assistance ___ No alcohol     ___ Return to work/school ________  Special Instructions: No driving smoking or alcohol  Follow-up with neurology services ambulatory referral obtained in regards to currently being maintained on Plavix as aspirin had been discontinued due to suspected GI bleed to discuss anticoagulation plan    COMMUNITY REFERRALS UPON DISCHARGE:    Home Health:   PT  & OT                  Agency: [facility]   Phone: [phone]  Medical Equipment/Items Ordered: Levan Hurst & 3 IN 1 HAS RECEIVED AT HOME 10/25                                                 Agency/Supplier:[facility] TO OB[facility] FOR PATIENT   My questions have been answered and I understand these instructions. I will adhere to these goals and the provided educational materials after my discharge from the hospital.  Patient/Caregiver Signature _______________________________ Date __________  Clinician Signature _______________________________________ Date __________  Please bring this form and your medication list with you to all your follow-up doctor's appointments.  STROKE/TIA DISCHARGE INSTRUCTIONS SMOKING Cigarette smoking nearly doubles your risk of having a stroke & is the single most alterable risk factor  If you smoke or have smoked in the last 12 months, you are advised to quit  smoking for your health. Most of the excess cardiovascular risk related to smoking disappears within a year of stopping. Ask you doctor about anti-smoking medications [place] Quit Line: 1-800-QUIT NOW Free Smoking Cessation Classes [phone]  CHOLESTEROL Know your levels; limit fat & cholesterol in your diet  Lipid Panel     Component Value Date/Time   CHOL 197 12/02/2020 0414   TRIG 104 12/02/2020 0414   HDL 32 (L) 12/02/2020 0414   CHOLHDL 6.2 12/02/2020 0414   VLDL 21 12/02/2020 0414   LDLCALC 144 (H) 12/02/2020 0414     Many patients benefit from treatment even if their cholesterol is at goal. Goal: Total Cholesterol (CHOL) less than 160 Goal:  Triglycerides (TRIG) less than 150 Goal:  HDL greater than 40 Goal:  LDL (LDLCALC) less than 100   BLOOD PRESSURE American Stroke Association blood pressure target is less that 120/80 mm/Hg  Your discharge blood pressure is:  BP: (!) 157/86 Monitor your blood pressure Limit your salt and alcohol intake Many individuals will require more than one medication for high blood pressure  DIABETES (A1c is a blood sugar average for last 3 months) Goal HGBA1c is under 7% (HBGA1c is blood sugar average for last 3 months)  Diabetes:   Lab Results  Component Value Date   HGBA1C 5.6 12/02/2020    Your HGBA1c can be lowered with medications, healthy diet, and exercise.  Check your blood sugar as directed by your physician Call your physician if you experience unexplained or low blood sugars.  PHYSICAL ACTIVITY/REHABILITATION Goal is 30 minutes at least 4 days per week  Activity: Increase activity slowly, Therapies: Physical Therapy: Home Health Return to work:  Activity decreases your risk of heart attack and stroke and makes your heart stronger.  It helps control your weight and blood pressure; helps you relax and can improve your mood. Participate in a regular exercise program. Talk with your doctor about the best form of exercise for you  (dancing, walking, swimming, cycling).  DIET/WEIGHT Goal is to maintain a healthy weight  Your discharge diet is:  Diet Order             Diet Heart Room service appropriate? Yes with Assist; Fluid consistency: Thin  Diet effective now                   liquids Your height is:  Height: 5\' 8"  (172.7 cm) Your current weight is: Weight: 85 kg Your Body Mass Index (BMI) is:  BMI (Calculated): 28.5 Following the type of diet specifically designed for you will help prevent another stroke. Your goal weight range is:   Your goal Body Mass Index (BMI) is 19-24. Healthy food habits can help reduce 3 risk factors for stroke:  High cholesterol, hypertension, and excess weight.  RESOURCES Stroke/Support Group:  Call [phone]   STROKE EDUCATION PROVIDED/REVIEWED AND GIVEN TO PATIENT Stroke warning signs and symptoms How to activate emergency medical system (call 911). Medications prescribed at discharge. Need for follow-up after discharge. Personal risk factors for stroke. Pneumonia vaccine given: No Flu vaccine given: No My questions have been answered, the writing is legible, and I understand these instructions.  I will adhere to these goals & educational materials that have been provided to me after my discharge from the hospital.

## 2020-12-07 NOTE — Progress Notes (Signed)
PROGRESS NOTE   Subjective/Complaints: No complaints this morning When asked he notes some lower back pain for which he has been receiving lidocaine patch and robaxin which are effective  ROS: +lower back pain  Objective:   No results found. Recent Labs    12/06/20 0145 12/07/20 0507  WBC 10.0 8.2  HGB 16.0 16.3  HCT 49.0 47.7  PLT 371 368   Recent Labs    12/06/20 0145 12/07/20 0507  NA 134* 136  K 4.4 4.0  CL 104 100  CO2 21* 26  GLUCOSE 92 105*  BUN 21 23  CREATININE 1.48* 1.59*  CALCIUM 9.5 9.7    Intake/Output Summary (Last 24 hours) at 12/07/2020 1248 Last data filed at 12/07/2020 0749 Gross per 24 hour  Intake 240 ml  Output 800 ml  Net -560 ml        Physical Exam: Vital Signs Blood pressure (!) 164/101, pulse 69, temperature 98.3 F (36.8 C), resp. rate 17, height 5\' 8"  (1.727 m), weight 79.5 kg, SpO2 100 %. Gen: no distress, normal appearing HEENT: oral mucosa pink and moist, NCAT Cardio: Reg rate Chest: normal effort, normal rate of breathing Abd: soft, non-distended Ext: no edema Psych: pleasant, normal affect Musculoskeletal:     Cervical back: Normal range of motion and neck supple.     Comments: No edema or tenderness in extremities  Skin:    General: Skin is warm and dry.  Neurological:     Mental Status: He is alert.     Comments: Alert and oriented, except for date Makes eye contact with examiner.   Provides name and age as well as place but somewhat slow to process.   Speech is dysarthric but intelligible.   Follows simple commands. Motor: 4-/5 grossly throughout  Psychiatric:        Mood and Affect: Affect is flat.        Speech: Speech is delayed.    Assessment/Plan: 1. Functional deficits which require 3+ hours per day of interdisciplinary therapy in a comprehensive inpatient rehab setting. Physiatrist is providing close team supervision and 24 hour management of  active medical problems listed below. Physiatrist and rehab team continue to assess barriers to discharge/monitor patient progress toward functional and medical goals  Care Tool:  Bathing              Bathing assist       Upper Body Dressing/Undressing Upper body dressing        Upper body assist Assist Level: Maximal Assistance - Patient 25 - 49%    Lower Body Dressing/Undressing Lower body dressing            Lower body assist       Toileting Toileting    Toileting assist Assist for toileting: Maximal Assistance - Patient 25 - 49%     Transfers Chair/bed transfer  Transfers assist     Chair/bed transfer assist level: Minimal Assistance - Patient > 75%     Locomotion Ambulation   Ambulation assist      Assist level: Minimal Assistance - Patient > 75% Assistive device: Walker-rolling Max distance: 35   Walk 10 feet activity  Assist     Assist level: Minimal Assistance - Patient > 75% Assistive device: Walker-rolling   Walk 50 feet activity   Assist Walk 50 feet with 2 turns activity did not occur: Safety/medical concerns         Walk 150 feet activity   Assist Walk 150 feet activity did not occur: Safety/medical concerns         Walk 10 feet on uneven surface  activity   Assist     Assist level: Minimal Assistance - Patient > 75% Assistive device: Walker-rolling   Wheelchair     Assist Is the patient using a wheelchair?: Yes Type of Wheelchair: Manual    Wheelchair assist level: Maximal Assistance - Patient 25 - 49% Max wheelchair distance: 40    Wheelchair 50 feet with 2 turns activity    Assist        Assist Level: Total Assistance - Patient < 25%   Wheelchair 150 feet activity     Assist      Assist Level: Total Assistance - Patient < 25%   Blood pressure (!) 164/101, pulse 69, temperature 98.3 F (36.8 C), resp. rate 17, height 5\' 8"  (1.727 m), weight 79.5 kg, SpO2 100 %.     Medical Problem List and Plan: 1.  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  Antithrombotics: -DVT/anticoagulation:  Pharmaceutical: Heparin             -antiplatelet therapy: Aspirin 81 mg daily and Plavix 75 mg day x3 weeks then aspirin alone 3. Low back pain: Lidoderm patch, Robaxin as needed muscle spasms. Kpad added.              Monitor with increased exertion 4. Mood: Paxil 20 mg daily             -antipsychotic agents: N/A 5. Neuropsych: This patient is capable of making decisions on his own behalf. 6. Skin/Wound Care: Routine skin checks 7. Fluids/Electrolytes/Nutrition: Routine in and outs             CMP ordered for tomorrow a.m. 8.  Hypertension.  Norvasc 10 mg daily, [place] 25 mg daily.               Monitor with increased mobility 9.  Hyperlipidemia: Lipitor 10.  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  Follow-up outpatient 11.  History of tobacco use as well as marijuana.  Urine drug screen positive marijuana.  Counseling 12.  History of hematuria/renal colic.  Continue to monitor 13.  Medical noncompliance.  Counseling 14. AKI: Cr 1.70, trending upward. Was normal 7 months ago. Repeat tomorrow. Encourage 6- 8 glasses of water per day- placed nursing order 15. Overweight: BMI 26.65: provide dietary education    LOS: 1 days A FACE TO FACE EVALUATION WAS PERFORMED  04/14/2020 Geselle Cardosa 12/07/2020, 12:48 PM

## 2020-12-07 NOTE — Progress Notes (Signed)
Inpatient Rehabilitation  Patient information reviewed and entered into eRehab system by Kavina Cantave Dawnielle Christiana, OTR/L.   Information including medical coding, functional ability and quality indicators will be reviewed and updated through discharge.    

## 2020-12-07 NOTE — Progress Notes (Signed)
Inpatient Rehabilitation Center Individual Statement of Services  Patient Name:  Alec Sanchez  Date:  12/07/2020  Welcome to the Inpatient Rehabilitation Center.  Our goal is to provide you with an individualized program based on your diagnosis and situation, designed to meet your specific needs.  With this comprehensive rehabilitation program, you will be expected to participate in at least 3 hours of rehabilitation therapies Monday-Friday, with modified therapy programming on the weekends.  Your rehabilitation program will include the following services:  Physical Therapy (PT), Occupational Therapy (OT), 24 hour per day rehabilitation nursing, Therapeutic Recreaction (TR), Neuropsychology, Care Coordinator, Rehabilitation Medicine, Nutrition Services, and Pharmacy Services  Weekly team conferences will be held on Tuesday to discuss your progress.  Your Inpatient Rehabilitation Care Coordinator will talk with you frequently to get your input and to update you on team discussions.  Team conferences with you and your family in attendance may also be held.  Expected length of stay: 10-14 days  Overall anticipated outcome: mod/I level-some supervision  Depending on your progress and recovery, your program may change. Your Inpatient Rehabilitation Care Coordinator will coordinate services and will keep you informed of any changes. Your Inpatient Rehabilitation Care Coordinator's name and contact numbers are listed  below.  The following services may also be recommended but are not provided by the Inpatient Rehabilitation Center:  Driving Evaluations Home Health Rehabiltiation Services Outpatient Rehabilitation Services    Arrangements will be made to provide these services after discharge if needed.  Arrangements include referral to agencies that provide these services.  Your insurance has been verified to be:  [employer] and [employer]-Medicare Your primary doctor is:  [place]  Pertinent information  will be shared with your doctor and your insurance company.  Inpatient Rehabilitation Care Coordinator:  Dossie Der, [place] [phone] or [phone]  Information discussed with and copy given to patient by: Lucy Chris, 12/07/2020, 1:50 PM

## 2020-12-07 NOTE — TOC CAGE-AID Note (Signed)
Transition of Care [facility]) - CAGE-AID Screening   Patient Details  Name: Alec Sanchez MRN: [id number] Date of Birth: 06/22/1955  Transition of Care [facility]) CM/SW Contact:    Devika Dragovich C Tarpley-Carter, LCSWA Phone Number: 12/07/2020, 9:22 AM   Clinical Narrative: Pt participated in Cage-Aid.  Pt stated he does not use substance or ETOH.  Pt was not offered resources, due to no usage of substance or ETOH.     Eleftherios Dudenhoeffer Tarpley-Carter, MSW, LCSW-A Pronouns:  She/Her/Hers [facility]itions of [facility] Clinical Social Worker Direct Number:  [phone] [email or web address]   CAGE-AID Screening:    Have You Ever Felt You Ought to Cut Down on Your Drinking or Drug Use?: No Have People Annoyed You By [employer] Your Drinking Or Drug Use?: No Have You Felt Bad Or Guilty About Your Drinking Or Drug Use?: No Have You Ever Had a Drink or Used Drugs First Thing In The Morning to Steady Your Nerves or to Get Rid of a Hangover?: No CAGE-AID Score: 0  Substance Abuse Education Offered: No

## 2020-12-07 NOTE — Progress Notes (Signed)
Patient ID: Alec Sanchez, male   DOB: [birth year], 65 y.o.   MRN: [id number] Met with the patient to introduce self and role of the nurse care manager. Discussed rehab team process and plan of care. Reviewed secondary risk including HTN, HLD (LDL 144) and A1C 5.6. and smoking risks. Reviewed DAPT x 3 weeks per MD then ASA only. Patient notes an understanding of the information reviewed.Continue to follow along to discharge to address educational needs and questions. Collaborate with the team to facilitate preparation for discharge. Alec Liner, RN

## 2020-12-07 NOTE — Evaluation (Signed)
Occupational Therapy Assessment and Plan  Patient Details  Name: Alec Sanchez MRN: [id number] Date of Birth: Jul 21, 1955  OT Diagnosis: abnormal posture, hemiplegia affecting dominant side, and muscle weakness (generalized) Rehab Potential:   ELOS: 10-14 days   Today's Date: 12/07/2020 OT Individual Time: 0932-3557 OT Individual Time Calculation (min): 54 min     Hospital Problem: Principal Problem:   Ischemic cerebrovascular accident (CVA) of frontal lobe ([place])   Past Medical History:  Past Medical History:  Diagnosis Date   Anxiety    Carotid artery occlusion    Depression    Hyperlipidemia    Hypertension    Stroke [facility])    Past Surgical History:  Past Surgical History:  Procedure Laterality Date   ANKLE SURGERY Left    TRANSCAROTID ARTERY REVASCULARIZATION  Right 04/12/2020   Procedure: RIGHT TRANSCAROTID ARTERY REVASCULARIZATION;  Surgeon: Waynetta Sandy, MD;  Location: [place];  Service: Vascular;  Laterality: Right;   ULTRASOUND GUIDANCE FOR VASCULAR ACCESS Left 04/12/2020   Procedure: ULTRASOUND GUIDANCE FOR VASCULAR ACCESS, left femoral vein;  Surgeon: Waynetta Sandy, MD;  Location: [facility] OR;  Service: Vascular;  Laterality: Left;    Assessment & Plan Clinical Impression: Alec Sanchez is a 65 year old right-handed male with history of hypertension, tobacco use, hyperlipidemia, prior CVA 2021 with residual right side weakness maintained on aspirin and Plavix with poor medical compliance, transcarotid artery revascularization 04/12/2020 as well as recent evaluation in urgent care for modest hematuria diagnosed with renal colic.  History taken from chart review and patient.  Patient lives alone.  1 level apartment.  Daughter and son-in-law live nearby.  Independent with assistive device prior to admission and still drives short distances.  He presented on 12/01/2020 with dysarthria, facial droop, AMS.  Head CT unremarkable for acute intracranial process,  chronic atrophic and ischemic changes without acute findings.  CT angiogram head and neck no emergent large vessel occlusion.  Unchanged 60% proximal left ICA stenosis.  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  MRI of the brain showed 3 distinct 8 mm acute ischemic infarct involving the right anterior genu of the corpus callosum periventricular white matter of the right posterior corona radiata and right lentiform nucleus.  No associated hemorrhage or mass-effect.  Patient did not receive tPA.  CT angiogram chest abdomen pelvis showed occlusion of the proximal IMA at its origin, with mild perivascular fat stranding along the ventral aspect of the distal aorta and IMA origin.  Echocardiogram ejection fraction 60-65%, no wall motion abnormalities grade 1 diastolic dysfunction.  Admission chemistries unremarkable except potassium 3.4, glucose 107, BUN 24, creatinine 1.63, WBC 13,800, urine drug screen positive marijuana, troponin 47, D-dimer 1.28, sedimentation rate 27.  Currently maintained on aspirin 81 mg daily and Plavix 75 mg daily for CVA prophylaxis x3 weeks then aspirin alone.  Subcutaneous heparin for DVT prophylaxis.  Tolerating a regular consistency diet.  Therapy evaluations completed due to patient decreased functional mobility and a generalized weakness. Patient was admitted for a comprehensive rehab program.  Please see preadmission assessment from earlier today as well. Patient transferred to CIR on 12/06/2020 .    Patient currently requires min with basic self-care skills secondary to muscle weakness, decreased cardiorespiratoy endurance, abnormal tone, unbalanced muscle activation, decreased coordination, and decreased motor planning, decreased motor planning, and decreased sitting balance, decreased standing balance, decreased postural control, hemiplegia, and decreased balance strategies.  Prior to hospitalization, patient could complete BADL and IADL  with modified independent .  Patient will benefit from skilled intervention to increase independence with basic self-care skills and increase level of independence with iADL prior to discharge home independently w/ daughter checking in.  Anticipate patient will require intermittent supervision and follow up home health.  OT - End of Session Activity Tolerance: Tolerates 30+ min activity with multiple rests Endurance Deficit: Yes OT Assessment OT Barriers to Discharge: Decreased caregiver support OT Patient demonstrates impairments in the following area(s): Balance;Cognition;Edema;Endurance;Motor;Safety OT Basic ADL's Functional Problem(s): Grooming;Bathing;Dressing;Toileting;Eating OT Advanced ADL's Functional Problem(s): Simple Meal Preparation;Laundry OT Transfers Functional Problem(s): Toilet;Tub/Shower OT Additional Impairment(s): Fuctional Use of Upper Extremity OT Plan OT Intensity: Minimum of 1-2 x/day, 45 to 90 minutes OT Frequency: 5 out of 7 days OT Duration/Estimated Length of Stay: 10-14 days OT Treatment/Interventions: Balance/vestibular training;Discharge planning;Functional electrical stimulation;Pain management;Self Care/advanced ADL retraining;Therapeutic Activities;UE/LE Coordination activities;Visual/perceptual remediation/compensation;Therapeutic Exercise;Skin care/wound managment;Patient/family education;Functional mobility training;Disease mangement/prevention;Cognitive remediation/compensation;Community reintegration;DME/adaptive equipment instruction;Neuromuscular re-education;Psychosocial support;Splinting/orthotics;UE/LE Strength taining/ROM;Wheelchair propulsion/positioning OT Self Feeding Anticipated Outcome(s): Mod I OT Basic Self-Care Anticipated Outcome(s): Mod I OT Toileting Anticipated Outcome(s): Mod I OT Bathroom Transfers Anticipated Outcome(s): Mod I OT Recommendation Patient destination: Home Follow Up Recommendations: Home health OT Equipment  Recommended: To be determined   OT Evaluation Precautions/Restrictions  Precautions Precautions: Fall Precaution Comments: mild R hemi, posterior bias Restrictions Weight Bearing Restrictions: No Pain Pain Assessment Pain Scale: 0-10 Pain Score: 0-No pain Multiple Pain Sites: No Home Living/Prior Functioning Home Living Family/patient expects to be discharged to:: Private residence Living Arrangements: Alone Available Help at Discharge: Family, Available PRN/intermittently Type of Home: Apartment Home Access: Elevator Home Layout: One level Bathroom Shower/Tub: Multimedia programmer: Standard Bathroom Accessibility: Yes Additional Comments: daughter and son-in-law live nearby  Lives With: Alone IADL History Homemaking Responsibilities: Yes Meal Prep Responsibility: Primary Laundry Responsibility: Primary Cleaning Responsibility: Primary Shopping Responsibility: Primary Prior Function Level of Independence: Independent with gait, Independent with transfers, Independent with homemaking with ambulation, Independent with basic ADLs  Able to Take Stairs?: Yes Driving: Yes Comments: Pt reports using SPC. Vision Baseline Vision/History: 0 No visual deficits Ability to See in Adequate Light: 0 Adequate Patient Visual Report: No change from baseline Vision Assessment?: No apparent visual deficits Perception  Perception: Within Functional Limits Praxis Praxis: Impaired Praxis Impairment Details: Initiation Cognition Overall Cognitive Status: Within Functional Limits for tasks assessed Arousal/Alertness: Awake/alert Orientation Level: Person;Place;Situation Person: Oriented Place: Oriented Situation: Oriented Year: 2022 Month: October Day of Week: Correct Memory: Appears intact Immediate Memory Recall: Sock;Blue;Bed Memory Recall Sock: Without Cue Memory Recall Blue: Without Cue Memory Recall Bed: Not able to recall Safety/Judgment:  Impaired Sensation Sensation Light Touch: Appears Intact Coordination Gross Motor Movements are Fluid and Coordinated: Yes Fine Motor Movements are Fluid and Coordinated: No Finger Nose Finger Test: grossly uncoordinated, overshoots Motor  Motor Motor: Within Functional Limits Motor - Skilled Clinical Observations: slow initiation.  Trunk/Postural Assessment  Cervical Assessment Cervical Assessment: Within Functional Limits Thoracic Assessment Thoracic Assessment: Within Functional Limits Lumbar Assessment Lumbar Assessment: Within Functional Limits Postural Control Postural Control: Within Functional Limits  Balance Balance Balance Assessed: Yes Dynamic Sitting Balance Dynamic Sitting - Balance Support: Feet supported Dynamic Sitting - Level of Assistance: 5: Stand by assistance Static Standing Balance Static Standing - Balance Support: Bilateral upper extremity supported Static Standing - Level of Assistance: 4: Min assist Extremity/Trunk Assessment RUE Assessment RUE Assessment: Exceptions to [facility] Active Range of Motion (AROM) Comments: WFL General Strength Comments: roughly 3 to 4+/5, proximal strength > distal LUE Assessment LUE Assessment: Within Functional Limits  Care Tool Care Tool Self Care Eating    Supervision    Oral Care    Oral Care Assist Level: Supervision/Verbal cueing    Bathing   Body parts bathed by patient: Right arm;Left arm;Chest;Abdomen;Front perineal area;Buttocks;Right upper leg;Left upper leg;Face Body parts bathed by helper: Buttocks;Right lower leg;Left lower leg   Assist Level: Moderate Assistance - Patient 50 - 74%    Upper Body Dressing(including orthotics)   What is the patient wearing?: Pull over shirt   Assist Level: Minimal Assistance - Patient > 75%    Lower Body Dressing (excluding footwear)   What is the patient wearing?: Underwear/pull up;Pants Assist for lower body dressing: Moderate Assistance - Patient 50 - 74%     Putting on/Taking off footwear   What is the patient wearing?: Non-skid slipper socks Assist for footwear: Moderate Assistance - Patient 50 - 74%       Care Tool Toileting Toileting activity   Assist for toileting: Moderate Assistance - Patient 50 - 74%     Care Tool Bed Mobility Roll left and right activity   Roll left and right assist level: Minimal Assistance - Patient > 75%    Sit to lying activity   Sit to lying assist level: Contact Guard/Touching assist    Lying to sitting on side of bed activity   Lying to sitting on side of bed assist level: the ability to move from lying on the back to sitting on the side of the bed with no back support.: Moderate Assistance - Patient 50 - 74%     Care Tool Transfers Sit to stand transfer   Sit to stand assist level: Minimal Assistance - Patient > 75%    Chair/bed transfer   Chair/bed transfer assist level: Minimal Assistance - Patient > 75%     Toilet transfer   Assist Level: Minimal Assistance - Patient > 75%     Care Tool Cognition  Expression of Ideas and Wants Expression of Ideas and Wants: 3. Some difficulty - exhibits some difficulty with expressing needs and ideas (e.g, some words or finishing thoughts) or speech is not clear  Understanding Verbal and Non-Verbal Content Understanding Verbal and Non-Verbal Content: 3. Usually understands - understands most conversations, but misses some part/intent of message. Requires cues at times to understand   Memory/Recall Ability Memory/Recall Ability : Current season;That he or she is in a hospital/hospital unit   Refer to Care Plan for [place] 1 OT Short Term Goal 1 (Week 1): Pt will perform BSC/toilet transfer CGA with LRAD OT Short Term Goal 2 (Week 1): Pt will perform LB dress with hemitechnique CGA OT Short Term Goal 3 (Week 1): Pt will perform bathing tasks at shower level CGA with AE PRN  Recommendations for other services: None    Skilled  Therapeutic Intervention ADL ADL Eating: Not assessed Grooming: Supervision/safety Upper Body Bathing: Minimal assistance Lower Body Bathing: Moderate assistance Upper Body Dressing: Minimal assistance Lower Body Dressing: Moderate assistance Toileting: Moderate assistance Toilet Transfer: Minimal assistance Toilet Transfer Method: Stand pivot Mobility  Bed Mobility Bed Mobility: Sit to Supine;Right Sidelying to Sit;Rolling Right Rolling Right: Supervision/verbal cueing Right Sidelying to Sit: Moderate Assistance - Patient 50-74% Sit to Supine: Contact Guard/Touching assist Transfers Sit to Stand: Minimal Assistance - Patient > 75% Stand to Sit: Minimal Assistance - Patient > 75%   Skilled Intervention: Pt greeted at time of session supine in bed resting, agreeable to OT session despite fatigue.  No pain throughout session. Initial part of session spent locating RW and scrub clothing for ADL tasks. Bed mobility performed supine > sit Min/Mod A. Noted to be incontinent of bladder and NT aware, assisting with linen change during ADL. Sitting EOB with mild posterior bias, stand pivot Min A bed > recliner. Recliner brought to sink surface and UB/LB bathing with Mod A overall for buttocks thoroughness and feet. UB dress Min A with cues for hemitechnique and donned brief Max A and pants Min A. Fatigued and wanting to return to bed, stand pivot recliner > bed Min A. All transfers with RW. Alarm on call bell in reach.    Discharge Criteria: Patient will be discharged from OT if patient refuses treatment 3 consecutive times without medical reason, if treatment goals not met, if there is a change in medical status, if patient makes no progress towards goals or if patient is discharged from hospital.  The above assessment, treatment plan, treatment alternatives and goals were discussed and mutually agreed upon: by patient  Viona Gilmore 12/07/2020, 12:56 PM

## 2020-12-07 NOTE — Progress Notes (Signed)
Inpatient Rehabilitation Care Coordinator Assessment and Plan Patient Details  Name: Alec Sanchez MRN: [id number] Date of Birth: 1955-06-10  Today's Date: 12/07/2020  Hospital Problems: Principal Problem:   Ischemic cerebrovascular accident (CVA) of frontal lobe [facility])  Past Medical History:  Past Medical History:  Diagnosis Date   Anxiety    Carotid artery occlusion    Depression    Hyperlipidemia    Hypertension    Stroke [facility])    Past Surgical History:  Past Surgical History:  Procedure Laterality Date   ANKLE SURGERY Left    TRANSCAROTID ARTERY REVASCULARIZATION  Right 04/12/2020   Procedure: RIGHT TRANSCAROTID ARTERY REVASCULARIZATION;  Surgeon: Maeola Harman, MD;  Location: [facility] OR;  Service: Vascular;  Laterality: Right;   ULTRASOUND GUIDANCE FOR VASCULAR ACCESS Left 04/12/2020   Procedure: ULTRASOUND GUIDANCE FOR VASCULAR ACCESS, left femoral vein;  Surgeon: Maeola Harman, MD;  Location: [facility] OR;  Service: Vascular;  Laterality: Left;   Social History:  reports that he has never smoked. He has never used smokeless tobacco. He reports that he does not currently use alcohol. He reports that he does not use drugs.  Family / Support Systems Marital Status: Widow/Widower Patient Roles: Parent Children: Tiffany-daughter [phone]  Victoria-daughter-fighting with currently Other Supports: Son who works all of the time according to pt Anticipated Caregiver: Tiffany Ability/Limitations of Caregiver: [name] works but her husband is retired, not sure if can be a caregiver for pt. He is one who wants to be alone and independent Caregiver Availability: Intermittent Family Dynamics: Close with daughter and usually is his other daughter but fighting with her. Tiffany and her husband assist with his meals, he would drive there, but aware now will no be able to drive  Social History Preferred language: English Religion: None Cultural Background: No  issues Education: HS Health Literacy - How often do you need to have someone help you when you read instructions, pamphlets, or other written material from your doctor or pharmacy?: Never Writes: Yes Employment Status: Disabled Date Retired/Disabled/Unemployed: 2021 Legal History/Current Legal Issues: No issues Guardian/Conservator: None-according to MD pt is capable of making his own decisions while here   Abuse/Neglect Abuse/Neglect Assessment Can Be Completed: Yes Physical Abuse: Denies Verbal Abuse: Denies Sexual Abuse: Denies Exploitation of patient/patient's resources: Denies Self-Neglect: Denies  Patient response to: Social Isolation - How often do you feel lonely or isolated from those around you?: Sometimes  Emotional Status Pt's affect, behavior and adjustment status: Pt is very independent and likes it this way. He is not one to ask for assistance and would rather try on his own even if it is not safe. He is stubborn and set in his ways, according to him. He plans to be independent at discharge from rehab Recent Psychosocial Issues: other health issues-right side hemiparesis from previous stroke in 2021, says drove and was independent even with this Psychiatric History: History of anxiety takes medications and feels help-he also smokes marijuana and finds this helpful Substance Abuse History: THC feels helps his anxiety aware of health issues but not sure will quit.  Patient / Family Perceptions, Expectations & Goals Pt/Family understanding of illness & functional limitations: Pt and daughter can explain his stroke and deficits. He feels he is progressing and plans to go home. He talks with the MD and feels he has a good understanding of his condition and plan moving forward. Premorbid pt/family roles/activities: father, brother, retiree, etc Anticipated changes in roles/activities/participation: resume Pt/family expectations/goals: Pt states: " I plan to  go home from here  and be independent like I've always been."  Daughter states: " I hope he does well here he is quite stubborn."  [employer]: None Premorbid Home Care/DME Agencies: Other (Comment) (has cane and tub seat) Transportation available at discharge: Pt drove PTA, daughter will need to now Is the patient able to respond to transportation needs?: Yes In the past 12 months, has lack of transportation kept you from medical appointments or from getting medications?: No In the past 12 months, has lack of transportation kept you from meetings, work, or from getting things needed for daily living?: No Resource referrals recommended: Neuropsychology  Discharge Planning Living Arrangements: Alone Support Systems: Children, Other relatives, Friends/neighbors Type of Residence: Private residence Insurance Resources: Media planner (specify) (VA and [employer] Medicare) Financial Resources: SSD Financial Screen Referred: No Living Expenses: Rent Money Management: Patient Does the patient have any problems obtaining your medications?: No Home Management: Patient daughter may help now Patient/Family Preliminary Plans: Return home with daughter and son in-law assisting, were providing meals prior to admission. He is aware he can not drive now and he reports guess they will bring the meals to me. Care Coordinator Barriers to Discharge: Decreased caregiver support, Medication compliance Care Coordinator Anticipated Follow Up Needs: HH/OP  Clinical Impression Pleasant gentleman who is quite stubborn and does what he wants to do. He takes his medications when he feels like it. His daughter does assist with meals but works and can not provide 24/7 care. His son in-law is retired but can not be there with him. Pt doesn't want someone with him and feels even if in wheelchair can manage on his own. Will await team's evaluations and see if eligible for aide services through [facility] 12/07/2020, 1:47 PM

## 2020-12-08 DIAGNOSIS — I639 Cerebral infarction, unspecified: Secondary | ICD-10-CM | POA: Diagnosis not present

## 2020-12-08 MED ORDER — CLONIDINE HCL 0.1 MG/24HR TD PTWK
0.1000 mg | MEDICATED_PATCH | TRANSDERMAL | Status: DC
  Administered 2020-12-08: 0.1 mg via TRANSDERMAL
  Filled 2020-12-08: qty 1

## 2020-12-08 NOTE — Progress Notes (Signed)
PROGRESS NOTE   Subjective/Complaints: Patient's chart reviewed- No issues reported overnight Vitals signs stable except for elevated systolic and diastolic BP Appreciate SW assistance with note for daughter's work.   ROS: +mild low back pain, denies chest pain  Objective:   No results found. Recent Labs    12/06/20 0145 12/07/20 0507  WBC 10.0 8.2  HGB 16.0 16.3  HCT 49.0 47.7  PLT 371 368   Recent Labs    12/06/20 0145 12/07/20 0507  NA 134* 136  K 4.4 4.0  CL 104 100  CO2 21* 26  GLUCOSE 92 105*  BUN 21 23  CREATININE 1.48* 1.59*  CALCIUM 9.5 9.7    Intake/Output Summary (Last 24 hours) at 12/08/2020 1214 Last data filed at 12/08/2020 0700 Gross per 24 hour  Intake 470 ml  Output 850 ml  Net -380 ml        Physical Exam: Vital Signs Blood pressure (!) 179/96, pulse 84, temperature 98.2 F (36.8 C), temperature source Oral, resp. rate 14, height 5\' 8"  (1.727 m), weight 78 kg, SpO2 99 %. Gen: no distress, normal appearing HEENT: oral mucosa pink and moist, NCAT Cardio: Reg rate Chest: normal effort, normal rate of breathing Abd: soft, non-distended Ext: no edema Psych: pleasant, normal affect Musculoskeletal:     Cervical back: Normal range of motion and neck supple. Tight cervical myofascia.     Comments: No edema or tenderness in extremities  Skin:    General: Skin is warm and dry.  Neurological:     Mental Status: He is alert.     Comments: Alert and oriented, except for date Makes eye contact with examiner.   Provides name and age as well as place but somewhat slow to process.   Speech is dysarthric but intelligible.   Follows simple commands. Motor: 4-/5 grossly throughout  Psychiatric:        Mood and Affect: Affect is flat.        Speech: Speech is delayed.    Assessment/Plan: 1. Functional deficits which require 3+ hours per day of interdisciplinary therapy in a comprehensive  inpatient rehab setting. Physiatrist is providing close team supervision and 24 hour management of active medical problems listed below. Physiatrist and rehab team continue to assess barriers to discharge/monitor patient progress toward functional and medical goals  Care Tool:  Bathing    Body parts bathed by patient: Right arm, Left arm, Chest, Abdomen, Front perineal area, Buttocks, Right upper leg, Left upper leg, Face   Body parts bathed by helper: Buttocks, Right lower leg, Left lower leg     Bathing assist Assist Level: Moderate Assistance - Patient 50 - 74%     Upper Body Dressing/Undressing Upper body dressing   What is the patient wearing?: Pull over shirt    Upper body assist Assist Level: Set up assist    Lower Body Dressing/Undressing Lower body dressing      What is the patient wearing?: Pants     Lower body assist Assist for lower body dressing: Moderate Assistance - Patient 50 - 74%     Toileting Toileting    Toileting assist Assist for toileting: Minimal Assistance - Patient >  75%     Transfers Chair/bed transfer  Transfers assist     Chair/bed transfer assist level: Minimal Assistance - Patient > 75%     Locomotion Ambulation   Ambulation assist      Assist level: Minimal Assistance - Patient > 75% Assistive device: Walker-rolling Max distance: 35   Walk 10 feet activity   Assist     Assist level: Minimal Assistance - Patient > 75% Assistive device: Walker-rolling   Walk 50 feet activity   Assist Walk 50 feet with 2 turns activity did not occur: Safety/medical concerns         Walk 150 feet activity   Assist Walk 150 feet activity did not occur: Safety/medical concerns         Walk 10 feet on uneven surface  activity   Assist     Assist level: Minimal Assistance - Patient > 75% Assistive device: Walker-rolling   Wheelchair     Assist Is the patient using a wheelchair?: Yes Type of Wheelchair:  Manual    Wheelchair assist level: Maximal Assistance - Patient 25 - 49% Max wheelchair distance: 40    Wheelchair 50 feet with 2 turns activity    Assist        Assist Level: Total Assistance - Patient < 25%   Wheelchair 150 feet activity     Assist      Assist Level: Total Assistance - Patient < 25%   Blood pressure (!) 179/96, pulse 84, temperature 98.2 F (36.8 C), temperature source Oral, resp. rate 14, height 5\' 8"  (1.727 m), weight 78 kg, SpO2 99 %.    Medical Problem List and Plan: 1.  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  Impaired mobility: -DVT/anticoagulation:  Pharmaceutical: Continue Heparin             -antiplatelet therapy: Aspirin 81 mg daily and Plavix 75 mg day x3 weeks then aspirin alone 3. Low back pain: Lidoderm patch, Robaxin as needed muscle spasms. Kpad added.              Monitor with increased exertion 4. Depression: Continue Paxil 20 mg daily             -antipsychotic agents: N/A 5. Neuropsych: This patient is capable of making decisions on his own behalf. 6. Skin/Wound Care: Routine skin checks 7. Fluids/Electrolytes/Nutrition: Routine in and outs             CMP ordered for tomorrow a.m. 8.  Hypertension.  Norvasc 10 mg daily, [place] 25 mg daily.  Add clonidine patch for HTN/low back pain             Monitor with increased mobility 9.  Hyperlipidemia: Continue Lipitor 10.  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  Follow-up outpatient 11.  History of tobacco use as well as marijuana.  Urine  drug screen positive marijuana.  Counseling 12.  History of hematuria/renal colic.  Continue to monitor 13.  Medical noncompliance.  Counseling 14. AKI: Cr 1.70, trending upward. Was normal 7 months ago. Repeat tomorrow. Encourage 6- 8 glasses of water per day- placed nursing order 15. Overweight: BMI 26.65: provide dietary education 16. Disposition: provided note for work for daughter.     LOS: 2 days A FACE TO FACE EVALUATION WAS PERFORMED  Drema Pry Nery Kalisz 12/08/2020, 12:14 PM

## 2020-12-08 NOTE — Progress Notes (Signed)
Physical Therapy Session Note  Patient Details  Name: Alec Sanchez MRN: [id number] Date of Birth: 17-Jan-1956  Today's Date: 12/08/2020  Short Term Goals: Week 1:  PT Short Term Goal 1 (Week 1): Pt will transfer sup to sit w/ min A PT Short Term Goal 2 (Week 1): Pt will transfer sit to stand w/ CGA w/o verbal cues for sequencing. PT Short Term Goal 3 (Week 1): Pt will amb w/ RW and CGA up to 75'  Skilled Therapeutic Interventions/Progress Updates:     1st session: Pt refused therapy despite encouragement. Contributes to generalized fatigue. He refused yesterday as well, and reported yesterday that he would work with PT today. Continued to refuse. He missed 60 minutes of skilled therapy due to refusal.   2nd session: Pt sleeping on arrival -awakens to voice. He refuses therapy when instructed. Brought items to room to do therapy in room to improve participation but he continued to refuse. Missed 30 minutes of therapy due to refusal - will consider reducing frequency next week to accommodate for poor participation.   Therapy Documentation Precautions:  Precautions Precautions: Fall Precaution Comments: mild R hemi, posterior bias Restrictions Weight Bearing Restrictions: No General: PT Amount of Missed Time (min): 60 Minutes + 30 min PT Missed Treatment Reason: Patient unwilling to participate  Therapy/Group: Individual Therapy  Makell Drohan P Rionna Feltes 12/08/2020, 7:29 AM

## 2020-12-08 NOTE — Progress Notes (Signed)
Occupational Therapy Session Note  Patient Details  Name: Alec Sanchez MRN: [id number] Date of Birth: 07-Mar-1955  Today's Date: 12/09/2020 OT Individual Time:  - 30 minutes missed     Short Term Goals: Week 1:  OT Short Term Goal 1 (Week 1): Pt will perform BSC/toilet transfer CGA with LRAD OT Short Term Goal 2 (Week 1): Pt will perform LB dress with hemitechnique CGA OT Short Term Goal 3 (Week 1): Pt will perform bathing tasks at shower level CGA with AE PRN  Skilled Therapeutic Interventions/Progress Updates:    Pt greeted in bed, politely refusing tx due to wanting to rest. OT offered to assist him with ADL activity or any kind of therapeutic activity to be set up in the room. Pt still politely refusing to participate. 30 minutes missed.   Therapy Documentation Precautions:  Precautions Precautions: Fall Precaution Comments: mild R hemi, posterior bias Restrictions Weight Bearing Restrictions: No General: General OT Amount of Missed Time: 30 Minutes Pain: pt denied pain   ADL: ADL Eating: Not assessed Grooming: Supervision/safety Upper Body Bathing: Minimal assistance Lower Body Bathing: Moderate assistance Upper Body Dressing: Minimal assistance Lower Body Dressing: Moderate assistance Toileting: Moderate assistance Toilet Transfer: Minimal assistance Toilet Transfer Method: Stand pivot     Therapy/Group: Individual Therapy  Kadence Mikkelson A Shonte Soderlund 12/09/2020, 12:39 PM

## 2020-12-08 NOTE — Progress Notes (Signed)
Occupational Therapy Session Note  Patient Details  Name: Alec Sanchez MRN: [id number] Date of Birth: [birth year]  Today's Date: 12/08/2020 OT Individual Time: 1300-1353 OT Individual Time Calculation (min): 53 min    Short Term Goals: Week 1:  OT Short Term Goal 1 (Week 1): Pt will perform BSC/toilet transfer CGA with LRAD OT Short Term Goal 2 (Week 1): Pt will perform LB dress with hemitechnique CGA OT Short Term Goal 3 (Week 1): Pt will perform bathing tasks at shower level CGA with AE PRN  Skilled Therapeutic Interventions/Progress Updates:     Pt received semi-reclined in bed with MD present, agreeable to therapy with encouragement as pt reports fatigue. Session focus on self-care retraining, activity tolerance, RUE NMR, BUE strengthening, BUE coordination, func transfers in prep for improved ADL/IADL/func mobility performance + decreased caregiver burden. Finished self-feeding lunch with R hand with increased time and S, required set-up A to open ice cream. Came to sitting EOB with mod A to progress BLE off bed + to lift trunk and for initiation of transfer. Donned B teds with total A and doffed/donned gripper socks with total A. Pt reports he would "fall over" if attempted. Donned shirt with S and increased time. Sit to stand with cues for split hand technique on RW and CGA to power up. Reports feeling "woozy," sat down for rest break prior to completing transfer. Stand-pivot x2 throughout session with RW and CGA. Total A w/c transport to and from gym 2/2 time management and energy conservation.  Completed 1x10 of the following with 3 lb dowel rod: forward/backward circles, B shoulder flexion, chest press, and volley ball. Additionally, able to toss ball back and forth 10 times, only dropping ball once.  Self-propelled w/c to elevators with min to mod A to prevent R veering and for efficient RUE propelling. Pt reports fatigue post session.   Pt left semi-reclined in bed with bed alarm  engaged, call bell in reach, and all immediate needs met.   Therapy Documentation Precautions:  Precautions Precautions: Fall Precaution Comments: mild R hemi, posterior bias Restrictions Weight Bearing Restrictions: No   Pain:  Ongoing R lateral neck pain, MD aware and to order k-pad ADL: See Care Tool for more details.  Therapy/Group: Individual Therapy  Volanda Napoleon MS, OTR/L  12/08/2020, 6:46 AM

## 2020-12-08 NOTE — Progress Notes (Signed)
Occupational Therapy Session Note  Patient Details  Name: Alec Sanchez MRN: [id number] Date of Birth: 10/15/1955  Today's Date: 12/08/2020 OT Individual Time: 0800-0905 OT Individual Time Calculation (min): 65 min    Short Term Goals: Week 1:  OT Short Term Goal 1 (Week 1): Pt will perform BSC/toilet transfer CGA with LRAD OT Short Term Goal 2 (Week 1): Pt will perform LB dress with hemitechnique CGA OT Short Term Goal 3 (Week 1): Pt will perform bathing tasks at shower level CGA with AE PRN  Skilled Therapeutic Interventions/Progress Updates:  Pt greeted supine in bed  agreeable to OT intervention. Session focus on BADL reeducation and standing tolerance/ balance. Pt able to transition from supine>sit with CGA. Pt completed sit<>stand from EOB with MIN A and able to ambulate to sink with rw and MIN A. Pt completed bathing from standing with overall MINA for standing balance as pt present with posterior bias in standing during dynamic reaching tasks. Pt completed UB dressing with set- up assist in standing and LB dressing with MOD A to don brief needing assist to thread brief through RLE and pull pants up to waist line. Pt stoof to complete oral care with supervision. Pt reports need to void b/b, MIN A for functional mobility from sink to BR with rW. Pt +b/b needing MIN A for toileting, however noted to urinate on floor needing total A for clean up. pt left seated on toilet with NT present for cleaning.                     Therapy Documentation Precautions:  Precautions Precautions: Fall Precaution Comments: mild R hemi, posterior bias Restrictions Weight Bearing Restrictions: No   Pain: pt reports mild pain in back, pt declined pain meds. Provided rest breaks and repositioning as needed.     Therapy/Group: Individual Therapy  Pollyann Glen [facility] 12/08/2020, 12:05 PM

## 2020-12-09 DIAGNOSIS — R7401 Elevation of levels of liver transaminase levels: Secondary | ICD-10-CM | POA: Diagnosis not present

## 2020-12-09 DIAGNOSIS — N179 Acute kidney failure, unspecified: Secondary | ICD-10-CM

## 2020-12-09 DIAGNOSIS — I10 Essential (primary) hypertension: Secondary | ICD-10-CM | POA: Diagnosis not present

## 2020-12-09 DIAGNOSIS — K5901 Slow transit constipation: Secondary | ICD-10-CM

## 2020-12-09 DIAGNOSIS — I639 Cerebral infarction, unspecified: Secondary | ICD-10-CM | POA: Diagnosis not present

## 2020-12-09 DIAGNOSIS — R0989 Other specified symptoms and signs involving the circulatory and respiratory systems: Secondary | ICD-10-CM

## 2020-12-09 MED ORDER — POLYETHYLENE GLYCOL 3350 17 G PO PACK
17.0000 g | PACK | Freq: Two times a day (BID) | ORAL | Status: DC
  Administered 2020-12-09 – 2020-12-11 (×5): 17 g via ORAL
  Filled 2020-12-09 (×6): qty 1

## 2020-12-09 NOTE — IPOC Note (Signed)
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 (HCC) Active Problems:   Transaminitis   Essential hypertension   Labile blood pressure   Slow transit constipation     Functional Problem List: Nursing Bladder, Bowel, Medication Management, Safety, Endurance, Pain  PT Balance, Safety, Endurance, Motor  OT Balance, Cognition, Edema, Endurance, Motor, Safety  SLP    TR         Basic ADL's: OT Grooming, Bathing, Dressing, Toileting, Eating     Advanced  ADL's: OT Simple Meal Preparation, Laundry     Transfers: PT Bed Mobility, Bed to Chair, Car, Occupational psychologist, Research scientist (life sciences): PT Ambulation, Psychologist, prison and probation services, Stairs     Additional Impairments: OT Fuctional Use of Upper Extremity  SLP        TR      Anticipated Outcomes Item Anticipated Outcome  Self Feeding Mod I  Swallowing      Basic self-care  Mod I  Toileting  Mod I   Bathroom Transfers Mod I  Bowel/Bladder  Manage bowel with mod I and bladder independently  Transfers  mod I for return to home.  Locomotion  Mod I w/ LRAD  Communication     Cognition     Pain  at or below level 4 with prn meds  Safety/Judgment  maintain safety w cues/reminders   Therapy Plan: PT Intensity: Minimum of 1-2 x/day ,45 to 90 minutes PT Frequency: 5 out of 7 days PT Duration Estimated Length of Stay: 10-14 days OT Intensity: Minimum of 1-2 x/day, 45 to 90 minutes OT Frequency: 5 out of 7 days OT Duration/Estimated Length of Stay: 10-14 days      Team Interventions: Nursing Interventions Bladder Management, Disease Management/Prevention, Medication Management, Discharge Planning, Pain Management, Bowel Management, Patient/Family Education  PT  interventions Ambulation/gait training, Community reintegration, Neuromuscular re-education, Stair training, UE/LE Strength taining/ROM, Warden/ranger, Discharge planning, Therapeutic Activities, UE/LE Coordination activities, Patient/family education, Functional mobility training, Therapeutic Exercise, Wheelchair propulsion/positioning  OT Interventions Warden/ranger, Discharge planning, Functional electrical stimulation, Pain management, Self Care/advanced ADL retraining, Therapeutic Activities, UE/LE Coordination activities, Visual/perceptual remediation/compensation, Therapeutic Exercise, Skin care/wound managment, Patient/family education, Functional mobility training, Disease mangement/prevention, Cognitive remediation/compensation, Community reintegration, Fish farm manager, Neuromuscular re-education, Psychosocial support, Splinting/orthotics, UE/LE Strength taining/ROM, Wheelchair propulsion/positioning  SLP Interventions    TR Interventions    SW/CM Interventions Discharge Planning, Psychosocial Support, Patient/Family Education   Barriers to Discharge MD  Medical stability and Behavior  Nursing Home environment access/layout, Decreased caregiver support, Medication compliance 1 level apt elevator entry, SPC amd SS solo; dtr nearby  PT Lack of/limited family support Pt lives alone, usually drives to dtr and SIL for meals.  OT Decreased caregiver support    SLP      SW Decreased caregiver support, Medication compliance     Team Discharge Planning: Destination: PT-Home ,OT- Home , SLP-  Projected Follow-up: PT-Home health PT, OT-  Home health OT, SLP-  Projected Equipment Needs: PT-To be determined, OT- To be determined, SLP-  Equipment Details: PT-Pt has SPC, OT-  Patient/family involved in discharge planning: PT- Patient,  OT-Patient, SLP-   MD ELOS: 10-14 days. Medical Rehab Prognosis:  Good Assessment:  65 year old right-handed male  with history of hypertension, tobacco use, hyperlipidemia, prior CVA 2021 with residual right side  weakness maintained on aspirin and Plavix with poor medical compliance, transcarotid artery revascularization 04/12/2020 as well as recent evaluation in urgent care for modest hematuria diagnosed with renal colic.  Patient lives alone.  1 level apartment.  Daughter and son-in-law live nearby.  Independent with assistive device prior to admission and still drives short distances.  He presented on 12/01/2020 with dysarthria, facial droop, AMS.  Head CT unremarkable for acute intracranial process, chronic atrophic and ischemic changes without acute findings.  CT angiogram head and neck no emergent large vessel occlusion.  Unchanged 60% proximal left ICA stenosis.  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  MRI of the brain showed 3 distinct 8 mm acute ischemic infarct involving the right anterior genu of the corpus callosum periventricular white matter of the right posterior corona radiata and right lentiform nucleus.  No associated hemorrhage or mass-effect.  Patient did not receive tPA.  CT angiogram chest abdomen pelvis showed occlusion of the proximal IMA at its origin, with mild perivascular fat stranding along the ventral aspect of the distal aorta and IMA origin.  Echocardiogram ejection fraction 60-65%, no wall motion abnormalities grade 1 diastolic dysfunction.  Admission chemistries unremarkable except potassium 3.4, glucose 107, BUN 24, creatinine 1.63, WBC 13,800, urine drug screen positive marijuana, troponin 47, D-dimer 1.28, sedimentation rate 27.  Currently maintained on aspirin 81 mg daily and Plavix 75 mg daily for CVA prophylaxis x3 weeks then aspirin alone.  Subcutaneous heparin for DVT prophylaxis.  Tolerating a regular consistency diet.  Therapy evaluations completed due to patient decreased functional mobility and a generalized weakness.  We will set goals  for mod I with PT/OT.  Due to the current state of emergency, patients may not be receiving their 3-hours of Medicare-mandated therapy.  See Team Conference Notes for weekly updates to the plan of care

## 2020-12-09 NOTE — Plan of Care (Signed)
  Problem: RH BOWEL ELIMINATION Goal: RH STG MANAGE BOWEL WITH ASSISTANCE Description: STG Manage Bowel with mod I Assistance. Outcome: Not Progressing; LBM 11/12; MD notified for laxative order; miralax started

## 2020-12-09 NOTE — Progress Notes (Signed)
Physical Therapy Session Note  Patient Details  Name: Alec Sanchez MRN: [id number] Date of Birth: 10/28/1955  Today's Date: 12/09/2020 PT Individual Time: Session 1: 1003-1059  Session 2: Pt declined PT Individual Time Calculation (min): 56 min and 0 min  Short Term Goals: Week 1:  PT Short Term Goal 1 (Week 1): Pt will transfer sup to sit w/ min A PT Short Term Goal 2 (Week 1): Pt will transfer sit to stand w/ CGA w/o verbal cues for sequencing. PT Short Term Goal 3 (Week 1): Pt will amb w/ RW and CGA up to 75'  Skilled Therapeutic Interventions/Progress Updates:  Session 1: Pt received sleeping in bed. Pt initially declined therapy stating he was tired. After encouragement, pt willing to attempt toileting and participate in session after. Thus, session focused on functional transfers in bed and to toilet, gait quality, and ambulation endurance.  Pt transferred to EOB with supervision for rolling right and minA for side>sit transfer. From sitting, pt completed sit to stand with CGA + RW. For all transfers, pt required VC for safe positioning and task sequencing. Pt then amb 62ft to toilet with CGA+RW and minimal VC for safe RW use. After toileting, pt required supervision for standing with use of rail, and then full assist for hygiene and lower body dressing. Pt then completed 2x rounds of ambulation (25 ft and 50 ft) with CGA+RW and seated rest break between. Pt noted to have gait speed of 0.48m/sec, indicating high fall risk. Pt also presented with shuffle gait, but was able to decrease shuffle with VC for large step pattern. Pt then reported urgency for BM and was assisted to toilet with same assist level as prior. After toileting, pt returned to recliner by amb 30ft with CGA + RW. Pt then completed 5 x sit to stand with rest breaks between, but progressing from CGA > Supervision. Pt ended session in recliner with alarm engaged, call bell, and all needs in reach.  Session 2: Pt received in  bed watching TV. PT greeted pt and asked about participating in session. Pt refused PT. PT explained importance of therapy and need to participate in rehabilitation. However, pt continued to refuse. PT again educated pt on need to make up time if missed. Pt declined session again.  Therapy Documentation Precautions:  Precautions Precautions: Fall Precaution Comments: mild R hemi, posterior bias Restrictions Weight Bearing Restrictions: No General: PT Amount of Missed Time (min): 30 Minutes PT Missed Treatment Reason: Patient unwilling to participate Pain: Session 1: Pain Assessment Pain Scale: 0-10 Pain Score: 0-No pain Session 2: Pain Assessment Pain Scale: 0-10 Pain Score: 0-No pain     Therapy/Group: Individual Therapy  Perrin Maltese, PT, DPT 12/09/2020, 3:42 PM

## 2020-12-09 NOTE — Progress Notes (Addendum)
PROGRESS NOTE   Subjective/Complaints: Patient seen sitting up in bed this morning.  He states he slept well overnight, but slept on his neck "wrong" and has some stiffness this morning.  ROS: Denies CP, SOB, N/V/D  Objective:   No results found. Recent Labs    12/07/20 0507  WBC 8.2  HGB 16.3  HCT 47.7  PLT 368    Recent Labs    12/07/20 0507  NA 136  K 4.0  CL 100  CO2 26  GLUCOSE 105*  BUN 23  CREATININE 1.59*  CALCIUM 9.7     Intake/Output Summary (Last 24 hours) at 12/09/2020 1109 Last data filed at 12/09/2020 0813 Gross per 24 hour  Intake 360 ml  Output 450 ml  Net -90 ml         Physical Exam: Vital Signs Blood pressure 107/80, pulse 83, temperature 98.9 F (37.2 C), temperature source Oral, resp. rate 18, height 5\' 8"  (1.727 m), weight 77.3 kg, SpO2 99 %. Constitutional: No distress . Vital signs reviewed. HENT: Normocephalic.  Atraumatic. Eyes: EOMI. No discharge. Cardiovascular: No JVD.  RRR. Respiratory: Normal effort.  No stridor.  Bilateral clear to auscultation. GI: Non-distended.  BS +. Skin: Warm and dry.  Intact. Psych: Normal mood.  Some delay. Musc: No edema in extremities.  No tenderness in extremities. Neuro: Alert Makes eye contact with examiner.   Speech is dysarthric but intelligible.   Follows simple commands. Motor: 4-/5 grossly throughout, stable  Assessment/Plan: 1. Functional deficits which require 3+ hours per day of interdisciplinary therapy in a comprehensive inpatient rehab setting. Physiatrist is providing close team supervision and 24 hour management of active medical problems listed below. Physiatrist and rehab team continue to assess barriers to discharge/monitor patient progress toward functional and medical goals  Care Tool:  Bathing    Body parts bathed by patient: Right arm, Left arm, Chest, Abdomen, Front perineal area, Buttocks, Right upper leg,  Left upper leg, Face   Body parts bathed by helper: Buttocks, Right lower leg, Left lower leg     Bathing assist Assist Level: Moderate Assistance - Patient 50 - 74%     Upper Body Dressing/Undressing Upper body dressing   What is the patient wearing?: Pull over shirt    Upper body assist Assist Level: Set up assist    Lower Body Dressing/Undressing Lower body dressing      What is the patient wearing?: Pants     Lower body assist Assist for lower body dressing: Moderate Assistance - Patient 50 - 74%     Toileting Toileting    Toileting assist Assist for toileting: Minimal Assistance - Patient > 75%     Transfers Chair/bed transfer  Transfers assist     Chair/bed transfer assist level: Minimal Assistance - Patient > 75%     Locomotion Ambulation   Ambulation assist      Assist level: Minimal Assistance - Patient > 75% Assistive device: Walker-rolling Max distance: 35   Walk 10 feet activity   Assist     Assist level: Minimal Assistance - Patient > 75% Assistive device: Walker-rolling   Walk 50 feet activity   Assist Walk 50 feet  with 2 turns activity did not occur: Safety/medical concerns         Walk 150 feet activity   Assist Walk 150 feet activity did not occur: Safety/medical concerns         Walk 10 feet on uneven surface  activity   Assist     Assist level: Minimal Assistance - Patient > 75% Assistive device: Walker-rolling   Wheelchair     Assist Is the patient using a wheelchair?: Yes Type of Wheelchair: Manual    Wheelchair assist level: Maximal Assistance - Patient 25 - 49% Max wheelchair distance: 40    Wheelchair 50 feet with 2 turns activity    Assist        Assist Level: Total Assistance - Patient < 25%   Wheelchair 150 feet activity     Assist      Assist Level: Total Assistance - Patient < 25%   Blood pressure 107/80, pulse 83, temperature 98.9 F (37.2 C), temperature source  Oral, resp. rate 18, height 5\' 8"  (1.727 m), weight 77.3 kg, SpO2 99 %.    Medical Problem List and Plan: 1.  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  Impaired mobility: -DVT/anticoagulation:  Pharmaceutical: Continue Heparin             -antiplatelet therapy: Aspirin 81 mg daily and Plavix 75 mg day x3 weeks then aspirin alone 3. Low back pain: Lidoderm patch, Robaxin as needed muscle spasms. Kpad added.              Monitor with increased exertion 4. Depression: Continue Paxil 20 mg daily             -antipsychotic agents: N/A 5. Neuropsych: This patient is capable of making decisions on his own behalf. 6. Skin/Wound Care: Routine skin checks 7. Fluids/Electrolytes/Nutrition: Routine in and outs             CMP ordered for tomorrow a.m. 8.  Hypertension.  Norvasc 10 mg daily, Hygroton 25 mg daily.  Added clonidine patch for HTN/low back pain  Labile on 10/15, monitor for trend             Monitor with increased mobility 9.  Hyperlipidemia: Continue Lipitor 10.  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  Follow-up outpatient 11.  History of tobacco use as well as marijuana.  Urine drug screen positive marijuana.  Counseling 12.  History of hematuria/renal colic.  Continue to monitor 13.  Medical noncompliance.  Counseling 14. AKI:  Creatinine 1.59 on 10/13  6- 8 glasses of water per day- placed nursing order 15. Overweight: BMI 26.65: provide dietary education 16.  Transaminitis  LFTs elevated on 10/13, labs ordered for Monday 17.  Slow transit constipation  Bowel meds increased on 10/15  LOS: 3 days [name]  Alec Sanchez 11/15 12/09/2020, 11:09 AM

## 2020-12-10 DIAGNOSIS — K5901 Slow transit constipation: Secondary | ICD-10-CM | POA: Diagnosis not present

## 2020-12-10 DIAGNOSIS — I10 Essential (primary) hypertension: Secondary | ICD-10-CM | POA: Diagnosis not present

## 2020-12-10 DIAGNOSIS — I639 Cerebral infarction, unspecified: Secondary | ICD-10-CM | POA: Diagnosis not present

## 2020-12-10 DIAGNOSIS — R7401 Elevation of levels of liver transaminase levels: Secondary | ICD-10-CM | POA: Diagnosis not present

## 2020-12-10 MED ORDER — SORBITOL 70 % SOLN
30.0000 mL | Freq: Three times a day (TID) | Status: DC
  Administered 2020-12-10 – 2020-12-11 (×4): 30 mL via ORAL
  Filled 2020-12-10 (×5): qty 30

## 2020-12-10 MED ORDER — SENNOSIDES-DOCUSATE SODIUM 8.6-50 MG PO TABS
2.0000 | ORAL_TABLET | Freq: Two times a day (BID) | ORAL | Status: DC
  Administered 2020-12-10 – 2020-12-11 (×3): 2 via ORAL
  Filled 2020-12-10 (×4): qty 2

## 2020-12-10 MED ORDER — HYDRALAZINE HCL 10 MG PO TABS
10.0000 mg | ORAL_TABLET | Freq: Once | ORAL | Status: AC
  Administered 2020-12-10: 10 mg via ORAL
  Filled 2020-12-10: qty 1

## 2020-12-10 MED ORDER — HYDRALAZINE HCL 10 MG PO TABS
10.0000 mg | ORAL_TABLET | Freq: Three times a day (TID) | ORAL | Status: DC
  Administered 2020-12-10 – 2020-12-21 (×33): 10 mg via ORAL
  Filled 2020-12-10 (×33): qty 1

## 2020-12-10 MED ORDER — ONDANSETRON HCL 4 MG PO TABS
2.0000 mg | ORAL_TABLET | Freq: Three times a day (TID) | ORAL | Status: DC | PRN
  Administered 2020-12-10 – 2020-12-12 (×2): 2 mg via ORAL
  Filled 2020-12-10 (×3): qty 1

## 2020-12-10 NOTE — Plan of Care (Signed)
  Problem: RH BOWEL ELIMINATION Goal: RH STG MANAGE BOWEL WITH ASSISTANCE Description: STG Manage Bowel with mod I Assistance. Outcome: Not Progressing; new order for laxatives

## 2020-12-10 NOTE — Progress Notes (Signed)
PROGRESS NOTE   Subjective/Complaints: Patient seen laying in bed this morning.  He states he slept well overnight.  He denies complaints.  ROS: Denies CP, SOB, N/V/D  Objective:   No results found. No results for input(s): WBC, HGB, HCT, PLT in the last 72 hours.  No results for input(s): NA, K, CL, CO2, GLUCOSE, BUN, CREATININE, CALCIUM in the last 72 hours.   Intake/Output Summary (Last 24 hours) at 12/10/2020 0917 Last data filed at 12/10/2020 0547 Gross per 24 hour  Intake 480 ml  Output 700 ml  Net -220 ml         Physical Exam: Vital Signs Blood pressure (!) 150/91, pulse 82, temperature 98.3 F (36.8 C), temperature source Oral, resp. rate 18, height 5\' 8"  (1.727 m), weight 78.1 kg, SpO2 100 %. Constitutional: No distress . Vital signs reviewed. HENT: Normocephalic.  Atraumatic. Eyes: EOMI. No discharge. Cardiovascular: No JVD.  RRR. Respiratory: Normal effort.  No stridor.  Bilateral clear to auscultation. GI: Non-distended.  BS +. Skin: Warm and dry.  Intact. Psych: Normal mood.  Normal behavior. Musc: No edema in extremities.  No tenderness in extremities. Neuro: Alert Makes eye contact with examiner.   Speech is dysarthric but intelligible, stable.   Follows simple commands. Motor: 4-/5 grossly throughout, unchanged  Assessment/Plan: 1. Functional deficits which require 3+ hours per day of interdisciplinary therapy in a comprehensive inpatient rehab setting. Physiatrist is providing close team supervision and 24 hour management of active medical problems listed below. Physiatrist and rehab team continue to assess barriers to discharge/monitor patient progress toward functional and medical goals  Care Tool:  Bathing    Body parts bathed by patient: Right arm, Left arm, Chest, Abdomen, Front perineal area, Buttocks, Right upper leg, Left upper leg, Face   Body parts bathed by helper: Buttocks,  Right lower leg, Left lower leg     Bathing assist Assist Level: Moderate Assistance - Patient 50 - 74%     Upper Body Dressing/Undressing Upper body dressing   What is the patient wearing?: Pull over shirt    Upper body assist Assist Level: Set up assist    Lower Body Dressing/Undressing Lower body dressing      What is the patient wearing?: Pants     Lower body assist Assist for lower body dressing: Moderate Assistance - Patient 50 - 74%     Toileting Toileting    Toileting assist Assist for toileting: Minimal Assistance - Patient > 75%     Transfers Chair/bed transfer  Transfers assist     Chair/bed transfer assist level: Minimal Assistance - Patient > 75%     Locomotion Ambulation   Ambulation assist      Assist level: Contact Guard/Touching assist Assistive device: Walker-rolling Max distance: 60   Walk 10 feet activity   Assist     Assist level: Contact Guard/Touching assist Assistive device: Walker-rolling   Walk 50 feet activity   Assist Walk 50 feet with 2 turns activity did not occur: Safety/medical concerns  Assist level: Contact Guard/Touching assist Assistive device: Walker-rolling    Walk 150 feet activity   Assist Walk 150 feet activity did not occur: Safety/medical concerns  Walk 10 feet on uneven surface  activity   Assist     Assist level: Minimal Assistance - Patient > 75% Assistive device: Walker-rolling   Wheelchair     Assist Is the patient using a wheelchair?: Yes Type of Wheelchair: Manual    Wheelchair assist level: Maximal Assistance - Patient 25 - 49% Max wheelchair distance: 40    Wheelchair 50 feet with 2 turns activity    Assist        Assist Level: Total Assistance - Patient < 25%   Wheelchair 150 feet activity     Assist      Assist Level: Total Assistance - Patient < 25%   Blood pressure (!) 150/91, pulse 82, temperature 98.3 F (36.8 C), temperature  source Oral, resp. rate 18, height 5\' 8"  (1.727 m), weight 78.1 kg, SpO2 100 %.    Medical Problem List and Plan: 1.  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  Impaired mobility: -DVT/anticoagulation:  Pharmaceutical: Continue Heparin             -antiplatelet therapy: Aspirin 81 mg daily and Plavix 75 mg day x3 weeks then aspirin alone 3. Low back pain: Lidoderm patch, Robaxin as needed muscle spasms. Kpad added.              Monitor with increased exertion 4. Depression: Continue Paxil 20 mg daily             -antipsychotic agents: N/A 5. Neuropsych: This patient is capable of making decisions on his own behalf. 6. Skin/Wound Care: Routine skin checks 7. Fluids/Electrolytes/Nutrition: Routine in and outs             CMP ordered for tomorrow a.m. 8.  Hypertension.  Norvasc 10 mg daily, Hygroton 25 mg daily.  Added clonidine patch for HTN/low back pain  Hydralazine 10 3 times daily started on 10/16  Elevated on 10/16             Monitor with increased mobility 9.  Hyperlipidemia: Continue Lipitor 10.  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  Follow-up outpatient 11.  History of tobacco use as well as marijuana.  Urine drug screen positive marijuana.  Counseling 12.  History of hematuria/renal colic.  Continue to monitor 13.  Medical noncompliance.  Counseling 14. AKI:  Creatinine 1.59 on 10/13, labs ordered for tomorrow 6- 8 glasses of water per day- placed nursing order 15. Overweight: BMI 26.65: provide dietary education 16.  Transaminitis  LFTs elevated on 10/13, labs ordered for tomorrow 17.  Slow transit constipation  Bowel meds increased  on 10/15, increased again on 10/16  LOS: 4 days A FACE TO FACE EVALUATION WAS PERFORMED  Euva Rundell 11/16 12/10/2020, 9:17 AM

## 2020-12-10 NOTE — Progress Notes (Signed)
Occupational Therapy Session Note  Patient Details  Name: Alec Sanchez MRN: [id number] Date of Birth: 06-30-1955  Today's Date: 12/11/2020 OT Individual Time: 1017-5102 OT Individual Time Calculation (min): 26 min  19 minutes missed  Short Term Goals: Week 1:  OT Short Term Goal 1 (Week 1): Pt will perform BSC/toilet transfer CGA with LRAD OT Short Term Goal 2 (Week 1): Pt will perform LB dress with hemitechnique CGA OT Short Term Goal 3 (Week 1): Pt will perform bathing tasks at shower level CGA with AE PRN  Skilled Therapeutic Interventions/Progress Updates:    Pt greeted in the w/c, requesting to use the restroom due to stomach pain and feeling "stopped up." Wanting some medicine from RN to help him void. Per RN, pt had 5 BMs yesterday and 2 this AM. Informed pt of this with pt seeming surprised but still wanting to use the restroom. CGA for ambulatory transfer to the toilet using RW. Pt with +BM void. CGA for toileting tasks, pt performing his own hygiene with vcs. Pt then ambulated to the sink to complete hand washing, cues to turn off the faucet before heading back to bed. He wanted to return to bed to rest due to stomach discomfort, stated that using the restroom helped "a little" but he was still having a stomachache. Informed RN. Left pt in bed with all needs within reach and bed alarm set, tx time missed due to pt not feeling well.   Therapy Documentation Precautions:  Precautions Precautions: Fall Precaution Comments: mild R hemi, posterior bias Restrictions Weight Bearing Restrictions: No  Pain: pt only c/o stomach pain, addressed as written above Pain Assessment Pain Scale: 0-10 Pain Score: 0-No pain ADL: ADL Eating: Not assessed Grooming: Supervision/safety Upper Body Bathing: Minimal assistance Lower Body Bathing: Moderate assistance Upper Body Dressing: Minimal assistance Lower Body Dressing: Moderate assistance Toileting: Moderate assistance Toilet Transfer:  Minimal assistance Toilet Transfer Method: Stand pivot    Therapy/Group: Individual Therapy  Merlina Marchena A Mickeal Daws 12/11/2020, 12:53 PM

## 2020-12-10 NOTE — Progress Notes (Addendum)
MD notified of patient's bp 157/106-155/109. C/o nausea. New orders noted.

## 2020-12-11 DIAGNOSIS — I639 Cerebral infarction, unspecified: Secondary | ICD-10-CM | POA: Diagnosis not present

## 2020-12-11 LAB — COMPREHENSIVE METABOLIC PANEL
ALT: 54 U/L — ABNORMAL HIGH (ref 0–44)
AST: 25 U/L (ref 15–41)
Albumin: 4.3 g/dL (ref 3.5–5.0)
Alkaline Phosphatase: 76 U/L (ref 38–126)
Anion gap: 12 (ref 5–15)
BUN: 36 mg/dL — ABNORMAL HIGH (ref 8–23)
CO2: 26 mmol/L (ref 22–32)
Calcium: 10.1 mg/dL (ref 8.9–10.3)
Chloride: 96 mmol/L — ABNORMAL LOW (ref 98–111)
Creatinine, Ser: 1.79 mg/dL — ABNORMAL HIGH (ref 0.61–1.24)
GFR, Estimated: 41 mL/min — ABNORMAL LOW (ref >60)
Glucose, Bld: 123 mg/dL — ABNORMAL HIGH (ref 70–99)
Potassium: 3.6 mmol/L (ref 3.5–5.1)
Sodium: 134 mmol/L — ABNORMAL LOW (ref 135–145)
Total Bilirubin: 0.8 mg/dL (ref 0.3–1.2)
Total Protein: 8.7 g/dL — ABNORMAL HIGH (ref 6.5–8.1)

## 2020-12-11 LAB — CBC WITH DIFFERENTIAL/PLATELET
Abs Immature Granulocytes: 0.04 10*3/uL (ref 0.00–0.07)
Basophils Absolute: 0.1 10*3/uL (ref 0.0–0.1)
Basophils Relative: 0 %
Eosinophils Absolute: 0 10*3/uL (ref 0.0–0.5)
Eosinophils Relative: 0 %
HCT: 51.3 % (ref 39.0–52.0)
Hemoglobin: 17.3 g/dL — ABNORMAL HIGH (ref 13.0–17.0)
Immature Granulocytes: 0 %
Lymphocytes Relative: 22 %
Lymphs Abs: 3.1 10*3/uL (ref 0.7–4.0)
MCH: 30.7 pg (ref 26.0–34.0)
MCHC: 33.7 g/dL (ref 30.0–36.0)
MCV: 91.1 fL (ref 80.0–100.0)
Monocytes Absolute: 1.4 10*3/uL — ABNORMAL HIGH (ref 0.1–1.0)
Monocytes Relative: 10 %
Neutro Abs: 9.4 10*3/uL — ABNORMAL HIGH (ref 1.7–7.7)
Neutrophils Relative %: 68 %
Platelets: 423 10*3/uL — ABNORMAL HIGH (ref 150–400)
RBC: 5.63 MIL/uL (ref 4.22–5.81)
RDW: 14.1 % (ref 11.5–15.5)
WBC: 14 10*3/uL — ABNORMAL HIGH (ref 4.0–10.5)
nRBC: 0 % (ref 0.0–0.2)

## 2020-12-11 MED ORDER — SODIUM CHLORIDE 0.9 % IV SOLN: INTRAVENOUS | Status: DC

## 2020-12-11 MED ORDER — PANTOPRAZOLE SODIUM 40 MG IV SOLR
40.0000 mg | Freq: Two times a day (BID) | INTRAVENOUS | Status: DC
  Administered 2020-12-11 – 2020-12-12 (×2): 40 mg via INTRAVENOUS
  Filled 2020-12-11 (×2): qty 40

## 2020-12-11 MED ORDER — SODIUM CHLORIDE 0.9 % IV BOLUS
250.0000 mL | Freq: Once | INTRAVENOUS | Status: AC
  Administered 2020-12-11: 250 mL via INTRAVENOUS

## 2020-12-11 MED ORDER — CLONIDINE HCL 0.2 MG/24HR TD PTWK: 0.2000 mg | MEDICATED_PATCH | TRANSDERMAL | Status: DC

## 2020-12-11 MED ORDER — POTASSIUM CHLORIDE 20 MEQ PO PACK
40.0000 meq | PACK | Freq: Once | ORAL | Status: AC
  Administered 2020-12-11: 40 meq via ORAL
  Filled 2020-12-11: qty 2

## 2020-12-11 NOTE — Progress Notes (Signed)
PROGRESS NOTE   Subjective/Complaints: Expressed no new complaints to me this morning, but noted to be more fatigued. Cr elevated further to 1.79- NS 250cc bolus ordered and we will repeat tomorrow. BP meds added over weekend.  ROS: Denies CP, SOB, +nausea reported as per nursing notes  Objective:   No results found. No results for input(s): WBC, HGB, HCT, PLT in the last 72 hours.  Recent Labs    12/11/20 0601  NA 134*  K 3.6  CL 96*  CO2 26  GLUCOSE 123*  BUN 36*  CREATININE 1.79*  CALCIUM 10.1    Intake/Output Summary (Last 24 hours) at 12/11/2020 1248 Last data filed at 12/11/2020 0755 Gross per 24 hour  Intake 120 ml  Output 100 ml  Net 20 ml        Physical Exam: Vital Signs Blood pressure (!) 157/109, pulse 89, temperature 98.3 F (36.8 C), temperature source Oral, resp. rate 18, height 5\' 8"  (1.727 m), weight 76.7 kg, SpO2 99 %. Gen: no distress, normal appearing HEENT: oral mucosa pink and moist, NCAT Cardio: Reg rate Chest: normal effort, normal rate of breathing Abd: soft, non-distended Ext: no edema Psych: Normal mood.  Normal behavior. Musc: No edema in extremities.  No tenderness in extremities. Neuro: Alert Makes eye contact with examiner.   Speech is dysarthric but intelligible, stable.   Follows simple commands. Motor: 4-/5 grossly throughout, unchanged  Assessment/Plan: 1. Functional deficits which require 3+ hours per day of interdisciplinary therapy in a comprehensive inpatient rehab setting. Physiatrist is providing close team supervision and 24 hour management of active medical problems listed below. Physiatrist and rehab team continue to assess barriers to discharge/monitor patient progress toward functional and medical goals  Care Tool:  Bathing    Body parts bathed by patient: Right arm, Left arm, Chest, Abdomen, Front perineal area, Buttocks, Right upper leg, Left upper  leg, Face   Body parts bathed by helper: Buttocks, Right lower leg, Left lower leg     Bathing assist Assist Level: Moderate Assistance - Patient 50 - 74%     Upper Body Dressing/Undressing Upper body dressing   What is the patient wearing?: Pull over shirt    Upper body assist Assist Level: Set up assist    Lower Body Dressing/Undressing Lower body dressing      What is the patient wearing?: Pants     Lower body assist Assist for lower body dressing: Moderate Assistance - Patient 50 - 74%     Toileting Toileting    Toileting assist Assist for toileting: Minimal Assistance - Patient > 75%     Transfers Chair/bed transfer  Transfers assist     Chair/bed transfer assist level: Minimal Assistance - Patient > 75%     Locomotion Ambulation   Ambulation assist      Assist level: Contact Guard/Touching assist Assistive device: Walker-rolling Max distance: 60   Walk 10 feet activity   Assist     Assist level: Contact Guard/Touching assist Assistive device: Walker-rolling   Walk 50 feet activity   Assist Walk 50 feet with 2 turns activity did not occur: Safety/medical concerns  Assist level: Contact Guard/Touching assist Assistive device:  Walker-rolling    Walk 150 feet activity   Assist Walk 150 feet activity did not occur: Safety/medical concerns         Walk 10 feet on uneven surface  activity   Assist     Assist level: Minimal Assistance - Patient > 75% Assistive device: Walker-rolling   Wheelchair     Assist Is the patient using a wheelchair?: Yes Type of Wheelchair: Manual    Wheelchair assist level: Maximal Assistance - Patient 25 - 49% Max wheelchair distance: 40    Wheelchair 50 feet with 2 turns activity    Assist        Assist Level: Total Assistance - Patient < 25%   Wheelchair 150 feet activity     Assist      Assist Level: Total Assistance - Patient < 25%   Blood pressure (!) 157/109, pulse  89, temperature 98.3 F (36.8 C), temperature source Oral, resp. rate 18, height 5\' 8"  (1.727 m), weight 76.7 kg, SpO2 99 %.    Medical Problem List and Plan: 1.  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  Impaired mobility: -DVT/anticoagulation:  Pharmaceutical: Continue Heparin             -antiplatelet therapy: Aspirin 81 mg daily and Plavix 75 mg day x3 weeks then aspirin alone 3. Low back pain: Lidoderm patch, Robaxin as needed muscle spasms. Kpad added.              Monitor with increased exertion 4. Depression: Continue Paxil 20 mg daily             -antipsychotic agents: N/A 5. Neuropsych: This patient is capable of making decisions on his own behalf. 6. Skin/Wound Care: Routine skin checks 7. Fluids/Electrolytes/Nutrition: Routine in and outs             CMP ordered for tomorrow a.m. 8.  Hypertension.  Norvasc 10 mg daily, Hygroton 25 mg daily.  Added clonidine patch for HTN/low back pain  Hydralazine 10 3 times daily started on 10/16  Elevated on 10/17. Clonidine increased to 0.2mg              Monitor with increased mobility 9.  Hyperlipidemia: Continue Lipitor 10.  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  Follow-up outpatient 11.  History of tobacco use as well as marijuana.  Urine drug screen positive marijuana.  Counseling 12.  History of hematuria/renal colic.  Continue to monitor 13.  Medical noncompliance.  Counseling 14. AKI:  Uptrending, 250cc bolus today and repeat tomorrow.  6- 8 glasses of water per day- placed nursing order 15. Overweight: BMI 26.65: provide dietary education 16.  Transaminitis  Downtrending, discussed with  patient.  17.  Slow transit constipation  Bowel meds increased on 10/15, increased again on 10/16  LOS: 5 days A FACE TO FACE EVALUATION WAS PERFORMED  11/16 Truong Delcastillo 12/11/2020, 12:48 PM

## 2020-12-11 NOTE — Progress Notes (Signed)
Physical Therapy Session Note  Patient Details  Name: Alec Sanchez MRN: [id number] Date of Birth: December 15, 1955  Today's Date: 12/11/2020 PT Individual Time: 1400-1418 PT Individual Time Calculation (min): 18 min  and Today's Date: 12/11/2020 PT Missed Time: 42 Minutes Missed Time Reason: Patient unwilling to participate;Pain  Short Term Goals: Week 1:  PT Short Term Goal 1 (Week 1): Pt will transfer sup to sit w/ min A PT Short Term Goal 2 (Week 1): Pt will transfer sit to stand w/ CGA w/o verbal cues for sequencing. PT Short Term Goal 3 (Week 1): Pt will amb w/ RW and CGA up to 75'  Skilled Therapeutic Interventions/Progress Updates:  Pt received supine in bed, reported 10/10 pain in abdomen and refused PT. However, pt had incontinent episode in brief and requested to be cleaned. Pt performed multiple rolls to L & R w/use of bedrail and CGA to donn/doff pants and perform peri care w/total A. Pt requested pain meds, nursing notified. Pt was left supine in bed, all needs in reach, nursing present to administer pain meds. Missed 42 minutes of skilled PT due to pt refusal.   Therapy Documentation Precautions:  Precautions Precautions: Fall Precaution Comments: mild R hemi, posterior bias Restrictions Weight Bearing Restrictions: No   Therapy/Group: Individual Therapy Alec Sanchez, PT, DPT  12/11/2020, 7:56 AM

## 2020-12-11 NOTE — Progress Notes (Signed)
Patient reported to have 2 episodes of bloody stools with dark clots. Per reports stool is brown and did report some f abdominal pain after second episode.  He is on multiple laxatives due to issues with constipation and had multiple stools last night.  IV protonix 40 mg BID added. Stat CBC shows rise in H/H but likely due to hemoconcentration due to AKI. SQ Heparin d/c. Will repeat CBC in 6 hours to monitor for stability. Likely need to hold ASA/Plavix--will reach out to GI for input.

## 2020-12-11 NOTE — Progress Notes (Signed)
CH attempted visit per [facility] consult for assistance w/AD education/completion.  Pt. lying in bed visibly uncomfortable, complaining of not feeling well; pt. requests follow-up visit at another time.  Chaplains will attempt another visit later this week if possible.  Elpidio Anis, Chaplain Pager: [phone]

## 2020-12-11 NOTE — Progress Notes (Signed)
Occupational Therapy Session Note  Patient Details  Name: Alec Sanchez MRN: [id number] Date of Birth: 1955/06/24  Today's Date: 12/11/2020 OT Individual Time: 1001-1115 OT Individual Time Calculation (min): 74 min    Short Term Goals: Week 1:  OT Short Term Goal 1 (Week 1): Pt will perform BSC/toilet transfer CGA with LRAD OT Short Term Goal 2 (Week 1): Pt will perform LB dress with hemitechnique CGA OT Short Term Goal 3 (Week 1): Pt will perform bathing tasks at shower level CGA with AE PRN   Skilled Therapeutic Interventions/Progress Updates:    Pt semi reclined in bed, no c/o pain, appearing sleepy but pleasant.  Pt agreeable to washing up and getting into his own clothes.  Pt completed supine to sit using bed rail with increased time to complete and close supervision.  Step pivot EOB to w/c with CGA using RW.  Pt self propelled w/c to sink with min assist to maneuver tight turn.  Pt initially needing multimodal cues to initiate sinkside tasks then min Vcs to sequence through bathing and dressing.  Pt able to complete UB bathing and dressing with supervision.  LB bathing and dressing completed with min assist mainly for brief management and CGA for balance in standing.  Pt requesting to use toilet midway through self care.  Pt ambulated approximately 10 feet to bathroom using RW and completed toilet transfer with CGA needing intermittent cues to increase right foot clearance and stride length during gait.  Pt reported having difficulty having BM and feeling constipated.  Nurse made aware. Pt completed toilet transfer using grab bar and ambulated 10 feet back to sink using RW with CGA and Vcs to keep BLE within RW frame.  Pt finished bathing and dressing LB as previously mentioned. Sitting up in w/c with call bell in reach, seat alarm on at end of session.  Pt needed significantly increased time to complete basic self care due to slow processing and impaired sequencing and problem solving.     Therapy Documentation Precautions:  Precautions Precautions: Fall Precaution Comments: mild R hemi, posterior bias Restrictions Weight Bearing Restrictions: No    Therapy/Group: Individual Therapy  Amie Critchley 12/11/2020, 12:56 PM

## 2020-12-12 ENCOUNTER — Encounter (HOSPITAL_COMMUNITY): Payer: Self-pay | Admitting: Physical Medicine and Rehabilitation

## 2020-12-12 DIAGNOSIS — I10 Essential (primary) hypertension: Secondary | ICD-10-CM

## 2020-12-12 DIAGNOSIS — F1010 Alcohol abuse, uncomplicated: Secondary | ICD-10-CM

## 2020-12-12 DIAGNOSIS — K922 Gastrointestinal hemorrhage, unspecified: Secondary | ICD-10-CM

## 2020-12-12 DIAGNOSIS — N183 Chronic kidney disease, stage 3 unspecified: Secondary | ICD-10-CM

## 2020-12-12 LAB — BASIC METABOLIC PANEL
Anion gap: 12 (ref 5–15)
BUN: 37 mg/dL — ABNORMAL HIGH (ref 8–23)
CO2: 25 mmol/L (ref 22–32)
Calcium: 9.6 mg/dL (ref 8.9–10.3)
Chloride: 98 mmol/L (ref 98–111)
Creatinine, Ser: 1.67 mg/dL — ABNORMAL HIGH (ref 0.61–1.24)
GFR, Estimated: 45 mL/min — ABNORMAL LOW (ref >60)
Glucose, Bld: 116 mg/dL — ABNORMAL HIGH (ref 70–99)
Potassium: 3.7 mmol/L (ref 3.5–5.1)
Sodium: 135 mmol/L (ref 135–145)

## 2020-12-12 LAB — CBC
HCT: 48.3 % (ref 39.0–52.0)
HCT: 47.5 % (ref 39.0–52.0)
HCT: 48.5 % (ref 39.0–52.0)
Hemoglobin: 16.6 g/dL (ref 13.0–17.0)
Hemoglobin: 16.1 g/dL (ref 13.0–17.0)
Hemoglobin: 16.2 g/dL (ref 13.0–17.0)
MCH: 30.6 pg (ref 26.0–34.0)
MCH: 30.4 pg (ref 26.0–34.0)
MCH: 30.2 pg (ref 26.0–34.0)
MCHC: 34.4 g/dL (ref 30.0–36.0)
MCHC: 33.9 g/dL (ref 30.0–36.0)
MCHC: 33.4 g/dL (ref 30.0–36.0)
MCV: 89 fL (ref 80.0–100.0)
MCV: 89.6 fL (ref 80.0–100.0)
MCV: 90.5 fL (ref 80.0–100.0)
Platelets: 420 10*3/uL — ABNORMAL HIGH (ref 150–400)
Platelets: 434 10*3/uL — ABNORMAL HIGH (ref 150–400)
Platelets: 434 10*3/uL — ABNORMAL HIGH (ref 150–400)
RBC: 5.43 MIL/uL (ref 4.22–5.81)
RBC: 5.3 MIL/uL (ref 4.22–5.81)
RBC: 5.36 MIL/uL (ref 4.22–5.81)
RDW: 14 % (ref 11.5–15.5)
RDW: 13.9 % (ref 11.5–15.5)
RDW: 14 % (ref 11.5–15.5)
WBC: 15.4 10*3/uL — ABNORMAL HIGH (ref 4.0–10.5)
WBC: 13.6 10*3/uL — ABNORMAL HIGH (ref 4.0–10.5)
WBC: 13.7 10*3/uL — ABNORMAL HIGH (ref 4.0–10.5)
nRBC: 0 % (ref 0.0–0.2)
nRBC: 0 % (ref 0.0–0.2)
nRBC: 0 % (ref 0.0–0.2)

## 2020-12-12 LAB — ABO/RH: ABO/RH(D): A POS

## 2020-12-12 LAB — LACTIC ACID, PLASMA
Lactic Acid, Venous: 1.2 mmol/L (ref 0.5–1.9)
Lactic Acid, Venous: 1 mmol/L (ref 0.5–1.9)

## 2020-12-12 LAB — TYPE AND SCREEN
ABO/RH(D): A POS
Antibody Screen: NEGATIVE

## 2020-12-12 MED ORDER — PEG-KCL-NACL-NASULF-NA ASC-C 100 G PO SOLR
0.5000 | Freq: Once | ORAL | Status: AC
  Administered 2020-12-12: 100 g via ORAL
  Filled 2020-12-12: qty 1

## 2020-12-12 MED ORDER — POLYETHYLENE GLYCOL 3350 17 G PO PACK
17.0000 g | PACK | Freq: Two times a day (BID) | ORAL | Status: DC
  Administered 2020-12-14 – 2020-12-21 (×14): 17 g via ORAL
  Filled 2020-12-12 (×16): qty 1

## 2020-12-12 MED ORDER — PANTOPRAZOLE SODIUM 40 MG PO TBEC
40.0000 mg | DELAYED_RELEASE_TABLET | Freq: Every day | ORAL | Status: DC
  Administered 2020-12-13 – 2020-12-21 (×9): 40 mg via ORAL
  Filled 2020-12-12 (×10): qty 1

## 2020-12-12 MED ORDER — MORPHINE SULFATE (PF) 2 MG/ML IV SOLN
2.0000 mg | INTRAVENOUS | Status: DC | PRN
  Administered 2020-12-12: 2 mg via INTRAVENOUS
  Filled 2020-12-12: qty 1

## 2020-12-12 MED ORDER — PEG-KCL-NACL-NASULF-NA ASC-C 100 G PO SOLR: 1.0000 | Freq: Once | ORAL | Status: DC

## 2020-12-12 MED ORDER — POLYETHYLENE GLYCOL 3350 17 G PO PACK
17.0000 g | PACK | Freq: Two times a day (BID) | ORAL | Status: DC
  Administered 2020-12-12: 17 g via ORAL
  Filled 2020-12-12: qty 1

## 2020-12-12 MED ORDER — BISACODYL 5 MG PO TBEC
20.0000 mg | DELAYED_RELEASE_TABLET | Freq: Once | ORAL | Status: AC
  Administered 2020-12-12: 20 mg via ORAL
  Filled 2020-12-12: qty 4

## 2020-12-12 MED ORDER — PEG-KCL-NACL-NASULF-NA ASC-C 100 G PO SOLR: 0.5000 | Freq: Once | ORAL | Status: DC

## 2020-12-12 MED ORDER — SODIUM CHLORIDE 0.9 % IV BOLUS
250.0000 mL | Freq: Once | INTRAVENOUS | Status: AC
  Administered 2020-12-12: 250 mL via INTRAVENOUS

## 2020-12-12 MED ORDER — STERILE WATER FOR INJECTION IJ SOLN
INTRAMUSCULAR | Status: AC
  Filled 2020-12-12: qty 10

## 2020-12-12 MED ORDER — METOCLOPRAMIDE HCL 5 MG/ML IJ SOLN
10.0000 mg | Freq: Four times a day (QID) | INTRAMUSCULAR | Status: AC
  Administered 2020-12-12: 10 mg via INTRAVENOUS
  Filled 2020-12-12: qty 2

## 2020-12-12 NOTE — Consult Note (Addendum)
[facility] Consult: 10:15 AM 12/12/2020  LOS: 6 days    Referring Provider: Dr. Dalene Carrow of [facility] in[facility] rehab.   Primary Care Physician:  [facility], [facility] Primary Gastroenterologist:  unassigned    Reason for Consultation: Bloody stools, hematochezia.   HPI: Alec Sanchez is a 65 y.o. male.  Past history of hypertension.  Hyperlipidemia.  CVA, right-sided deficits.  03/2020 right carotid artery stenting.  Alcohol abuse, abstinent since 2001.  Renal artery stenosis.  Presumed IDA as she was on iron supplementation at admission.  Spinal cord stimulator in place. No prior EGD or colonoscopy.  10/7 -12/06/2020 inpatient admission with acute stroke, AKI, hypertensive urgency.  This in setting of DAPT with Plavix/aspirin.  Ultimately transferred to rehab unit  In the last few days suffering from constipation and was treated with laxatives.  Late last night developed hematochezia.  Reports of passing bloody stools with dark clots.   Was not hemodynamically unstable with the bleeding.  Patient denies previous outpatient issues with altered bowel habits, bleeding per rectum.  Was seen at urgent care about a week ago with dizziness and vague abdominal pain.  No longer experiencing abdominal pain.  Occasional nausea but no vomiting.  IV Protonix initiated, SQ heparin and Plavix discontinued.  Last dose of Plavix was the morning of 10/17. Systolic blood pressures maintaining in the 150s to 160s.  Diastolic pressures as high as 536 this morning, currently in the 80s.  No tachycardia.  Excellent sats on room air.  Hgb 14-17.3.  17.3 at 2340 last night Hgb is 16.6 at 0430 this morning.  Platelets in the 400s.  Recent INR 1.  Stable renal function with GFR in the mid 40s. Transaminases 48/66 >> 25/54.  Normal T bili and  alkaline phosphatase. Tox screen positive for THC.  EtOH level not assayed.  Family history negative for GI bleeding, colorectal cancers, peptic ulcer disease, bleeding disorders. Lives alone.  Stopped drinking alcohol in 2001.    Past Medical History:  Diagnosis Date   Anxiety    Carotid artery occlusion    Depression    Hyperlipidemia    Hypertension    Stroke [facility])     Past Surgical History:  Procedure Laterality Date   ANKLE SURGERY Left    TRANSCAROTID ARTERY REVASCULARIZATION  Right 04/12/2020   Procedure: RIGHT TRANSCAROTID ARTERY REVASCULARIZATION;  Surgeon: Maeola Harman, MD;  Location: [facility] OR;  Service: Vascular;  Laterality: Right;   ULTRASOUND GUIDANCE FOR VASCULAR ACCESS Left 04/12/2020   Procedure: ULTRASOUND GUIDANCE FOR VASCULAR ACCESS, left femoral vein;  Surgeon: Maeola Harman, MD;  Location: [facility] OR;  Service: Vascular;  Laterality: Left;    Prior to Admission medications   Medication Sig Start Date End Date Taking? Authorizing Provider  acetaminophen (TYLENOL) 325 MG tablet Take 2 tablets (650 mg total) by mouth every 6 (six) hours as needed for mild pain (or Fever >/= 101). 12/06/20   [name], MD  amLODipine (NORVASC) 10 MG tablet Take 10 mg by mouth every morning.    [provider]  aspirin EC 81 MG EC tablet Take 1 tablet (81 mg total) by mouth daily. Swallow whole. 12/06/20   [name], MD  atorvastatin (LIPITOR) 80 MG tablet Take 80 mg by mouth at bedtime.    [provider]  chlorthalidone (HYGROTON) 25 MG tablet Take 25 mg by mouth daily. For high blood pressure    [provider]  Cholecalciferol 25 MCG (1000 UT) tablet Take 1,000 Units by mouth every morning.    [provider]  clopidogrel (PLAVIX) 75 MG tablet Take 1 tablet (75 mg total) by mouth daily for 21 days. 12/06/20 12/27/20  [name], MD  ferrous sulfate 325 (65 FE) MG tablet Take 325 mg by mouth every Monday, Wednesday, and  Friday.    [provider]  lidocaine (LIDODERM) 5 % Place 1 patch onto the skin daily. Remove & Discard patch within 12 hours or as directed by MD    [provider]  methocarbamol (ROBAXIN) 500 MG tablet Take 1 tablet (500 mg total) by mouth every 8 (eight) hours as needed for muscle spasms. 12/06/20 01/05/21  [name], MD  nicotine (NICODERM CQ - DOSED IN MG/24 HOURS) 21 mg/24hr patch Place 21 mg onto the skin daily. Patient not taking: Reported on 12/01/2020    [provider]  nicotine polacrilex (NICORETTE) 2 MG gum Take 2 mg by mouth See admin instructions. Chew one piece (2 mg) in mouth as directed for smoking cessation: Weeks 1-6 - chew one piece of gum (2 mg) every 1-2 hours (max 24 pieces/day); to increase chances of quitting, chew at least 9 pieces/day during the first 6 weeks; weeks 7-9 - chew one piece of gum (2 mg) every 2-4 hours (max 24 pieces/day); weeks 10-12 - chew one piece of gum (2 mg) every 4-8 hours (max 24 pieces/day) Patient not taking: Reported on 12/01/2020    [provider]  oxyCODONE-acetaminophen (PERCOCET/ROXICET) 5-325 MG tablet Take 1-2 tablets by mouth every 4 (four) hours as needed for moderate pain. Patient not taking: No sig reported 04/13/20   Lars Mage, PA-C  PARoxetine (PAXIL) 40 MG tablet Take 20 mg by mouth every morning. For mental health    [provider]  prazosin (MINIPRESS) 1 MG capsule Take 1 capsule (1 mg total) by mouth at bedtime. Patient taking differently: Take 1 mg by mouth at bedtime. For enlarged prostate and for nightmares 11/30/20   Ivette Loyal, NP  sildenafil (VIAGRA) 100 MG tablet Take 100 mg by mouth daily as needed for erectile dysfunction (do not take within 6 hours of taking prazosin).    [provider]    Scheduled Meds:  amLODipine  10 mg Oral q morning   atorvastatin  80 mg Oral QHS   [START ON 12/15/2020] cloNIDine  0.2 mg Transdermal Weekly   hydrALAZINE  10 mg  Oral Q8H   lidocaine  1 patch Transdermal Q24H   pantoprazole (PROTONIX) IV  40 mg Intravenous Q12H   PARoxetine  20 mg Oral Daily   Infusions:  sodium chloride 75 mL/hr at 12/11/20 2301   PRN Meds: acetaminophen **OR** acetaminophen, methocarbamol, morphine injection, ondansetron   Allergies as of 12/06/2020 - Review Complete 12/06/2020  Allergen Reaction Noted   Trazodone Nausea And Vomiting 08/13/2019    Family History  Problem Relation Age of Onset   Heart failure Mother    Stroke Mother    Stroke Father    Hypertension Father    Diabetes Father  GER disease Cousin     Social History   Socioeconomic History   Marital status: Widowed    Spouse name: Not on file   Number of children: Not on file   Years of education: Not on file   Highest education level: Not on file  Occupational History   Not on file  Tobacco Use   Smoking status: Never   Smokeless tobacco: Never  Vaping Use   Vaping Use: Never used  Substance and Sexual Activity   Alcohol use: Not Currently   Drug use: Never   Sexual activity: Not on file  Other Topics Concern   Not on file  Social History Narrative   Not on file   Social Determinants of Health   Financial Resource Strain: Not on file  Food Insecurity: Not on file  Transportation Needs: Not on file  Physical Activity: Not on file  Stress: Not on file  Social Connections: Not on file  Intimate Partner Violence: Not on file    REVIEW OF SYSTEMS: Constitutional: No new weakness or fatigue ENT:  No nose bleeds Pulm: Denies shortness of breath or cough CV:  No palpitations, no LE edema.  No angina GU:  No hematuria, no frequency GI: See HPI.  Previously no GI issues with good appetite at home.  Denies dysphagia at any point. Heme: Other than the rectal bleeding, denies unusual or excessive bleeding or bruising Transfusions: None Neuro:  No headaches, no peripheral tingling or numbness.  No syncope, no dizziness. Derm:  No  itching, no rash or sores.  Endocrine:  No sweats or chills.  No polyuria or dysuria Immunization: Reviewed   PHYSICAL EXAM: Vital signs in last 24 hours: Vitals:   12/12/20 0913 12/12/20 0945  BP:  (!) 157/88  Pulse:  82  Resp: 19 17  Temp:    SpO2:     Wt Readings from Last 3 Encounters:  12/12/20 77.4 kg  12/06/20 84.1 kg  05/05/20 88.6 kg    General: Pleasant, somewhat aphasic gentleman.  Does not look acutely ill.  Resting comfortably in bed. Head: No facial asymmetry or swelling.  No signs of head trauma. Eyes: Conjunctiva pink. Ears: Not hard of hearing Nose: No discharge or congestion Mouth: Edentulous.  Mucosa is moist, pink, clear.  Tongue midline. Neck: No JVD, no masses, no TMG. Lungs: No labored breathing.  No cough.  Lungs CTA bilaterally. Heart: RRR.  No MRG.  S1, S2 present. Abdomen: Soft without distention.  Active bowel sounds.  No HSM, masses, bruits, hernias.   Rectal: Abundant soft medium to lighter brown stool on the depends and in the rectum.  There is a small amount of blood mixed in with this brown stool.  Prostate enlarged but no rectal masses. Musc/Skeltl: No joint redness, swelling or gross deformity. Extremities: No CCE. Neurologic: Oriented x3.  Fully alert.  Expressive aphasia is moderate.  Moves all 4 limbs without tremor.  Formal strength testing not performed. Skin: No suspicious bruising, purpura, lesions or rash. Nodes: No cervical adenopathy Psych: Calm, pleasant, cooperative.  Beach slow but precise/accurate  Intake/Output from previous day: 10/17 0701 - 10/18 0700 In: 120 [P.O.:120] Out: -  Intake/Output this shift: Total I/O In: 100 [P.O.:100] Out: -   LAB RESULTS: Recent Labs    12/11/20 2339 12/12/20 0435  WBC 14.0* 15.4*  HGB 17.3* 16.6  HCT 51.3 48.3  PLT 423* 420*   BMET Lab Results  Component Value Date   NA 135 12/12/2020  NA 134 (L) 12/11/2020   NA 136 12/07/2020   K 3.7 12/12/2020   K 3.6 12/11/2020    K 4.0 12/07/2020   CL 98 12/12/2020   CL 96 (L) 12/11/2020   CL 100 12/07/2020   CO2 25 12/12/2020   CO2 26 12/11/2020   CO2 26 12/07/2020   GLUCOSE 116 (H) 12/12/2020   GLUCOSE 123 (H) 12/11/2020   GLUCOSE 105 (H) 12/07/2020   BUN 37 (H) 12/12/2020   BUN 36 (H) 12/11/2020   BUN 23 12/07/2020   CREATININE 1.67 (H) 12/12/2020   CREATININE 1.79 (H) 12/11/2020   CREATININE 1.59 (H) 12/07/2020   CALCIUM 9.6 12/12/2020   CALCIUM 10.1 12/11/2020   CALCIUM 9.7 12/07/2020   LFT Recent Labs    12/11/20 0601  PROT 8.7*  ALBUMIN 4.3  AST 25  ALT 54*  ALKPHOS 76  BILITOT 0.8   PT/INR Lab Results  Component Value Date   INR 1.0 12/01/2020   INR 1.1 04/06/2020   INR 1.0 03/10/2020   Hepatitis Panel No results for input(s): HEPBSAG, HCVAB, HEPAIGM, HEPBIGM in the last 72 hours. C-Diff No components found for: CDIFF Lipase  No results found for: LIPASE  Drugs of Abuse     Component Value Date/Time   LABOPIA NONE DETECTED 12/01/2020 1401   COCAINSCRNUR NONE DETECTED 12/01/2020 1401   LABBENZ NONE DETECTED 12/01/2020 1401   AMPHETMU NONE DETECTED 12/01/2020 1401   THCU POSITIVE (A) 12/01/2020 1401   LABBARB NONE DETECTED 12/01/2020 1401     RADIOLOGY STUDIES: No results found.    IMPRESSION:   Bloody stool.  No prior colonoscopic evaluation.  In differential is stercoral ulcer from recent constipation, colonic mass/neoplasia, telangiectasia.  Recurrent stroke.  DAPT with Plavix/81 ASA discontinued last doses were the morning of 10/17, yesterday.    PLAN:     Since this is not an upper GI bleed, will discontinue Protonix but continue 40 mg p.o. once daily.  Maintain adequate bowel movements with daily MiraLAX (prior sorbitol, senna and MiraLAX were discontinued last night).  Given the lack of accompanying abdominal pain or tenderness, ischemic colitis less likely.  If okay to continue holding Plavix, will pursue colonoscopy later this week.  Clear liquids for  now but if Hb remains stable and bloody stools resolve or do not accelerate, could probably have solid food.    ?  Pursue CTAP.  However with his GFR that is been in the 40s, may not be able to receive IV contrast so study would be less sensitive for colitis.   Jennye Moccasin  12/12/2020, 10:15 AM Phone [phone]   Attending physician's note   I have taken an interval history, reviewed the chart and examined the patient. I agree with the Advanced Practitioner's note, impression and recommendations.   65yr old with HTN, HLD, CVA with aphasia s/p R carotid artery stenting 03/2020,, CKD3, H/O ETOH abuse (non since 2001, no cirrhosis) IDA with recent acute CVA 2 weeks ago on ASA/Plavix (last dose 10/17). GI consulted for rectal bleed and assoc constipation. Hb stable. HD stable.  He would need long-term antiplatelet medications. No UGI problems.  Plan: -Proceed with colon in AM (on ASA/plavix d/t recent CVA).  -Trend CBC  I have discussed risks and benefits. Pt understands that we may not be able to remove larger polyps d/t increased risk of bleeding).   Edman Circle, MD [facility] [phone]

## 2020-12-12 NOTE — Progress Notes (Signed)
Occupational Therapy Session Note  Patient Details  Name: Efe Fazzino MRN: [id number] Date of Birth: 1956-01-21  Today's Date: 12/12/2020 OT Missed Time: 60 Minutes Missed Time Reason: Patient ill (comment) (nausea)    Skilled Therapeutic Interventions/Progress Updates:    Pt semi reclined in bed with family present. Pt reports having diarrhea recently and still feeling nauseous.  Pt refusing all bed level and OOB OT measures offered and encouraged by this therapist.  Nurse notified of pts c/o nausea and request for medication.  Call bell in reach, bed alarm on.  Missed 60 minutes of therapy.  Therapy Documentation Precautions:  Precautions Precautions: Fall Precaution Comments: mild R hemi, posterior bias Restrictions Weight Bearing Restrictions: No   Therapy/Group: Individual Therapy  Amie Critchley 12/12/2020, 2:18 PM

## 2020-12-12 NOTE — Progress Notes (Signed)
Occupational Therapy Session Note  Patient Details  Name: Alec Sanchez MRN: [id number] Date of Birth: [birth year]  Today's Date: 12/12/2020 OT Individual Time: 3912-2583 OT Individual Time Calculation (min): 53 min    Short Term Goals: Week 1:  OT Short Term Goal 1 (Week 1): Pt will perform BSC/toilet transfer CGA with LRAD OT Short Term Goal 2 (Week 1): Pt will perform LB dress with hemitechnique CGA OT Short Term Goal 3 (Week 1): Pt will perform bathing tasks at shower level CGA with AE PRN  Skilled Therapeutic Interventions/Progress Updates:  Pt greeted supine in bed reporting pain in stomach feeling like he has a "hernia." Checked with nurse who reports pt okay to participate in session. Plan to shower during session with RN entering to cover IVs. Pt completed bed mobility with MINA reaching out for assist to elevate trunk into sitting. Pt complete sit<>stand with MIN A with Rw reporting dizziness needing to sit down. BP 122/97 ( 106) HR 93. Pt then reports need to void bowels. MINA for stand pivot transfer from EOB>BSC with RW. Pt required MAX A for 3/3 toileting tasks requesting that OTA complete posterior pericare for cleanliness, pt did assist with donning new brief needing overall MOD A. After toileting pt then reports "I'm going back to bed."   pt left supine in bed with bed alarm activated and all needs within reach.                  Therapy Documentation Precautions:  Precautions Precautions: Fall Precaution Comments: mild R hemi, posterior bias Restrictions Weight Bearing Restrictions: No  Pain: unrated pain in stomach from need to void bowels, increased rest breaks and repositioning provided.     Therapy/Group: Individual Therapy  Pollyann Glen [facility] 12/12/2020, 12:06 PM

## 2020-12-12 NOTE — Progress Notes (Signed)
Discussed MoviPrep with patient. Pt in agreement. No further questions Mylo Red, LPN

## 2020-12-12 NOTE — Progress Notes (Signed)
Pt non compliant with moviprep. Per Dr. Chestine Spore, Hold next dose, pt to be reevaluated in morning.

## 2020-12-12 NOTE — Progress Notes (Signed)
  Blood noted in patients stool. Stool is loose/diarrhea, appearance of clots present. On call provider Delle Reining PA, Notified. New orders received and implemented. Pt did complain of some abdominal pain from multiple bowel movements. Patient denies having any dizziness or headache. No distress noted, patient is calm, cooperative, and alert. No other needs expressed by pt at this time.

## 2020-12-12 NOTE — Consult Note (Addendum)
Medical Consultation   Alec Sanchez  WJX:[id number]  DOB: May 06, 1955  DOA: 12/06/2020  PCP: [facility]  Requesting physician: Delle Reining, PA-C  Reason for consultation: GIB   History of Present Illness: Alec Sanchez is an 65 y.o. male with h/o EtOH abuse, HTN, HLD, strokes.  Pt recently admitted to hospital for acute ischemic small vessel strokes.  Pt put on ASA + Plavix, has been admitted to CIR for rehab from strokes.  Pt reported to have 2 episodes of bloody stools.  Non-formed liquid stool with decent amount of blood per report as well as clots.  Does have some mild-mod abd pain after 2nd episode, though he tells me it is "not severe".  Pt started on IV protonix.  Initial HGB on CBC done this evening is 17.x.  Hospitalist asked to consult.   Review of Systems:  ROS As per HPI otherwise 10 point review of systems negative.     Past Medical History: Past Medical History:  Diagnosis Date   Anxiety    Carotid artery occlusion    Depression    Hyperlipidemia    Hypertension    Stroke [facility])     Past Surgical History: Past Surgical History:  Procedure Laterality Date   ANKLE SURGERY Left    TRANSCAROTID ARTERY REVASCULARIZATION  Right 04/12/2020   Procedure: RIGHT TRANSCAROTID ARTERY REVASCULARIZATION;  Surgeon: Maeola Harman, MD;  Location: [facility] OR;  Service: Vascular;  Laterality: Right;   ULTRASOUND GUIDANCE FOR VASCULAR ACCESS Left 04/12/2020   Procedure: ULTRASOUND GUIDANCE FOR VASCULAR ACCESS, left femoral vein;  Surgeon: Maeola Harman, MD;  Location: [facility] OR;  Service: Vascular;  Laterality: Left;     Allergies:   Allergies  Allergen Reactions   Trazodone Nausea And Vomiting     Social History:  reports that he has never smoked. He has never used smokeless tobacco. He reports that he does not currently use alcohol. He reports that he does not use drugs.   Family History: Family History  Problem  Relation Age of Onset   Heart failure Mother    Stroke Mother    Stroke Father    Hypertension Father    Diabetes Father    GER disease Cousin       Physical Exam: Vitals:   12/11/20 1941 12/11/20 2322 12/12/20 0001 12/12/20 0023  BP: (!) 175/99 (!) 169/113 (!) 179/90 (!) 159/98  Pulse: 89 94 90 91  Resp: 17 14 16    Temp: 98.4 F (36.9 C) 98.9 F (37.2 C) 98.4 F (36.9 C)   TempSrc: Oral Oral Oral   SpO2: 99% 100%  100%  Weight:      Height:        Constitutional: Alert and awake, oriented x3, uncomfortable Eyes: PERLA, EOMI, irises appear normal, anicteric sclera,  ENMT: external ears and nose appear normal            Lips appears normal, oropharynx mucosa, tongue, posterior pharynx appear normal  Neck: neck appears normal, no masses, normal ROM, no thyromegaly, no JVD  CVS: S1-S2 clear, no murmur rubs or gallops, no LE edema, normal pedal pulses  Respiratory:  clear to auscultation bilaterally, no wheezing, rales or rhonchi. Respiratory effort normal. No accessory muscle use.  Abdomen: Soft, mild TTP, no guarding nor rebound on my exam. Musculoskeletal: : no cyanosis, clubbing or edema noted bilaterally Neuro: Dysarthric speech, 4-/5 strength throughout. Psych: judgement and  insight appear normal, stable mood and affect, mental status Skin: no rashes or lesions or ulcers, no induration or nodules    Data reviewed:  I have personally reviewed following labs and imaging studies Labs:  CBC: Recent Labs  Lab 12/06/20 0145 12/07/20 0507 12/11/20 2339  WBC 10.0 8.2 14.0*  NEUTROABS  --  3.6 9.4*  HGB 16.0 16.3 17.3*  HCT 49.0 47.7 51.3  MCV 93.2 90.5 91.1  PLT 371 368 423*    Basic Metabolic Panel: Recent Labs  Lab 12/05/20 0338 12/06/20 0145 12/07/20 0507 12/11/20 0601  NA 136 134* 136 134*  K 3.2* 4.4 4.0 3.6  CL 102 104 100 96*  CO2 23 21* 26 26  GLUCOSE 122* 92 105* 123*  BUN 19 21 23  36*  CREATININE 1.70* 1.48* 1.59* 1.79*  CALCIUM 9.6 9.5 9.7  10.1   GFR Estimated Creatinine Clearance: 39.3 mL/min (A) (by C-G formula based on SCr of 1.79 mg/dL (H)). Liver Function Tests: Recent Labs  Lab 12/07/20 0507 12/11/20 0601  AST 48* 25  ALT 66* 54*  ALKPHOS 70 76  BILITOT 0.8 0.8  PROT 8.0 8.7*  ALBUMIN 4.0 4.3   No results for input(s): LIPASE, AMYLASE in the last 168 hours. No results for input(s): AMMONIA in the last 168 hours. Coagulation profile No results for input(s): INR, PROTIME in the last 168 hours.  Cardiac Enzymes: No results for input(s): CKTOTAL, CKMB, CKMBINDEX, TROPONINI in the last 168 hours. BNP: Invalid input(s): POCBNP CBG: Recent Labs  Lab 12/05/20 2140 12/06/20 0651  GLUCAP 107* 100*   D-Dimer No results for input(s): DDIMER in the last 72 hours. Hgb A1c No results for input(s): HGBA1C in the last 72 hours. Lipid Profile No results for input(s): CHOL, HDL, LDLCALC, TRIG, CHOLHDL, LDLDIRECT in the last 72 hours. Thyroid function studies No results for input(s): TSH, T4TOTAL, T3FREE, THYROIDAB in the last 72 hours.  Invalid input(s): FREET3 Anemia work up No results for input(s): VITAMINB12, FOLATE, FERRITIN, TIBC, IRON, RETICCTPCT in the last 72 hours. Urinalysis    Component Value Date/Time   COLORURINE YELLOW 12/01/2020 1401   APPEARANCEUR CLEAR 12/01/2020 1401   LABSPEC >1.046 (H) 12/01/2020 1401   PHURINE 5.0 12/01/2020 1401   GLUCOSEU NEGATIVE 12/01/2020 1401   HGBUR MODERATE (A) 12/01/2020 1401   BILIRUBINUR NEGATIVE 12/01/2020 1401   KETONESUR 80 (A) 12/01/2020 1401   PROTEINUR 30 (A) 12/01/2020 1401   UROBILINOGEN 0.2 11/30/2020 1304   NITRITE NEGATIVE 12/01/2020 1401   LEUKOCYTESUR NEGATIVE 12/01/2020 1401     Sepsis Labs Invalid input(s): PROCALCITONIN,  WBC,  LACTICIDVEN Microbiology No results found for this or any previous visit (from the past 240 hour(s)).     Inpatient Medications:   Scheduled Meds:  amLODipine  10 mg Oral q morning   atorvastatin  80 mg  Oral QHS   [START ON 12/15/2020] cloNIDine  0.2 mg Transdermal Weekly   hydrALAZINE  10 mg Oral Q8H   lidocaine  1 patch Transdermal Q24H   pantoprazole (PROTONIX) IV  40 mg Intravenous Q12H   PARoxetine  20 mg Oral Daily   Continuous Infusions:  sodium chloride 75 mL/hr at 12/11/20 2301     Radiological Exams on Admission: No results found.  Impression/Recommendations Principal Problem:   Ischemic cerebrovascular accident (CVA) of frontal lobe (HCC) Active Problems:   Alcohol abuse   CKD (chronic kidney disease)   Transaminitis   Essential hypertension   Labile blood pressure   Slow transit constipation  GIB (gastrointestinal bleeding)  New GIB with hematochezia- Hold ASA + Plavix Hold diuretics (chlorthalidone) IVF: NS at 75 Clear liquid diet Protonix 40 IV Q12H Initial HGB 17 Type and screen Repeat CBC in 6H No need for transfusion at this point, consider transfusion if HGB 7-10 range given recent ischemic strokes, but no transfusion for HGB above 10. Check lactic acid Had IMA occlusion on CTA earlier this month No evidence of bowel ischemia at that time And vasc surgery was less impressed Exam today: not nearly as much pain as I would expect with acute mesenteric ischemia Add Morphine PRN abd pain GI to see in AM (sounds like Yeison Sippel has already contacted them). H/o EtOH abuse - Out of window for withdrawal No liver abnormality seen on CT 10/7, no mention of cirrhosis on that CT or anywhere in his chart that I can see. Dont really have a reason therefore to suspect esophageal varices at this point. Constipation - Pt with Large BMs in setting of GIB, non formed Holding laxatives for the moment HTN - Holding chlorthalidone Will leave pt on norvasc, catapres, and hydralazine for the moment as his BPs (despite these meds, and despite GIB) continue to run high (159/98 most recently) Ischemic CVA - Holding ASA+Plavix as above in setting of acute  GIB Currently at CIR CKD 3 - Chronic, stable, baseline   Thank you for this consultation.  Our [facility] hospitalist team will follow the patient with you.   Time Spent: 80 min  Octave Montrose M. D.O. [facility] 12/12/2020, 1:14 AM

## 2020-12-12 NOTE — Care Plan (Signed)
This 64 years old male with history of EtOH abuse, hypertension, hyperlipidemia, recent stroke , He was discharged to inpatient rehab after having a stroke.  Patient had 2 episodes of bloody bowel movements associated with some mild abdominal pain this morning. Hemoglobin remained stable. Patient is hemodynamically stable.  GI is consulted. Aspirin and Plavix is on hold, continue Protonix 40 mg daily.  No need for transfusion at this time.  Lactic acid normal, Ischemic mesenteric artery occlusion is less likely. Plan for colonoscopy later this week.  Patient was seen and examined.

## 2020-12-12 NOTE — Progress Notes (Signed)
Physical Therapy Session Note  Patient Details  Name: Prathik Aman MRN: [id number] Date of Birth: [birth year]  Today's Date: 12/12/2020 PT Missed Time: 60 Minutes Missed Time Reason: Patient unwilling to participate;Pain  Pt presents resting in bed, awake. Pt refusing therapy, reports upset stomach and constipation. RN arriving for morning medications who confirms a GI bleed but still appropriate for therapy. Pt continued to refuse despite encouragement. Missed 60 minutes of skilled therapy.   Therapy/Group: Individual Therapy  [name] 12/12/2020, 7:38 AM

## 2020-12-12 NOTE — Progress Notes (Signed)
   12/12/20 1245  Clinical Encounter Type  Visited With Patient and family together  Visit Type Follow-up  Referral From Chaplain Elpidio Anis)  Consult/Referral To [employer] responded to the consult request for an Advanced Directive. The patient's daughter [name] and her friend, Bryna Colander, was at the patient's bedside. Chaplain provided Advance Directive education. The patient stated he wants to appoint Tiffany Plack to be his HCPOA. The chaplain advised her to inform the nurse when the document was ready to notarize. The chaplain also provides spiritual support by actively listening to the daughter and patient speak on their faith and wanting a better relationship with God. This note was prepared by Deneen Harts, M.Div..  For questions please contact by phone [phone].

## 2020-12-12 NOTE — Progress Notes (Signed)
PROGRESS NOTE   Subjective/Complaints: No more bloody stools this morning Discussed with family GI bleed.  Son-in-law requests referral for outpatient colonoscopy WBC trending downward  ROS: Denies CP, SOB, +nausea reported as per nursing notes  Objective:   No results found. Recent Labs    12/12/20 0435 12/12/20 1202  WBC 15.4* 13.6*  HGB 16.6 16.1  HCT 48.3 47.5  PLT 420* 434*    Recent Labs    12/11/20 0601 12/12/20 0435  NA 134* 135  K 3.6 3.7  CL 96* 98  CO2 26 25  GLUCOSE 123* 116*  BUN 36* 37*  CREATININE 1.79* 1.67*  CALCIUM 10.1 9.6    Intake/Output Summary (Last 24 hours) at 12/12/2020 1339 Last data filed at 12/12/2020 0918 Gross per 24 hour  Intake 100 ml  Output --  Net 100 ml        Physical Exam: Vital Signs Blood pressure (!) 157/88, pulse 82, temperature 98.6 F (37 C), temperature source Oral, resp. rate 17, height 5\' 8"  (1.727 m), weight 77.4 kg, SpO2 100 %. Gen: no distress, normal appearing HEENT: oral mucosa pink and moist, NCAT Cardio: Reg rate Chest: normal effort, normal rate of breathing Abd: soft, non-distended Ext: no edema Psych: pleasant, normal affect Neuro: Alert Makes eye contact with examiner.   Speech is dysarthric but intelligible, stable.   Follows simple commands. Motor: 4-/5 grossly throughout, unchanged  Assessment/Plan: 1. Functional deficits which require 3+ hours per day of interdisciplinary therapy in a comprehensive inpatient rehab setting. Physiatrist is providing close team supervision and 24 hour management of active medical problems listed below. Physiatrist and rehab team continue to assess barriers to discharge/monitor patient progress toward functional and medical goals  Care Tool:  Bathing    Body parts bathed by patient: Right arm, Left arm, Chest, Abdomen, Front perineal area, Buttocks, Right upper leg, Left upper leg, Face   Body  parts bathed by helper: Buttocks, Right lower leg, Left lower leg     Bathing assist Assist Level: Moderate Assistance - Patient 50 - 74%     Upper Body Dressing/Undressing Upper body dressing   What is the patient wearing?: Pull over shirt    Upper body assist Assist Level: Set up assist    Lower Body Dressing/Undressing Lower body dressing      What is the patient wearing?: Incontinence brief     Lower body assist Assist for lower body dressing: Moderate Assistance - Patient 50 - 74%     Toileting Toileting    Toileting assist Assist for toileting: Maximal Assistance - Patient 25 - 49%     Transfers Chair/bed transfer  Transfers assist     Chair/bed transfer assist level: Minimal Assistance - Patient > 75%     Locomotion Ambulation   Ambulation assist      Assist level: Contact Guard/Touching assist Assistive device: Walker-rolling Max distance: 60   Walk 10 feet activity   Assist     Assist level: Contact Guard/Touching assist Assistive device: Walker-rolling   Walk 50 feet activity   Assist Walk 50 feet with 2 turns activity did not occur: Safety/medical concerns  Assist level: Contact Guard/Touching assist Assistive  device: Walker-rolling    Walk 150 feet activity   Assist Walk 150 feet activity did not occur: Safety/medical concerns         Walk 10 feet on uneven surface  activity   Assist     Assist level: Minimal Assistance - Patient > 75% Assistive device: Walker-rolling   Wheelchair     Assist Is the patient using a wheelchair?: Yes Type of Wheelchair: Manual    Wheelchair assist level: Maximal Assistance - Patient 25 - 49% Max wheelchair distance: 40    Wheelchair 50 feet with 2 turns activity    Assist        Assist Level: Total Assistance - Patient < 25%   Wheelchair 150 feet activity     Assist      Assist Level: Total Assistance - Patient < 25%   Blood pressure (!) 157/88, pulse  82, temperature 98.6 F (37 C), temperature source Oral, resp. rate 17, height 5\' 8"  (1.727 m), weight 77.4 kg, SpO2 100 %.    Medical Problem List and Plan: 1.  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  Impaired mobility: -DVT/anticoagulation:  Pharmaceutical: Continue Heparin             -antiplatelet therapy: Aspirin and plavix held due to GI bleed 3. Low back pain: Lidoderm patch, Robaxin as needed muscle spasms. Kpad added.              Monitor with increased exertion 4. Depression: Continue Paxil 20 mg daily             -antipsychotic agents: N/A 5. Neuropsych: This patient is capable of making decisions on his own behalf. 6. Skin/Wound Care: Routine skin checks 7. Fluids/Electrolytes/Nutrition: Routine in and outs             CMP ordered for tomorrow a.m. 8.  Hypertension.  Norvasc 10 mg daily, Hygroton 25 mg daily.  Added clonidine patch for HTN/low back pain  Hydralazine 10 3 times daily started on 10/16  Elevated on 10/17. Clonidine increased to 0.2mg . Continue to monitor for effects of yesterday's change             Monitor with increased mobility 9.  Hyperlipidemia: Continue Lipitor 10.  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  Follow-up outpatient 11.  History of tobacco use as well as marijuana.  Urine drug screen positive marijuana.  Counseling 12.  History of hematuria/renal colic.  Continue to monitor 13.  Medical noncompliance.  Counseling 14. AKI:  Improving, give another 250 cc bolus today and repeat creatinine tomorrow.  6- 8 glasses of water per day- placed nursing order 15. Overweight: BMI 26.65: provide dietary  education 16.  Transaminitis  Downtrending, discussed with patient.  17.  Slow transit constipation  Bowel meds increased on 10/15, increased again on 10/16 18. GI bleed: started on daily protonix. Aspirin and Plavix held  LOS: 6 days A FACE TO FACE EVALUATION WAS PERFORMED  11/16 P Kalan Rinn 12/12/2020, 1:39 PM

## 2020-12-13 ENCOUNTER — Encounter (HOSPITAL_COMMUNITY)
Admission: RE | Disposition: A | Discharge status: Home Health | Payer: Self-pay | Source: Intra-hospital | Attending: Physical Medicine and Rehabilitation

## 2020-12-13 LAB — BASIC METABOLIC PANEL
Anion gap: 10 (ref 5–15)
BUN: 27 mg/dL — ABNORMAL HIGH (ref 8–23)
CO2: 26 mmol/L (ref 22–32)
Calcium: 9.3 mg/dL (ref 8.9–10.3)
Chloride: 98 mmol/L (ref 98–111)
Creatinine, Ser: 1.58 mg/dL — ABNORMAL HIGH (ref 0.61–1.24)
GFR, Estimated: 48 mL/min — ABNORMAL LOW (ref >60)
Glucose, Bld: 90 mg/dL (ref 70–99)
Potassium: 3.7 mmol/L (ref 3.5–5.1)
Sodium: 134 mmol/L — ABNORMAL LOW (ref 135–145)

## 2020-12-13 LAB — CBC
HCT: 45.8 % (ref 39.0–52.0)
Hemoglobin: 15.3 g/dL (ref 13.0–17.0)
MCH: 30.4 pg (ref 26.0–34.0)
MCHC: 33.4 g/dL (ref 30.0–36.0)
MCV: 90.9 fL (ref 80.0–100.0)
Platelets: 404 10*3/uL — ABNORMAL HIGH (ref 150–400)
RBC: 5.04 MIL/uL (ref 4.22–5.81)
RDW: 14 % (ref 11.5–15.5)
WBC: 12.7 10*3/uL — ABNORMAL HIGH (ref 4.0–10.5)
nRBC: 0 % (ref 0.0–0.2)

## 2020-12-13 LAB — PHOSPHORUS: Phosphorus: 3.2 mg/dL (ref 2.5–4.6)

## 2020-12-13 LAB — MAGNESIUM: Magnesium: 2.1 mg/dL (ref 1.7–2.4)

## 2020-12-13 SURGERY — COLONOSCOPY WITH PROPOFOL
Anesthesia: Monitor Anesthesia Care

## 2020-12-13 MED ORDER — CLONIDINE HCL 0.3 MG/24HR TD PTWK: 0.3000 mg | MEDICATED_PATCH | TRANSDERMAL | Status: DC

## 2020-12-13 MED ORDER — CLONIDINE HCL 0.1 MG/24HR TD PTWK
0.1000 mg | MEDICATED_PATCH | TRANSDERMAL | Status: DC
  Administered 2020-12-15: 0.1 mg via TRANSDERMAL
  Filled 2020-12-13: qty 1

## 2020-12-13 MED ORDER — NYSTATIN 100000 UNIT/ML MT SUSP
5.0000 mL | Freq: Four times a day (QID) | OROMUCOSAL | Status: DC
  Administered 2020-12-13 – 2020-12-21 (×30): 500000 [IU] via ORAL
  Filled 2020-12-13 (×31): qty 5

## 2020-12-13 MED ORDER — CLOPIDOGREL BISULFATE 75 MG PO TABS
75.0000 mg | ORAL_TABLET | Freq: Every day | ORAL | Status: DC
  Administered 2020-12-14 – 2020-12-21 (×8): 75 mg via ORAL
  Filled 2020-12-13 (×8): qty 1

## 2020-12-13 NOTE — Progress Notes (Signed)
TR[facility] Note Follow up  Patient seen and examined.  He denies abdominal pain, he had a bowel movement, and denies bloody stool.  He could not tolerate prep for colonoscopy.  Plan for colonoscopy as an outpatient. Genera; patient alert.  Lungs; CTA Abdomen; soft nt.  No future bleeding.  Hemoglobin relatively stable.  GI gave the okay to resume Plavix. Patient medical stable, [facility]  will sign off.  Aletha Allebach.

## 2020-12-13 NOTE — Progress Notes (Addendum)
PROGRESS NOTE   Subjective/Complaints: No more bloody stools Ambulated well with Christian today WBC trending down Delayed processing  ROS: Denies CP, SOB, +nausea reported as per nursing notes  Objective:   No results found. Recent Labs    12/12/20 1850 12/13/20 0541  WBC 13.7* 12.7*  HGB 16.2 15.3  HCT 48.5 45.8  PLT 434* 404*    Recent Labs    12/12/20 0435 12/13/20 0541  NA 135 134*  K 3.7 3.7  CL 98 98  CO2 25 26  GLUCOSE 116* 90  BUN 37* 27*  CREATININE 1.67* 1.58*  CALCIUM 9.6 9.3    Intake/Output Summary (Last 24 hours) at 12/13/2020 1103 Last data filed at 12/12/2020 1800 Gross per 24 hour  Intake 1389.9 ml  Output 200 ml  Net 1189.9 ml        Physical Exam: Vital Signs Blood pressure (!) 149/91, pulse 82, temperature 98.6 F (37 C), temperature source Oral, resp. rate 16, height 5\' 8"  (1.727 m), weight 76.5 kg, SpO2 100 %. Gen: no distress, normal appearing HEENT: oral mucosa pink and moist, NCAT Cardio: Reg rate Chest: normal effort, normal rate of breathing Abd: soft, non-distended Ext: no edema Psych: pleasant, normal affect Skin: intact Neuro: Alert Makes eye contact with examiner.   Speech is dysarthric but intelligible, stable.   Follows simple commands. Motor: 4-/5 grossly throughout, unchanged  Assessment/Plan: 1. Functional deficits which require 3+ hours per day of interdisciplinary therapy in a comprehensive inpatient rehab setting. Physiatrist is providing close team supervision and 24 hour management of active medical problems listed below. Physiatrist and rehab team continue to assess barriers to discharge/monitor patient progress toward functional and medical goals  Care Tool:  Bathing    Body parts bathed by patient: Right arm, Left arm, Chest, Abdomen, Front perineal area, Buttocks, Right upper leg, Left upper leg, Face   Body parts bathed by helper:  Buttocks, Right lower leg, Left lower leg     Bathing assist Assist Level: Moderate Assistance - Patient 50 - 74%     Upper Body Dressing/Undressing Upper body dressing   What is the patient wearing?: Pull over shirt    Upper body assist Assist Level: Set up assist    Lower Body Dressing/Undressing Lower body dressing      What is the patient wearing?: Incontinence brief     Lower body assist Assist for lower body dressing: Moderate Assistance - Patient 50 - 74%     Toileting Toileting    Toileting assist Assist for toileting: Maximal Assistance - Patient 25 - 49%     Transfers Chair/bed transfer  Transfers assist     Chair/bed transfer assist level: Minimal Assistance - Patient > 75%     Locomotion Ambulation   Ambulation assist      Assist level: Contact Guard/Touching assist Assistive device: Walker-rolling Max distance: 60   Walk 10 feet activity   Assist     Assist level: Contact Guard/Touching assist Assistive device: Walker-rolling   Walk 50 feet activity   Assist Walk 50 feet with 2 turns activity did not occur: Safety/medical concerns  Assist level: Contact Guard/Touching assist Assistive device: Walker-rolling  Walk 150 feet activity   Assist Walk 150 feet activity did not occur: Safety/medical concerns         Walk 10 feet on uneven surface  activity   Assist     Assist level: Minimal Assistance - Patient > 75% Assistive device: Walker-rolling   Wheelchair     Assist Is the patient using a wheelchair?: Yes Type of Wheelchair: Manual    Wheelchair assist level: Maximal Assistance - Patient 25 - 49% Max wheelchair distance: 40    Wheelchair 50 feet with 2 turns activity    Assist        Assist Level: Total Assistance - Patient < 25%   Wheelchair 150 feet activity     Assist      Assist Level: Total Assistance - Patient < 25%   Blood pressure (!) 149/91, pulse 82, temperature 98.6 F  (37 C), temperature source Oral, resp. rate 16, height 5\' 8"  (1.727 m), weight 76.5 kg, SpO2 100 %.    Medical Problem List and Plan: 1.  Right facial droop with dysarthria and altered mental status secondary to 3 small acute ischemic infarcts involving the right anterior genu of the corpus callosum, periventricular white matter of the right posterior corona radiata, and right lentiform nucleus likely due to small vessel disease as well as history of CVA 2021 with residual right-sided weakness  Continue CIR  -Interdisciplinary Team Conference today   2.  Impaired mobility: -DVT/anticoagulation:  Pharmaceutical: Continue Heparin             -antiplatelet therapy: Aspirin and plavix held due to GI bleed 3. Low back pain: Lidoderm patch, Robaxin as needed muscle spasms. Kpad added.              Monitor with increased exertion 4. Depression: Continue Paxil 20 mg daily             -antipsychotic agents: N/A 5. Neuropsych: This patient is capable of making decisions on his own behalf. 6. Skin/Wound Care: Routine skin checks 7. Fluids/Electrolytes/Nutrition: Routine in and outs             CMP ordered for tomorrow a.m. 8.  Hypertension.  Norvasc 10 mg daily, Hygroton 25 mg daily.  Added clonidine patch for HTN/low back pain  Hydralazine 10 3 times daily started on 10/16  Elevated on 10/17. Maintain clonidine 0.1mg  for 1 week and adjust at that point if needed. Continue to monitor for effects of yesterday's change             Monitor with increased mobility 9.  Hyperlipidemia: Continue Lipitor 10.  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  Follow-up outpatient 11.  History of tobacco use as well as marijuana.  Urine drug screen positive marijuana.  Counseling 12.  History of hematuria/renal colic.  Continue to monitor 13.  Medical noncompliance.  Counseling 14. AKI:  Cr improved to 1.58, repeat monday 6- 8 glasses of water per day- placed nursing order 15. Overweight: BMI 26.65:  provide dietary education 16.  Transaminitis  Downtrending, discussed with patient.  17.  Slow transit constipation  Bowel meds increased on 10/15, increased again on 10/16 18. GI bleed: started on daily protonix. Aspirin and Plavix held. Discussed with GI- most likely due to constipation. Resolved.   LOS: 7 days A FACE TO FACE EVALUATION WAS PERFORMED  Tylasia Fletchall P Kennidi Yoshida 12/13/2020, 11:03 AM

## 2020-12-13 NOTE — Patient Care Conference (Signed)
Inpatient RehabilitationTeam Conference and Plan of Care Update Date: 12/13/2020   Time: 10:58 AM   Patient Name: Alec Sanchez      Medical Record Number: [id number]  Date of Birth: 1955-09-30 Sex: Male         Room/Bed: 5C10C/5C10C-01 Payor Info: Payor: [employer] / Plan: [facility] / Product Type: *No Product type* /    Admit Date/Time:  12/06/2020  3:50 PM  Primary Diagnosis:  Ischemic cerebrovascular accident (CVA) of frontal lobe [facility])  Hospital Problems: Principal Problem:   Ischemic cerebrovascular accident (CVA) of frontal lobe (HCC) Active Problems:   Alcohol abuse   CKD (chronic kidney disease)   Transaminitis   Essential hypertension   Labile blood pressure   Slow transit constipation   GIB (gastrointestinal bleeding)    Expected Discharge Date: Expected Discharge Date: 12/21/20  Team Members Present: Physician leading conference: Dr. Sula Soda Nurse Present: Other (comment) Konrad Dolores RN) PT Present: Wynelle Link, PT OT Present: Dolphus Jenny, OT PPS Coordinator present : Fae Pippin, SLP     Current Status/Progress Goal Weekly Team Focus  Bowel/Bladder   Continent with some incontinent episodes of bowel. LBM 10/18  Full continence  Toilet q2-3hr, and PRN   Swallow/Nutrition/ Hydration             ADL's   CGA-supervision with increased time for processing and sequencing  mod I  self care training, functional transfers, activity tolerance   Mobility   Multiple refusals - may need to downgrade frequency. minA bed mobility, CGA transfers, short distance (53ft) gait with CGA and RW  mod I  global strengthening and endurance training, gait training, safety awareness, standing balance, improving participation with therapies   Communication             Safety/Cognition/ Behavioral Observations            Pain   Some abdominal pain.  <3/10  Assess Qshift and PRN   Skin   Skin intact  Remain intact.  Assess  Qshift and PRN     Discharge Planning:  Home with intermittent assist from daughter, goes to her home for meals, aware will not be driving at discharge, but may not listen to this recommendation   Team Discussion: GI bleed; bloody stools endo consulted; unable to tolerate prep endoscopy postponed. Slow progression due to medical issues Patient on target to meet rehab goals: yes  *See Care Plan and progress notes for long and short-term goals.   Revisions to Treatment Plan:  Advance to full liquid diet and as tolerated; Teaching Needs: Safety; medications;secondary risk management; transfers ; toileting; etc  Current Barriers to Discharge: Decreased caregiver support and Home enviroment access/layout  Possible Resolutions to Barriers: Family education; home health follow up      Medical Summary Current Status: GI bleed, low back pain, hypertension, constipation, AKI  Barriers to Discharge: Medical stability  Barriers to Discharge Comments: GI bleed, low back pain, hypertension, constipation, AKI Possible Resolutions to [employer] Focus: GI consulted, transitioned to po protonix, aspirin and plavix held, clonidine started for hypertnesion, bowel regumen started for constipation, fluid boluses administered for AKI   Continued Need for Acute Rehabilitation Level of Care: The patient requires daily medical management by a physician with specialized training in physical medicine and rehabilitation for the following reasons: Direction of a multidisciplinary physical rehabilitation program to maximize functional independence : Yes Medical management of patient stability for increased activity during participation in an intensive rehabilitation regime.: Yes  Analysis of laboratory values and/or radiology reports with any subsequent need for medication adjustment and/or medical intervention. : Yes   I attest that I was present, lead the team conference, and concur with the assessment  and plan of the team.   Chana Bode B 12/13/2020, 12:10 PM

## 2020-12-13 NOTE — Progress Notes (Signed)
Physical Therapy Session Note  Patient Details  Name: Alec Sanchez MRN: [id number] Date of Birth: [birth year]  Today's Date: 12/13/2020 PT Individual Time: 0867-6195 + 0932-6712 PT Individual Time Calculation (min): 54 min + 54 min  Short Term Goals: Week 1:  PT Short Term Goal 1 (Week 1): Pt will transfer sup to sit w/ min A PT Short Term Goal 2 (Week 1): Pt will transfer sit to stand w/ CGA w/o verbal cues for sequencing. PT Short Term Goal 3 (Week 1): Pt will amb w/ RW and CGA up to 75'  Skilled Therapeutic Interventions/Progress Updates:     1st session: Pt supine in bed on arrival, sleeping and awakens easily to voice. NT at bedside for morning vitals, BP reading 181/100 - NT relayed to RN. Pt agreeable to PT tx with convincing - supine<>sit completed at supervision level, slow movement and slow initiation. Sit's EOB with CGA and donned scrub pants with modA for threading. Sit<>stand to RW with CGA and able to pull pants over hips in standing with CGA for balance - noted posterior bias in standing required cues for correcting. Stand<>pivot transfer completed with CGA/minA and RW - cues needed for safety awareness and reducing posterior lean with weight on heels. Pt transported downstairs to 38M rehab gym for time in his w/c. Completed functional gait training - ambulated 2x136ft with CGA (progressing to supervision) and RW - gait speed is significantly reduced, 0.35m/s, indicative of increased falls risk. No LOB or knee buckling observed but delayed processing and difficulty changing gait speed noted. Rest break needed b/w bouts. Pt returned upstairs to his room and completed ambulatory transfer with RW and CGA within his room. Able to complete bed mobility with supervision with HOB flat and use of bed rail. HOB raised, bed alarm on, all needs in reach at conclusion of session. Informed of upcoming therapy schedule and encouraged him to try and complete his scheduled therapies today due to recent  refusals - pt voiced understanding.   2nd session: Pt seated in recliner to start, agreeable to PT tx. Reports mild nausea, does not indicate any pain. Treatment to tolerance. Sit<>Stand to RW with supervision from recliner height. He ambulates from his room to rehab gym on [facility], ~166ft with supervision and RW. Gait speed <0.2 m/s and demonstrates very short step lengths bilaterally. Cues throughout for gait corrections and lengthening stride length. Pt assisted onto the Nustep - completed 10 minutes at workload of 4, focusing on lengthening strides to carryover into functional gait. Pt with very slowed cadence on Nustep - averaging around 10-15 steps/minute, despite verbal instruction for higher cadence. Next, worked on UE strengthening and engagement with participation in therapy. Played pt selected rock-n-roll music during exercises. With 3lb dowel rod, completed 2x5 shldr press + 2x5 chest press. Also completed 2x5 sit<>stand transfers while grasping the 3lb dowel rod in BUE's - focusing on producing power in LE's. Required minA for powering to rise for each stand. Rest breaks provided b/w all sets due to fatigue. Pt then ambulated back to his room, ~143ft, with RW and supervision - continued to require cues for lengthening stride/step length but no formal LOB or knee buckling observed. Pt assisted back to bed with bed mobility completed at supervision level. Bed alarm on, all needs in reach.   Therapy Documentation Precautions:  Precautions Precautions: Fall Precaution Comments: mild R hemi, posterior bias Restrictions Weight Bearing Restrictions: No General:    Therapy/Group: Individual Therapy  [name] 12/13/2020, 7:41 AM

## 2020-12-13 NOTE — Progress Notes (Signed)
     Progress Note    ASSESSMENT AND PLAN:   Rectal bleeding-likely due to stercoral ulceration.  Refuses colonoscopy/FS Associated constipation CVA  Plan: -Can resume Plavix -Trend CBC -Continue MiraLAX 17 g p.o. once a day to avoid constipation. -Will sign off for now. -He can certainly follow-up as an outpatient in the GI clinic if he decides to get colon performed in future.     SUBJECTIVE   No further bleeding. Refuses to drink preparation. Refuses colonoscopy or flexible sigmoidoscopy. Having bowel movements No abdominal pain  Hemoglobin has been stable.  OBJECTIVE:     Vital signs in last 24 hours: Temp:  [97.4 F (36.3 C)-99.1 F (37.3 C)] 98.6 F (37 C) (10/19 0808) Pulse Rate:  [81-96] 82 (10/19 0901) Resp:  [16-20] 16 (10/19 0808) BP: (141-181)/(85-109) 149/91 (10/19 0901) SpO2:  [98 %-100 %] 100 % (10/19 0808) Weight:  [76.5 kg] 76.5 kg (10/19 0440) Last BM Date: 12/12/20 General:   Alert, well-developed male in NAD Abdomen:  Soft, nondistended, nontender.  Normal bowel sounds,.       Neurologic:  Alert and  oriented x4;  grossly normal neurologically. Psych:  Pleasant, cooperative.  Normal mood and affect.   Intake/Output from previous day: 10/18 0701 - 10/19 0700 In: 1489.9 [P.O.:360; I.V.:1125.7; IV Piggyback:4.2] Out: 200 [Urine:200] Intake/Output this shift: No intake/output data recorded.  Lab Results: Recent Labs    12/12/20 1202 12/12/20 1850 12/13/20 0541  WBC 13.6* 13.7* 12.7*  HGB 16.1 16.2 15.3  HCT 47.5 48.5 45.8  PLT 434* 434* 404*   BMET Recent Labs    12/11/20 0601 12/12/20 0435 12/13/20 0541  NA 134* 135 134*  K 3.6 3.7 3.7  CL 96* 98 98  CO2 26 25 26   GLUCOSE 123* 116* 90  BUN 36* 37* 27*  CREATININE 1.79* 1.67* 1.58*  CALCIUM 10.1 9.6 9.3   LFT Recent Labs    12/11/20 0601  PROT 8.7*  ALBUMIN 4.3  AST 25  ALT 54*  ALKPHOS 76  BILITOT 0.8   PT/INR No results for input(s): LABPROT, INR in  the last 72 hours. Hepatitis Panel No results for input(s): HEPBSAG, HCVAB, HEPAIGM, HEPBIGM in the last 72 hours.  No results found.   Principal Problem:   Ischemic cerebrovascular accident (CVA) of frontal lobe (HCC) Active Problems:   Alcohol abuse   CKD (chronic kidney disease)   Transaminitis   Essential hypertension   Labile blood pressure   Slow transit constipation   GIB (gastrointestinal bleeding)     LOS: 7 days     12/13/20, MD 12/13/2020, 11:10 AM 12/15/2020 GI [phone]

## 2020-12-13 NOTE — Progress Notes (Signed)
Patient ID: Alec Sanchez, male   DOB: September 21, 1955, 65 y.o.   MRN: [id number]  Met with pt to inform him of team conference goals of mod/I-supervision and no driving. He understands this and plans to have daughter or son in-law bring the meals to him. He feels better and able to participate more in therapies. Gave him his daughter's-Alec Sanchez FMLA forms to give to her. Discussed home health and rolling walker along with bedside commode. Will reach out to [name] to obtain this equipment and set up home health services. Will continue to work on discharge needs for target date of 10/27. Have left message for [name] also.

## 2020-12-13 NOTE — Progress Notes (Signed)
Occupational Therapy Session Note  Patient Details  Name: Alec Sanchez MRN: [id number] Date of Birth: [birth year]  Today's Date: 12/13/2020 OT Individual Time: 1443-1540 OT Individual Time Calculation (min): 65 min    Short Term Goals: Week 1:  OT Short Term Goal 1 (Week 1): Pt will perform BSC/toilet transfer CGA with LRAD OT Short Term Goal 2 (Week 1): Pt will perform LB dress with hemitechnique CGA OT Short Term Goal 3 (Week 1): Pt will perform bathing tasks at shower level CGA with AE PRN  Skilled Therapeutic Interventions/Progress Updates:    Pt semi reclined in bed, smiling and reporting he feels better today.  Agreeable to OT session, he reports he usually washes at the sink at home unless he plans to go out of the house for a specific reason.  Supine to sit with mod I.  Sit to stand and ambulation to sink using RW of about 5 feet with supervision.  Pt encouraged to increase active initiation and sequencing with less cues prior to beginning sinkside self care.  Pt bathed UB and LB with supervision needing min Vcs for problem solving.  Pt donned shirt, brief, and shorts with supervision.  Pt requesting to use toilet.  Supervision and cues for problem solving to sit>stand from sink and ambulate using RW to bathroom approx 10 feet. Toilet transfer using grab bar with increased time and supervision.  Pt had continent episode of bowel and bladder (documented in flowsheet).  Pericare and Acupuncturist with supervision.  Pt ambulated to recliner using RW with CGA to maneuver obstacles. Seated in recliner, call bell and seat alarm on at end of session.  Pt showed improvement today with less cueing needed to sequence and inititate during self care.  Still requiring extended time for processing.  Therapy Documentation Precautions:  Precautions Precautions: Fall Precaution Comments: mild R hemi, posterior bias Restrictions Weight Bearing Restrictions: No   Therapy/Group: Individual  Therapy  Amie Critchley 12/13/2020, 12:44 PM

## 2020-12-13 NOTE — Progress Notes (Signed)
Occupational Therapy Session Note  Patient Details  Name: Alec Sanchez MRN: [id number] Date of Birth: [birth year]  Today's Date: 12/13/2020 OT Individual Time: 1001-1028 OT Individual Time Calculation (min): 27 min    Short Term Goals: Week 1:  OT Short Term Goal 1 (Week 1): Pt will perform BSC/toilet transfer CGA with LRAD OT Short Term Goal 2 (Week 1): Pt will perform LB dress with hemitechnique CGA OT Short Term Goal 3 (Week 1): Pt will perform bathing tasks at shower level CGA with AE PRN  Skilled Therapeutic Interventions/Progress Updates:   Pt in bed to start agreeable to sitting EOB to work on reaching activity with the UEs.  Supervision for supine to sit with therapist placing checkers and Connect Four board in front of him for use.  He was able to maintain sitting balance at independent level while reaching to pick up and place checkers in the grid.  Independent to complete task with the LUE with supervision with the right.  Pt unable to in-hand manipulate checkers with the right hand, but was able to pick up one at at time from therapist hand with the RUE.  Completed several repetitions before transferring back to the bed at supervision level.  Call button and phone in reach with safety alarm in place.     Therapy Documentation Precautions:  Precautions Precautions: Fall Precaution Comments: mild R hemi, posterior bias Restrictions Weight Bearing Restrictions: No  Pain: Pain Assessment Pain Scale: Faces Pain Score: 0-No pain ADL: See Care Tool Section for some details of mobility and selfcare   Therapy/Group: Individual Therapy  Gee Habig OTR/L 12/13/2020, 10:44 AM

## 2020-12-14 MED ORDER — DOCUSATE SODIUM 100 MG PO CAPS
100.0000 mg | ORAL_CAPSULE | Freq: Every day | ORAL | Status: DC
  Administered 2020-12-14 – 2020-12-21 (×8): 100 mg via ORAL
  Filled 2020-12-14 (×8): qty 1

## 2020-12-14 MED ORDER — MORPHINE SULFATE (PF) 2 MG/ML IV SOLN
1.0000 mg | INTRAVENOUS | Status: DC | PRN
  Filled 2020-12-14: qty 1

## 2020-12-14 NOTE — Progress Notes (Signed)
Occupational Therapy Session Note  Patient Details  Name: Alec Sanchez MRN: [id number] Date of Birth: December 10, 1955  Today's Date: 12/14/2020 OT Individual Time: 4536-4680 OT Individual Time Calculation (min): 68 min    Short Term Goals: Week 1:  OT Short Term Goal 1 (Week 1): Pt will perform BSC/toilet transfer CGA with LRAD OT Short Term Goal 2 (Week 1): Pt will perform LB dress with hemitechnique CGA OT Short Term Goal 3 (Week 1): Pt will perform bathing tasks at shower level CGA with AE PRN  Skilled Therapeutic Interventions/Progress Updates:    Pt received semi-reclined in bed, no c/o pain, agreeable to therapy. Session focus on self-care retraining, activity tolerance, dynamic standing balance, energy conservation, RUE NMR in prep for improved ADL/IADL/func mobility performance + decreased caregiver burden. Came to sitting EOB with close S and increased time to initiate. Donned over shirt with S and increased time, donned pants with S at sit to stand level with RW. Short distance ambulation with RW throughout session with CGA and increased time.   Total A w/c transport to and from gym for time management/energy conservation.   Pt completed 5 continuous minutes on kinetron at 30 cm/sec. Required encouragement to continue as pt noted to significantly slow down at times. Reports activity at 7/10 on modified RPE. Played 4 rounds of corn hole, using RUE to toss bean bag. Retrieved bag from outside BOS from overhead/knee height, required increased time to retrieve due to slow gross motor movement. CGA and no AD for balance throughout. Pt required seated rest break due to fatigue in between rounds. Able to accurately tally up points on board, but overall required max A to keep track of previous round's points and to tally total.  Ambulated 80 ft with RW + CGA before required seated rest break to target activity tolerance and dynamic standing balance. Pt reports no concerns prior to DC next week,  but with prompting reports that he feels "weaker" than prior to CVA. Donned B teds total A, pt able to doff B gripper socks with S.  Pt left semi-reclined in bed  with bed alarm engaged, call bell in reach, and all immediate needs met.    Therapy Documentation Precautions:  Precautions Precautions: Fall Precaution Comments: mild R hemi, posterior bias Restrictions Weight Bearing Restrictions: No  Pain: denies   ADL: See Care Tool for more details.   Therapy/Group: Individual Therapy  Volanda Napoleon MS, OTR/L  12/14/2020, 6:49 AM

## 2020-12-14 NOTE — Progress Notes (Signed)
Physical Therapy Weekly Progress Note  Patient Details  Name: Alec Sanchez MRN: [id number] Date of Birth: May 23, 1955  Beginning of progress report period: December 07, 2020 End of progress report period: December 14, 2020  Today's Date: 12/14/2020 PT Individual Time: 0800-0842  PT Individual Time Calculation (min): 42 min   Patient has met 3 of 3 short term goals. Pt making appropriate progress towards LTG of mod I. He can complete bed mobility with supervision, sit<>stand transfers with CGA and RW, stand<>pivot transfers with CGA and RW, and can ambulate 163f with supervision and RW. He also demonstrates cognitive deficits such as delayed processing, decreased problem solving, and is VERY slow to move which requires extra time to complete simple functional mobility tasks. He has had several therapy refusals during this stay, limiting his progress. However, he's on track to meet his goals and anticipate continued progress pending participation.  Patient continues to demonstrate the following deficits muscle weakness, decreased cardiorespiratoy endurance, decreased motor planning, decreased initiation, decreased attention, decreased awareness, decreased problem solving, decreased safety awareness, and delayed processing, and decreased standing balance, hemiplegia, and decreased balance strategies and therefore will continue to benefit from skilled PT intervention to increase functional independence with mobility.  Patient progressing toward long term goals..  Continue plan of care.  PT Short Term Goals Week 1:  PT Short Term Goal 1 (Week 1): Pt will transfer sup to sit w/ min A PT Short Term Goal 1 - Progress (Week 1): Met PT Short Term Goal 2 (Week 1): Pt will transfer sit to stand w/ CGA w/o verbal cues for sequencing. PT Short Term Goal 2 - Progress (Week 1): Met PT Short Term Goal 3 (Week 1): Pt will amb w/ RW and CGA up to 75' PT Short Term Goal 3 - Progress (Week 1): Met Week 2:  PT  Short Term Goal 1 (Week 2): STG = LTG due to ELOS  Skilled Therapeutic Interventions/Progress Updates:     1st session: Pt supine in bed at start of session, agreeable to PT treatment. RN at bedside providing morning medications. Pt denies pain. Supine<>sit completed at supervision level with HOB slightly raised. Completes ambulatory transfer with supervision and RW within his room - cues needed for general safety awareness with RW and navigating room environment. Pt transported in w/c downstairs to 37M rehab gym. Completed gait training where he ambulated 2061fwith supervision and RW - gait speed 0.31 m/s indicative of increased falls risk and household ambulator. Cues during gait for increasing step/stride length, keeping body within walker frame, and increasing weight on forefoot to reduce posterior lean. He completed stair training where he navigated up/down x8 steps with CGA and 2 hand rails - need mod cues for safety awareness and technique - demonstrating reciprocal stepping for ascent and step-to stepping for descent. Needed cues for RUE awareness to guide along hand rail as he would forget to bring it with his body. Next, worked on cuWater engineer ambulated 2546fith supervision and RW and navigated 4inch curb with CGA and RW with mod cues for technique and safety. He was returned upstairs to his room and assisted back to bed via ambulatory transfer with supervision and RW. Completed bed mobility mod I with use of bed features. Remained semi-reclined in bed at completion of session with all needs in reach, bed alarm on. Informed of upcoming therapy schedule and encouraged continued participation with therapy.   2nd session: Pt refusing, reporting he's done too much therapy today. Unable to  convince or encourage him. He missed 30 minutes of skilled therapy due to refusal and fatigue.   Therapy Documentation Precautions:  Precautions Precautions: Fall Precaution Comments: mild R hemi, posterior  bias Restrictions Weight Bearing Restrictions: No General:    Therapy/Group: Individual Therapy  Alger Simons 12/14/2020, 7:31 AM

## 2020-12-14 NOTE — Progress Notes (Signed)
Physical Therapy Session Note  Patient Details  Name: Alec Sanchez MRN: [id number] Date of Birth: 06/07/1955  Today's Date: 12/14/2020 PT Individual Time: 1600-1610 PT Individual Time Calculation (min): 10 min   Short Term Goals:  Week 2:  PT Short Term Goal 1 (Week 2): STG = LTG due to ELOS  Skilled Therapeutic Interventions/Progress Updates:   Pt received supine in bed reporting stomach pain and nausea. Pt performed Roll in bed, and reports increasing pain in the  abdomin. PT assessed BP on mechanical Dinamap.  L arm 180/168 HR 82 R 188/161 HR 81 RN notified and performed manual BP assessment; and reports ~140/80.   Pt left supine in bed with RB present in room.      Therapy Documentation Precautions:  Precautions Precautions: Fall Precaution Comments: mild R hemi, posterior bias Restrictions Weight Bearing Restrictions: No General: PT Amount of Missed Time (min): 35 Minutes PT Missed Treatment Reason: Pain Vital Signs: Therapy Vitals Temp: 98.7 F (37.1 C) Temp Source: Oral Pulse Rate: 76 Resp: 16 BP: (!) 145/80 (manual bp) Patient Position (if appropriate): Lying Oxygen Therapy SpO2: 100 % O2 Device: Room Air Pain: Pain Assessment Pain Scale: 0-10 Pain Score: 8  Pain Location: Abdomen Pain Orientation: Mid Pain Descriptors / Indicators: Aching Patients Stated Pain Goal: 0 Pain Intervention(s): Medication (See eMAR)    Therapy/Group: Individual Therapy  Golden Pop 12/14/2020, 4:45 PM

## 2020-12-14 NOTE — Progress Notes (Signed)
PROGRESS NOTE   Subjective/Complaints: No more bloody stools Last BM yesterday Discussed with patient and nursing that GI discussed constipation is likely reason for bloody stool  ROS: Denies CP, SOB, +nausea reported as per nursing notes, no more bloody stools  Objective:   No results found. Recent Labs    12/12/20 1850 12/13/20 0541  WBC 13.7* 12.7*  HGB 16.2 15.3  HCT 48.5 45.8  PLT 434* 404*    Recent Labs    12/12/20 0435 12/13/20 0541  NA 135 134*  K 3.7 3.7  CL 98 98  CO2 25 26  GLUCOSE 116* 90  BUN 37* 27*  CREATININE 1.67* 1.58*  CALCIUM 9.6 9.3    Intake/Output Summary (Last 24 hours) at 12/14/2020 1423 Last data filed at 12/14/2020 0800 Gross per 24 hour  Intake 340 ml  Output 550 ml  Net -210 ml        Physical Exam: Vital Signs Blood pressure (!) 142/84, pulse 76, temperature 98.7 F (37.1 C), temperature source Oral, resp. rate 16, height 5\' 8"  (1.727 m), weight 77.6 kg, SpO2 100 %. Gen: no distress, normal appearing HEENT: oral mucosa pink and moist, NCAT Cardio: Reg rate Chest: normal effort, normal rate of breathing Abd: soft, non-distended Ext: no edema Psych: pleasant, normal affect Skin: intact Neuro: Alert Makes eye contact with examiner.   Speech is dysarthric but intelligible, stable.   Follows simple commands. Motor: 4-/5 grossly throughout, unchanged  Assessment/Plan: 1. Functional deficits which require 3+ hours per day of interdisciplinary therapy in a comprehensive inpatient rehab setting. Physiatrist is providing close team supervision and 24 hour management of active medical problems listed below. Physiatrist and rehab team continue to assess barriers to discharge/monitor patient progress toward functional and medical goals  Care Tool:  Bathing    Body parts bathed by patient: Right arm, Left arm, Chest, Abdomen, Front perineal area, Buttocks, Right upper  leg, Left upper leg, Face   Body parts bathed by helper: Buttocks, Right lower leg, Left lower leg     Bathing assist Assist Level: Moderate Assistance - Patient 50 - 74%     Upper Body Dressing/Undressing Upper body dressing   What is the patient wearing?: Pull over shirt    Upper body assist Assist Level: Set up assist    Lower Body Dressing/Undressing Lower body dressing      What is the patient wearing?: Pants     Lower body assist Assist for lower body dressing: Supervision/Verbal cueing     Toileting Toileting    Toileting assist Assist for toileting: Maximal Assistance - Patient 25 - 49%     Transfers Chair/bed transfer  Transfers assist     Chair/bed transfer assist level: Contact Guard/Touching assist     Locomotion Ambulation   Ambulation assist      Assist level: Supervision/Verbal cueing Assistive device: Walker-rolling Max distance: 175'   Walk 10 feet activity   Assist     Assist level: Supervision/Verbal cueing Assistive device: Walker-rolling   Walk 50 feet activity   Assist Walk 50 feet with 2 turns activity did not occur: Safety/medical concerns  Assist level: Supervision/Verbal cueing Assistive device: Walker-rolling  Walk 150 feet activity   Assist Walk 150 feet activity did not occur: Safety/medical concerns  Assist level: Supervision/Verbal cueing Assistive device: Walker-rolling    Walk 10 feet on uneven surface  activity   Assist     Assist level: Minimal Assistance - Patient > 75% Assistive device: Walker-rolling   Wheelchair     Assist Is the patient using a wheelchair?: Yes Type of Wheelchair: Manual    Wheelchair assist level: Maximal Assistance - Patient 25 - 49% Max wheelchair distance: 40    Wheelchair 50 feet with 2 turns activity    Assist        Assist Level: Total Assistance - Patient < 25%   Wheelchair 150 feet activity     Assist      Assist Level: Total  Assistance - Patient < 25%   Blood pressure (!) 142/84, pulse 76, temperature 98.7 F (37.1 C), temperature source Oral, resp. rate 16, height 5\' 8"  (1.727 m), weight 77.6 kg, SpO2 100 %.    Medical Problem List and Plan: 1.  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  Impaired mobility: -DVT/anticoagulation:  Pharmaceutical: Continue Heparin             -antiplatelet therapy: Aspirin and plavix held due to GI bleed 3. Low back pain: Lidoderm patch, Robaxin as needed muscle spasms. Kpad added.              Monitor with increased exertion 4. Depression: Continue Paxil 20 mg daily             -antipsychotic agents: N/A 5. Neuropsych: This patient is capable of making decisions on his own behalf. 6. Skin/Wound Care: Routine skin checks 7. Fluids/Electrolytes/Nutrition: Routine in and outs             CMP ordered for tomorrow a.m. 8.  Hypertension.  Norvasc 10 mg daily, Hygroton 25 mg daily.  Added clonidine patch for HTN/low back pain  Hydralazine 10 3 times daily started on 10/16  Elevated on 10/17. Maintain clonidine 0.1mg  for 1 week and adjust at that point if needed. Continue to monitor for effects of yesterday's change             Monitor with increased mobility 9.  Hyperlipidemia: Continue Lipitor 10.  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  Follow-up outpatient 11.  History of tobacco use as well as marijuana.  Urine drug screen positive marijuana.  Counseling 12.  History of hematuria/renal colic.  Continue to monitor 13.  Medical noncompliance.  Counseling 14. AKI:  Cr improved to 1.58, repeat tomorrow.  6- 8 glasses of water per day-  placed nursing order 15. Overweight: BMI 26.65: provide dietary education 16.  Transaminitis  Downtrending, discussed with patient.  17.  Slow transit constipation: continue miralax BID. Added prune juice with meals and colace daily. 18. GI bleed: started on daily protonix. Plavix resumed as per GI. Discussed with GI- most likely due to constipation. Resolved.   LOS: 8 days A FACE TO FACE EVALUATION WAS PERFORMED  04/14/2020 Alec Sanchez 12/14/2020, 2:23 PM

## 2020-12-14 NOTE — Progress Notes (Signed)
Patient ID: Alec Sanchez, male   DOB: [birth year], 65 y.o.   MRN: [id number] Have sent equipment and home health follow up needs to [name] liaison to approve. Pt has no preference regarding home health have made referral to [facility].

## 2020-12-14 NOTE — Plan of Care (Signed)
  Problem: RH BOWEL ELIMINATION Goal: RH STG MANAGE BOWEL WITH ASSISTANCE Description: STG Manage Bowel with mod I Assistance. Outcome: Progressing; no black stools noted ; LBM 10/19

## 2020-12-14 NOTE — Progress Notes (Signed)
Occupational Therapy Session Note  Patient Details  Name: Maryland Luppino MRN: [id number] Date of Birth: 1955-05-14  Today's Date: 12/14/2020 OT Missed Time: 30 Minutes Missed Time Reason: Patient fatigue      Skilled Therapeutic Interventions/Progress Updates:    Pt semi reclined in bed, shaking his head no when therapist arrived stating, "I already had three therapy sessions today, dont tell me I have another one".  Pt refusing despite encouragement to participate during this OT session due to fatigue. Missed 30 minutes of treatment.  Therapy Documentation Precautions:  Precautions Precautions: Fall Precaution Comments: mild R hemi, posterior bias Restrictions Weight Bearing Restrictions: No    Therapy/Group: Individual Therapy  Amie Critchley 12/14/2020, 12:55 PM

## 2020-12-15 LAB — BASIC METABOLIC PANEL
Anion gap: 9 (ref 5–15)
BUN: 22 mg/dL (ref 8–23)
CO2: 29 mmol/L (ref 22–32)
Calcium: 9.6 mg/dL (ref 8.9–10.3)
Chloride: 96 mmol/L — ABNORMAL LOW (ref 98–111)
Creatinine, Ser: 1.38 mg/dL — ABNORMAL HIGH (ref 0.61–1.24)
GFR, Estimated: 56 mL/min — ABNORMAL LOW (ref >60)
Glucose, Bld: 96 mg/dL (ref 70–99)
Potassium: 3.4 mmol/L — ABNORMAL LOW (ref 3.5–5.1)
Sodium: 134 mmol/L — ABNORMAL LOW (ref 135–145)

## 2020-12-15 NOTE — Progress Notes (Signed)
Occupational Therapy Weekly Progress Note  Patient Details  Name: Alec Sanchez MRN: [id number] Date of Birth: 09-07-1955  Beginning of progress report period: December 07, 2020 End of progress report period: December 15, 2020  Today's Date: 12/15/2020 OT Individual Time: 0130-0230 OT Individual Time Calculation (min): 60 min    Patient has met 4 of 4 short term goals.  Pt steadily improving with decreased need for Vcs for sequencing and initiation of tasks.  Overall improved motor processing as well however still exhibits mild to moderate delay.  Pt requires supervision for all basic functional transfers and mobility and intermittent CGA during LB bathing at shower level.  Patient continues to demonstrate the following deficits: muscle weakness, decreased cardiorespiratoy endurance, decreased coordination and decreased motor planning, decreased problem solving and delayed processing, and decreased sitting balance, decreased standing balance, decreased postural control, and decreased balance strategies and therefore will continue to benefit from skilled OT intervention to enhance overall performance with BADL.  Patient progressing toward long term goals..  Continue plan of care.  OT Short Term Goals Week 1:  OT Short Term Goal 1 (Week 1): Pt will perform BSC/toilet transfer CGA with LRAD OT Short Term Goal 1 - Progress (Week 1): Met OT Short Term Goal 2 (Week 1): Pt will perform LB dress with hemitechnique CGA OT Short Term Goal 2 - Progress (Week 1): Met OT Short Term Goal 3 (Week 1): Pt will perform bathing tasks at shower level CGA with AE PRN OT Short Term Goal 3 - Progress (Week 1): Met Week 2:  OT Short Term Goal 1 (Week 2): STGs=LTGs due to ELOS  Skilled Therapeutic Interventions/Progress Updates:    Pt semi reclined in bed, sister just leaving as therapist arrived.  Pt needing some encouragement but ultimately receptive to participating in shower level bathing during OT session.  Pt  completed all functional mobility throughout session with supervision and increased time due to delayed motor processing.  Pt completed supine to sit, sit to stand at RW, ambulation to bathroom approx 10 feet, stand to sit on shower bench, lateral scoot over shower threshold.  Pt doffed shirt with mod I.  Doffed pants, brief, and socks with CGA due to tight space.  Pt bathed UB with standby supervision due to dropping shower head on one occasion. Pt bathed LB with CGA during standing portion to wash buttocks and peri region.  Donned shirt with setup, brief and pants with close supervision.  Pt requesting to return to bed at end of session.  Call bell in reach, bed alarm on.    Therapy Documentation Precautions:  Precautions Precautions: Fall Precaution Comments: mild R hemi, posterior bias Restrictions Weight Bearing Restrictions: No   Therapy/Group: Individual Therapy  Ezekiel Slocumb 12/15/2020, 4:43 PM

## 2020-12-15 NOTE — Progress Notes (Signed)
Physical Therapy Session Note  Patient Details  Name: Alec Sanchez MRN: [id number] Date of Birth: 13-Nov-1955  Today's Date: 12/15/2020 PT Individual Time: 6599-3570 PT Individual Time Calculation (min): 30 min   Short Term Goals: Week 2:  PT Short Term Goal 1 (Week 2): STG = LTG due to ELOS  Skilled Therapeutic Interventions/Progress Updates:     Pt presents supine in bed on arrival with all lights off in room. He initially refuses therapy and requires encouragement and direct cues to participate, ultimately agreeable. Completes supine<>sit with supervision with HOB slightly elevated, slow to move. Completes sit<>stand transfer with supervision to RW and then ambulates from his room to [facility] rehab gym, ~166ft, with supervision and RW - gait deficits continue to show decreased bilateral step length, significantly decreased gait speed, and mild posterior lean with weight on heels rather than forefoot. VC throughout for gait corrections. Pt assisted onto Nustep and completed 10 minutes at workload 3, focusing on maintaining a higher cadence to have carryover into functional gait - his self selected cadence on Nustep is around 12 steps/minute - able to achieve ~20 steps/minute when instructed and he completed a total of 118 steps in 10 minutes. Sit<>Stand to RW from Nustep with supervision and ambulates back to his room, ~165ft, with close supervision and RW - minimal carryover from Nustep task into functional gait although he did demonstrate a few self made efforts to lengthen his stride. Pt refusing to participate in further therapy, contributing to fatigue, requesting to lay back in bed. Bed mobility completed mod I with HOB flat and use of bed rail. Bed alarm on, all needs in reach. He missed 45 minutes of skilled therapy due to refusal.   Therapy Documentation Precautions:  Precautions Precautions: Fall Precaution Comments: mild R hemi, posterior bias Restrictions Weight Bearing Restrictions:  No General:     Therapy/Group: Individual Therapy  [name] 12/15/2020, 7:37 AM

## 2020-12-15 NOTE — Progress Notes (Signed)
Attempted to see patient today but he was not in his room. Chart reviewed and no issues reported. I will see him tomorrow morning.

## 2020-12-15 NOTE — Progress Notes (Signed)
Physical Therapy Session Note  Patient Details  Name: Knowledge Escandon MRN: [id number] Date of Birth: 17-Dec-1955  Today's Date: 12/15/2020 PT Individual Time: 0901-0955 PT Individual Time Calculation (min): 54 min   Short Term Goals: Week 1:  PT Short Term Goal 1 (Week 1): Pt will transfer sup to sit w/ min A PT Short Term Goal 1 - Progress (Week 1): Met PT Short Term Goal 2 (Week 1): Pt will transfer sit to stand w/ CGA w/o verbal cues for sequencing. PT Short Term Goal 2 - Progress (Week 1): Met PT Short Term Goal 3 (Week 1): Pt will amb w/ RW and CGA up to 75' PT Short Term Goal 3 - Progress (Week 1): Met Week 2:  PT Short Term Goal 1 (Week 2): STG = LTG due to ELOS  Skilled Therapeutic Interventions/Progress Updates:   Pt received supine in bed and agreeable to PT, with encouragement.. Supine>sit transfer without assist and or cues. Pt donned pants sitting EOB with supervision assist for safety.  Stand pivot transfer to [facility] with supervision assist from PT for safety. Pt transported to rehab gym in [facility]. Gait training with RW and supervision assist x 184f. Cues for AD management in turns once fatigued.  Pt engaged in gross motor/problem solving task of Wii bowling 4 frames sitting, 3 frames standing with supervision assist for balance and min-mod cues for use of remote on each frame with the RUE. 1-2 UE supported on RW throughout for balance.   Pt returned to room and performed stand pviot transfer to bed with supervision assist and no AD. Sit>supine completed without assist, and left supine in bed with call bell in reach and all needs met.         Therapy Documentation Precautions:  Precautions Precautions: Fall Precaution Comments: mild R hemi, posterior bias Restrictions Weight Bearing Restrictions: No  Pain:  denies   Therapy/Group: Individual Therapy  ALorie Phenix10/21/2022, 10:12 AM

## 2020-12-15 NOTE — Plan of Care (Signed)
  Problem: Consults Goal: RH STROKE PATIENT EDUCATION Description: See Patient Education module for education specifics  Outcome: Progressing   Problem: RH BOWEL ELIMINATION Goal: RH STG MANAGE BOWEL WITH ASSISTANCE Description: STG Manage Bowel with mod I Assistance. Outcome: Progressing Goal: RH STG MANAGE BOWEL W/MEDICATION W/ASSISTANCE Description: STG Manage Bowel with Medication with Assistance. Outcome: Progressing   Problem: RH BLADDER ELIMINATION Goal: RH STG MANAGE BLADDER WITH ASSISTANCE Description: STG Manage Bladder With no Assistance Outcome: Progressing   Problem: RH SAFETY Goal: RH STG ADHERE TO SAFETY PRECAUTIONS W/ASSISTANCE/DEVICE Description: STG Adhere to Safety Precautions With cues Assistance/Device. Outcome: Progressing   Problem: RH PAIN MANAGEMENT Goal: RH STG PAIN MANAGED AT OR BELOW PT'S PAIN GOAL Description: At or below level 4 Outcome: Progressing   Problem: RH KNOWLEDGE DEFICIT Goal: RH STG INCREASE KNOWLEDGE OF HYPERTENSION Description: Patient will be able to manage HTN with medications and dietary modifications using handouts and educational resources independently Outcome: Progressing Goal: RH STG INCREASE KNOWLEGDE OF HYPERLIPIDEMIA Description: Patient will be able to manage HLD with medications and dietary modifications using handouts and educational resources independently Outcome: Progressing Goal: RH STG INCREASE KNOWLEDGE OF STROKE PROPHYLAXIS Description: Patient will be able to manage secondary stroke risks with medications and dietary modifications using handouts and educational resources independently Outcome: Progressing   

## 2020-12-16 MED ORDER — POTASSIUM CHLORIDE 20 MEQ PO PACK
20.0000 meq | PACK | Freq: Once | ORAL | Status: AC
  Administered 2020-12-16: 20 meq via ORAL
  Filled 2020-12-16: qty 1

## 2020-12-16 NOTE — Progress Notes (Signed)
PROGRESS NOTE   Subjective/Complaints: Doing well, no complaints  Eating lunch Cr continuing to decrease.  K+ low at 3.4 yesterday- will supplement  ROS: Denies CP, SOB, +nausea reported as per nursing notes, no more bloody stools  Objective:   No results found. No results for input(s): WBC, HGB, HCT, PLT in the last 72 hours.   Recent Labs    12/15/20 0716  NA 134*  K 3.4*  CL 96*  CO2 29  GLUCOSE 96  BUN 22  CREATININE 1.38*  CALCIUM 9.6    Intake/Output Summary (Last 24 hours) at 12/16/2020 1237 Last data filed at 12/16/2020 0353 Gross per 24 hour  Intake 500 ml  Output 500 ml  Net 0 ml        Physical Exam: Vital Signs Blood pressure 128/75, pulse 74, temperature 98.2 F (36.8 C), temperature source Oral, resp. rate 17, height 5\' 6"  (1.676 m), weight 76.1 kg, SpO2 100 %. Gen: no distress, normal appearing HEENT: oral mucosa pink and moist, NCAT Cardio: Reg rate Chest: normal effort, normal rate of breathing Abd: soft, non-distended Ext: no edema Psych: pleasant, normal affect Skin: intact Neuro: Makes eye contact with examiner.   Speech is dysarthric but intelligible, stable.   Follows simple commands. Motor: 4-/5 grossly throughout, unchanged  Assessment/Plan: 1. Functional deficits which require 3+ hours per day of interdisciplinary therapy in a comprehensive inpatient rehab setting. Physiatrist is providing close team supervision and 24 hour management of active medical problems listed below. Physiatrist and rehab team continue to assess barriers to discharge/monitor patient progress toward functional and medical goals  Care Tool:  Bathing    Body parts bathed by patient: Right arm, Left arm, Chest, Abdomen, Front perineal area, Buttocks, Right upper leg, Left upper leg, Face   Body parts bathed by helper: Buttocks, Right lower leg, Left lower leg     Bathing assist Assist Level:  Moderate Assistance - Patient 50 - 74%     Upper Body Dressing/Undressing Upper body dressing   What is the patient wearing?: Pull over shirt    Upper body assist Assist Level: Set up assist    Lower Body Dressing/Undressing Lower body dressing      What is the patient wearing?: Pants     Lower body assist Assist for lower body dressing: Supervision/Verbal cueing     Toileting Toileting    Toileting assist Assist for toileting: Maximal Assistance - Patient 25 - 49%     Transfers Chair/bed transfer  Transfers assist     Chair/bed transfer assist level: Contact Guard/Touching assist     Locomotion Ambulation   Ambulation assist      Assist level: Supervision/Verbal cueing Assistive device: Walker-rolling Max distance: 175'   Walk 10 feet activity   Assist     Assist level: Supervision/Verbal cueing Assistive device: Walker-rolling   Walk 50 feet activity   Assist Walk 50 feet with 2 turns activity did not occur: Safety/medical concerns  Assist level: Supervision/Verbal cueing Assistive device: Walker-rolling    Walk 150 feet activity   Assist Walk 150 feet activity did not occur: Safety/medical concerns  Assist level: Supervision/Verbal cueing Assistive device: Walker-rolling  Walk 10 feet on uneven surface  activity   Assist     Assist level: Minimal Assistance - Patient > 75% Assistive device: Walker-rolling   Wheelchair     Assist Is the patient using a wheelchair?: Yes Type of Wheelchair: Manual    Wheelchair assist level: Maximal Assistance - Patient 25 - 49% Max wheelchair distance: 40    Wheelchair 50 feet with 2 turns activity    Assist        Assist Level: Total Assistance - Patient < 25%   Wheelchair 150 feet activity     Assist      Assist Level: Total Assistance - Patient < 25%   Blood pressure 128/75, pulse 74, temperature 98.2 F (36.8 C), temperature source Oral, resp. rate 17,  height 5\' 6"  (1.676 m), weight 76.1 kg, SpO2 100 %.    Medical Problem List and Plan: 1.  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  Impaired mobility: -DVT/anticoagulation:  Pharmaceutical: Continue Heparin             -antiplatelet therapy: Aspirin and plavix held due to GI bleed 3. Low back pain: Lidoderm patch, Robaxin as needed muscle spasms. Kpad added.              Monitor with increased exertion 4. Depression: Continue Paxil 20 mg daily             -antipsychotic agents: N/A 5. Neuropsych: This patient is capable of making decisions on his own behalf. 6. Skin/Wound Care: Routine skin checks 7. Fluids/Electrolytes/Nutrition: Routine in and outs             CMP ordered for tomorrow a.m. 8.  Hypertension.  Norvasc 10 mg daily, Hygroton 25 mg daily.  Added clonidine patch for HTN/low back pain  Hydralazine 10 3 times daily started on 10/16  Maintain clonidine 0.1mg  for 1 week and adjust at that point if needed. Continue to monitor for effects of yesterday's change  Much better controlled  11/16 daily potassium supplement started for hypokalemia, will also help HTN             Monitor with increased mobility 9.  Hyperlipidemia: Continue Lipitor 10.  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  Follow-up outpatient 11.  History of tobacco use as well as marijuana.  Urine drug screen positive marijuana.  Counseling 12.  History of hematuria/renal colic.  Continue to monitor 13.  Medical noncompliance.  Counseling 14. AKI:  Cr improved to 1.58, repeat tomorrow.  6- 8 glasses of water per day- placed nursing order 15. Overweight: BMI 26.65: provide  dietary education 16.  Transaminitis  Downtrending, discussed with patient.  17.  Slow transit constipation: continue miralax BID. Added prune juice with meals and colace daily. 18. GI bleed: started on daily protonix. Plavix resumed as per GI. Discussed with GI- most likely due to constipation. Resolved.  19. Hypokalemia: 04/14/2020 daily supplement started  LOS: 10 days A FACE TO FACE EVALUATION WAS PERFORMED  12/16/2020, 12:37 PM

## 2020-12-16 NOTE — Progress Notes (Signed)
Occupational Therapy Session Note  Patient Details  Name: Alec Sanchez MRN: [id number] Date of Birth: 11/26/1955  Today's Date: 12/17/2020 OT Individual Time: 1300-1333 OT Individual Time Calculation (min): 33 min  12 minutes missed  Skilled Therapeutic Interventions/Progress Updates:    Pt greeted in the recliner, finishing up his lunch and watching the football game. Significant amount of time spent at the beginning of session to establish rapport as pt wanted to refuse to participate. Encouraged him to engage in IADL activity due to OT goals and his goal of being independent at home. Pt expressed having interests pertaining to bowling and boxing, invited him to attempt these activities on the wii. Pt ultimately only agreeable to participate as long as he could remain in the room to watch football. Therefore guided him through UB exercises using 3# dumbbell x15 reps 2 sets each exercise to increase UE strength/endurance needed for self care activity. Pt opted to terminate session early due to fatigue. Tx time missed.  Therapy Documentation Precautions:  Precautions Precautions: Fall Precaution Comments: mild R hemi, posterior bias Restrictions Weight Bearing Restrictions: No  Pain: pt denied having pain Pain Assessment Pain Scale: 0-10 Pain Score: 0-No pain ADL: ADL Eating: Not assessed Grooming: Supervision/safety Upper Body Bathing: Minimal assistance Lower Body Bathing: Moderate assistance Upper Body Dressing: Minimal assistance Lower Body Dressing: Moderate assistance Toileting: Moderate assistance Toilet Transfer: Minimal assistance Toilet Transfer Method: Stand pivot  Therapy/Group: Individual Therapy  Paelyn Smick A Yaniris Braddock 12/17/2020, 3:31 PM

## 2020-12-16 NOTE — Plan of Care (Signed)
  Problem: Consults Goal: RH STROKE PATIENT EDUCATION Description: See Patient Education module for education specifics  Outcome: Progressing   Problem: RH BOWEL ELIMINATION Goal: RH STG MANAGE BOWEL WITH ASSISTANCE Description: STG Manage Bowel with mod I Assistance. Outcome: Progressing Goal: RH STG MANAGE BOWEL W/MEDICATION W/ASSISTANCE Description: STG Manage Bowel with Medication with Assistance. Outcome: Progressing   Problem: RH BLADDER ELIMINATION Goal: RH STG MANAGE BLADDER WITH ASSISTANCE Description: STG Manage Bladder With no Assistance Outcome: Progressing   Problem: RH SAFETY Goal: RH STG ADHERE TO SAFETY PRECAUTIONS W/ASSISTANCE/DEVICE Description: STG Adhere to Safety Precautions With cues Assistance/Device. Outcome: Progressing   Problem: RH PAIN MANAGEMENT Goal: RH STG PAIN MANAGED AT OR BELOW PT'S PAIN GOAL Description: At or below level 4 Outcome: Progressing   Problem: RH KNOWLEDGE DEFICIT Goal: RH STG INCREASE KNOWLEDGE OF HYPERTENSION Description: Patient will be able to manage HTN with medications and dietary modifications using handouts and educational resources independently Outcome: Progressing Goal: RH STG INCREASE KNOWLEGDE OF HYPERLIPIDEMIA Description: Patient will be able to manage HLD with medications and dietary modifications using handouts and educational resources independently Outcome: Progressing Goal: RH STG INCREASE KNOWLEDGE OF STROKE PROPHYLAXIS Description: Patient will be able to manage secondary stroke risks with medications and dietary modifications using handouts and educational resources independently Outcome: Progressing   

## 2020-12-17 NOTE — Plan of Care (Signed)
  Problem: Consults Goal: RH STROKE PATIENT EDUCATION Description: See Patient Education module for education specifics  Outcome: Progressing   Problem: RH BOWEL ELIMINATION Goal: RH STG MANAGE BOWEL WITH ASSISTANCE Description: STG Manage Bowel with mod I Assistance. Outcome: Progressing Goal: RH STG MANAGE BOWEL W/MEDICATION W/ASSISTANCE Description: STG Manage Bowel with Medication with Assistance. Outcome: Progressing   Problem: RH BLADDER ELIMINATION Goal: RH STG MANAGE BLADDER WITH ASSISTANCE Description: STG Manage Bladder With no Assistance Outcome: Progressing   Problem: RH SAFETY Goal: RH STG ADHERE TO SAFETY PRECAUTIONS W/ASSISTANCE/DEVICE Description: STG Adhere to Safety Precautions With cues Assistance/Device. Outcome: Progressing   Problem: RH PAIN MANAGEMENT Goal: RH STG PAIN MANAGED AT OR BELOW PT'S PAIN GOAL Description: At or below level 4 Outcome: Progressing   Problem: RH KNOWLEDGE DEFICIT Goal: RH STG INCREASE KNOWLEDGE OF HYPERTENSION Description: Patient will be able to manage HTN with medications and dietary modifications using handouts and educational resources independently Outcome: Progressing Goal: RH STG INCREASE KNOWLEGDE OF HYPERLIPIDEMIA Description: Patient will be able to manage HLD with medications and dietary modifications using handouts and educational resources independently Outcome: Progressing Goal: RH STG INCREASE KNOWLEDGE OF STROKE PROPHYLAXIS Description: Patient will be able to manage secondary stroke risks with medications and dietary modifications using handouts and educational resources independently Outcome: Progressing   

## 2020-12-17 NOTE — Progress Notes (Signed)
Physical Therapy Session Note  Patient Details  Name: Alec Sanchez MRN: [id number] Date of Birth: 1955/11/07  Today's Date: 12/17/2020 PT Individual Time: 0905-0920 and 1200-1230 PT Individual Time Calculation (min): 15 min and 30 min  Short Term Goals: Week 2:  PT Short Term Goal 1 (Week 2): STG = LTG due to ELOS  Skilled Therapeutic Interventions/Progress Updates:     Session 1: Patient sitting up in the bed eating breakfast with RN providing medications upon PT arrival. Patient alert and agreeable to PT session. Patient denied pain during session.  Patient reports poor sleep at baseline with increased difficulty sleeping in the hospital. Noted room to have blinds closed and lights dim this morning. Educated on benefits of environmental stimulation during the day and reduced stimulation at night to improve sleep/wake cycle. Opened blinds and turning on overhead lights in the room and placed sign on patient's door for "lights up, blinds up during the day; lights off, TV off at night, and discourage daytime napping." Educated nursing staff on assisting patient with this to improve sleep/wake cycle as able. Offered to have patient eat sitting EOB or in the recliner, patient declined due to fatigue from lack of sleep. He reports that he does better with therapies in the afternoon. Patient in agreement to mobilize with this therapist this afternoon if PT is able to return. Plan to return at 12:30 to complete remainder of session.    Patient sitting up in bed eating breakfast at end of session with breaks locked, bed alarm set, and all needs within reach.   Session 2: Patient asleep in bed upon PT arrival. Patient easily aroused and agreeable to PT session. Patient denied pain throughout session. Reminded patient of goal for reduced day time sleeping to improve night time sleep, patient stated understanding and unable to recall how long he had been sleeping.   Focused session on functional  household mobility and standing balance during ADLs. Performed all mobility with distant supervision for balance using RW.  -sit to/from stand from bed x2, toilet x1, and recliner x2 -dynamic sitting balance EOB doffed/donned shirt and performed upper body bathing with set-up assist, except washing his back with total A, donned B TED hose and non-skid socks with total A for energy/time management -standing balance with single upper extremity support on RW, patient perform peri-care due to bladder incontinence with set-up assist, perform lower body clothing management with total A for brief and supervision for pants, patient reported need for BM during peri-care -ambulated >15 feet x2 to/from the bathroom, opened bathroom door and lowered toilet seat with min cues to simulate home mobility/activities -patient was continent of bowl and bladder during toileting, performed peri-care in standing with set-up assist and supervision for balance -doffed/donned pants with set-up assist, in standing patient reached for his phone to answer it and stood to talk to his daughter >2 min with single or no upper extremity support  Patient sitting up in the recliner with RN in the room at end of session with breaks locked, chair alarm set, and all needs in reach.  Therapy Documentation Precautions:  Precautions Precautions: Fall Precaution Comments: mild R hemi, posterior bias Restrictions Weight Bearing Restrictions: No    Therapy/Group: Individual Therapy  [name] PT, DPT  12/17/2020, 9:41 AM

## 2020-12-18 LAB — BASIC METABOLIC PANEL
Anion gap: 10 (ref 5–15)
BUN: 19 mg/dL (ref 8–23)
CO2: 28 mmol/L (ref 22–32)
Calcium: 9.3 mg/dL (ref 8.9–10.3)
Chloride: 95 mmol/L — ABNORMAL LOW (ref 98–111)
Creatinine, Ser: 1.34 mg/dL — ABNORMAL HIGH (ref 0.61–1.24)
GFR, Estimated: 58 mL/min — ABNORMAL LOW (ref >60)
Glucose, Bld: 110 mg/dL — ABNORMAL HIGH (ref 70–99)
Potassium: 3.6 mmol/L (ref 3.5–5.1)
Sodium: 133 mmol/L — ABNORMAL LOW (ref 135–145)

## 2020-12-18 NOTE — Progress Notes (Signed)
Physical Therapy Session Note  Patient Details  Name: Alec Sanchez MRN: [id number] Date of Birth: 01/09/1956  Today's Date: 12/18/2020 PT Individual Time: 0800-0854 PT Individual Time Calculation (min): 54 min   Short Term Goals: Week 1:  PT Short Term Goal 1 (Week 1): Pt will transfer sup to sit w/ min A PT Short Term Goal 1 - Progress (Week 1): Met PT Short Term Goal 2 (Week 1): Pt will transfer sit to stand w/ CGA w/o verbal cues for sequencing. PT Short Term Goal 2 - Progress (Week 1): Met PT Short Term Goal 3 (Week 1): Pt will amb w/ RW and CGA up to 75' PT Short Term Goal 3 - Progress (Week 1): Met Week 2:  PT Short Term Goal 1 (Week 2): STG = LTG due to ELOS  Skilled Therapeutic Interventions/Progress Updates:  Pt received supine in bed, denied pain and was agreeable to PT. Emphasis of session on transfers and stairs to prepare for DC home safely. Pt performed supine <>sit mod I w/bedrail and sit <>stand pivot to WC w/RW and CGA. Noted significant posterior lean while standing, min tactile cues provided for anterior weight shift. Pt transported to ortho gym w/total A for time management. Pt performed sit <>stands from [facility] throughout session w/S* and proper hand placement without cues.   Car Transfer -Pt performed car transfer ([place] height) w/S* and RW, demonstrated proper technique, no cues provided. Educated pt on folding RW and placing it into car w/him, pt unable to successfully fold walker himself to bring it into car.   Stair Training -Pt ascended/descended 4 6" steps x2 w/BUE support on rails, step-to pattern and CGA. Mod verbal cues provided on first attempt for management of RW while approaching steps and correct BLE sequence for safety. Progressed to min verbal cues on second attempt. Noted pt had difficulty clearing step w/RLE while descending despite verbal cues.   Pt reported urgency to have BM, transported back to room w/total A. Pt performed sit <>stand pivot to toilet  w/S* and voided continently. Sit <>stand w/S* and pt performed peri care w/set-up assist. Pt performed lower body dressing while seated w/max A. Pt ambulated 10' to recliner w/RW and S* and was left seated in recliner, all needs in reach.   Therapy Documentation Precautions:  Precautions Precautions: Fall Precaution Comments: mild R hemi, posterior bias Restrictions Weight Bearing Restrictions: No   Therapy/Group: Individual Therapy Cruzita Lederer Anubis Fundora, PT, DPT  12/18/2020, 7:42 AM

## 2020-12-18 NOTE — Progress Notes (Signed)
Alec NOTE   Subjective/Complaints: K+ back to nl Pt states Right side weakness related to prior Sanchez   ROS: Denies CP, SOB, +nausea reported as per nursing notes, no more bloody stools  Objective:   No results found. No results for input(s): WBC, HGB, HCT, PLT in the last 72 hours.   Recent Labs    12/18/20 0735  NA 133*  K 3.6  CL 95*  CO2 28  GLUCOSE 110*  BUN 19  CREATININE 1.34*  CALCIUM 9.3     Intake/Output Summary (Last 24 hours) at 12/18/2020 0859 Last data filed at 12/18/2020 0803 Gross per 24 hour  Intake 1060 ml  Output 775 ml  Net 285 ml         Physical Exam: Vital Signs Blood pressure (!) 143/90, pulse 75, temperature (!) 97.4 F (36.3 C), temperature source Oral, resp. rate 18, height 5\' 6"  (1.676 m), weight 77 kg, SpO2 100 %.  General: No acute distress Mood and affect are appropriate Heart: Regular rate and rhythm no rubs murmurs or extra sounds Lungs: Clear to auscultation, breathing unlabored, no rales or wheezes Abdomen: Positive bowel sounds, soft nontender to palpation, nondistended Extremities: No clubbing, cyanosis, or edema Skin: No evidence of breakdown, no evidence of rash   Neuro:Sensation intact to LT  Makes eye contact with examiner.   Speech is dysarthric but intelligible, stable.   Follows simple commands. Motor: 4-/5 RUE and RLE 5/5 on left side  unchanged  Assessment/Plan: 1. Functional deficits which require 3+ hours per day of interdisciplinary therapy in a comprehensive inpatient rehab setting. Physiatrist is providing close team supervision and 24 hour management of active medical problems listed below. Physiatrist and rehab team continue to assess barriers to discharge/monitor patient Alec toward functional and medical goals  Care Tool:  Bathing    Body parts bathed by patient: Right arm, Left arm, Chest, Abdomen, Front perineal area, Buttocks,  Right upper leg, Left upper leg, Face   Body parts bathed by helper: Buttocks, Right lower leg, Left lower leg     Bathing assist Assist Level: Moderate Assistance - Patient 50 - 74%     Upper Body Dressing/Undressing Upper body dressing   What is the patient wearing?: Pull over shirt    Upper body assist Assist Level: Set up assist    Lower Body Dressing/Undressing Lower body dressing      What is the patient wearing?: Pants     Lower body assist Assist for lower body dressing: Supervision/Verbal cueing     Toileting Toileting    Toileting assist Assist for toileting: Maximal Assistance - Patient 25 - 49%     Transfers Chair/bed transfer  Transfers assist     Chair/bed transfer assist level: Supervision/Verbal cueing     Locomotion Ambulation   Ambulation assist      Assist level: Supervision/Verbal cueing Assistive device: Walker-rolling Max distance: 15 ft   Walk 10 feet activity   Assist     Assist level: Supervision/Verbal cueing Assistive device: Walker-rolling   Walk 50 feet activity   Assist Walk 50 feet with 2 turns activity did not occur: Safety/medical concerns  Assist level: Supervision/Verbal cueing Assistive  device: Walker-rolling    Walk 150 feet activity   Assist Walk 150 feet activity did not occur: Safety/medical concerns  Assist level: Supervision/Verbal cueing Assistive device: Walker-rolling    Walk 10 feet on uneven surface  activity   Assist     Assist level: Minimal Assistance - Patient > 75% Assistive device: Walker-rolling   Wheelchair     Assist Is the patient using a wheelchair?: Yes Type of Wheelchair: Manual    Wheelchair assist level: Maximal Assistance - Patient 25 - 49% Max wheelchair distance: 40    Wheelchair 50 feet with 2 turns activity    Assist        Assist Level: Total Assistance - Patient < 25%   Wheelchair 150 feet activity     Assist      Assist Level:  Total Assistance - Patient < 25%   Blood pressure (!) 143/90, pulse 75, temperature (!) 97.4 F (36.3 C), temperature source Oral, resp. rate 18, height 5\' 6"  (1.676 m), weight 77 kg, SpO2 100 %.    Medical Problem List and Plan: 1.  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  Impaired mobility: -DVT/anticoagulation:  Pharmaceutical: Continue Heparin             -antiplatelet therapy: Aspirin and plavix held due to GI bleed 3. Low back pain: Lidoderm patch, Robaxin as needed muscle spasms. Kpad added.              Monitor with increased exertion 4. Depression: Continue Paxil 20 mg daily             -antipsychotic agents: N/A 5. Neuropsych: This patient is capable of making decisions on his own behalf. 6. Skin/Wound Care: Routine skin checks 7. Fluids/Electrolytes/Nutrition: Routine in and outs           K+ back to nl , creat mild elevation  8.  Hypertension.  Norvasc 10 mg daily, [place] 25 mg daily.  Added clonidine patch for HTN/low back pain  Hydralazine 10 3 times daily started on 10/16  Maintain clonidine 0.1mg  for 1 week and adjust at that point if needed. Continue to monitor for effects of yesterday's change  Much better controlled Vitals:   12/18/20 0620 12/18/20 0746  BP: 122/77 (!) 143/90  Pulse:  75  Resp:    Temp:    SpO2:      9.  Hyperlipidemia: Continue Lipitor 10.  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  Follow-up outpatient 11.  History of tobacco use as well as marijuana.  Urine drug screen positive marijuana.  Counseling 12.  History of hematuria/renal colic.  Continue to monitor 13.  Medical  noncompliance.  Counseling 14. AKI:  Cr improved to 1.34, improving  6- 8 glasses of water per day- placed nursing order 15. Overweight: BMI 26.65: provide dietary education 16.  Transaminitis  Downtrending, discussed with patient.  17.  Slow transit constipation: continue miralax BID. Added prune juice with meals and colace daily. 18. GI bleed: started on daily protonix. Plavix resumed as per GI. Discussed with GI- most likely due to constipation. Resolved. Hgb normal 10/19 recheck in am  19. Hypokalemia resolved : 11/19 daily supplement  started  LOS: 12 days A FACE TO FACE EVALUATION WAS PERFORMED  Erick Colace 12/18/2020, 8:59 AM

## 2020-12-18 NOTE — Progress Notes (Signed)
Occupational Therapy Session Note  Patient Details  Name: Alec Sanchez MRN: [id number] Date of Birth: 09/29/1955  Today's Date: 12/18/2020 OT Individual Time: 0903-1000 OT Individual Time Calculation (min): 57 min    Short Term Goals: Week 2:  OT Short Term Goal 1 (Week 2): STGs=LTGs due to ELOS  Skilled Therapeutic Interventions/Progress Updates:  Patient met seated in recliner in agreement with OT treatment session. 0/10 pain reported at rest and with activity. Patient completed walk-in shower transfer and bathing at shower level with Min guard A and use of RW (for mobility/transfer). UB dressing with supervision A and LB dressing with CGA. Min A to don footwear. Patient requires cues for technique and use of RUE, sequencing, safety awareness and thoroughness. 1/3 grooming tasks standing at sink level with Min CGA progressing to supervision A. Patient initially using non-affected and non dominant LUE to rinse out mouth but encouraged to use RUE instead for NMR. Education on use of RUE as dominant UE. Patient expressed understanding. Session concluded with patient seated in recliner with call bell within reach, chair alarm activated and all needs met.   Therapy Documentation Precautions:  Precautions Precautions: Fall Precaution Comments: mild R hemi, posterior bias Restrictions Weight Bearing Restrictions: No General:    Therapy/Group: Individual Therapy  [name] 12/18/2020, 7:03 AM 

## 2020-12-18 NOTE — Progress Notes (Signed)
Physical Therapy Session Note  Patient Details  Name: Alec Sanchez MRN: [id number] Date of Birth: Jul 08, 1955  Today's Date: 12/18/2020 PT Missed Time: 60 Minutes Missed Time Reason: Patient unwilling to participate  Skilled Therapeutic Interventions/Progress Updates:   Received pt supine in bed asleep. Upon wakening, pt declined participating in therapy stating "not today and not right now". Therapist encouraged bed level exercises, however pt continued to refuse and requested to rest as he "did a lot earlier". Will attempt to make up time as able. 60 minutes missed of skilled physical therapy due to refusal to participate.   Therapy Documentation Precautions:  Precautions Precautions: Fall Precaution Comments: mild R hemi, posterior bias Restrictions Weight Bearing Restrictions: No  Martin Majestic PT, DPT   12/18/2020, 9:22 AM

## 2020-12-18 NOTE — Progress Notes (Signed)
Physical Therapy Session Note  Patient Details  Name: Alec Sanchez MRN: [id number] Date of Birth: [birth year]  Today's Date: 12/18/2020 PT Individual Time: 1300-1340 PT Individual Time Calculation (min): 40 min   Short Term Goals: Week 2:  PT Short Term Goal 1 (Week 2): STG = LTG due to ELOS  Skilled Therapeutic Interventions/Progress Updates:     Pt seated in recliner at start of session - agreeable to PT tx. Does not report of pain during treatment. Continues to be slow to move with some delayed processing noted. Informed him of upcoming therapy schedule and encouraged him to try his best to complete his day of therapies. Also spent time reviewing DC planning, DC logistics, home safety, DME rec's, etc. Pt completes sit<>stand to RW with supervision from recliner (recliner not locked during transfer, resulting in unsteadiness). He ambulated from his room to 35M elevator with supervision and RW - gait speed <0.52m/s with short step length bilaterally. No formal LOB or knee buckling present throughout gait today. He had a standing rest break on the elevator and then ambulated to ADL apartment on 35M rehab floor with supervision and RW. Practiced furniture transfers from low sitting sofa couch as well as bed mobility on regular flat bed. He completed furniture transfers mod I with RW and bed mobility mod I as well. Next, worked on household gait in ADL apartment with RW and ambulating in the kitchen with RW. Instructed to open both high and low cabinets to reach for various objects which he did at supervision level. He required occasional cues for safety awareness with RW management while reaching for objects and stepping closer to the cabinets prior to reaching to avoid LOB. Practiced opening and closing the fridge as well as reaching for objects in the refrigerator too. All completed at supervision level. He ambulated back upstairs to his room with supervision and RW - instructed to practice pressing  elevator buttons (he will use elevator at his apartment complex) with RW and supervision. Again, requiring cues to get closer to target prior to reaching. Pt requesting to lay down in bed due to fatigue - encouraged to stay up in chair for next therapy session. Pt completed bed mobility at mod I level and remained semi-reclined in bed with all needs in reach, bed alarm on.   Therapy Documentation Precautions:  Precautions Precautions: Fall Precaution Comments: mild R hemi, posterior bias Restrictions Weight Bearing Restrictions: No General:    Therapy/Group: Individual Therapy  [name] 12/18/2020, 7:51 AM

## 2020-12-18 NOTE — Plan of Care (Signed)
  Problem: Consults Goal: RH STROKE PATIENT EDUCATION Description: See Patient Education module for education specifics  Outcome: Progressing   Problem: RH BOWEL ELIMINATION Goal: RH STG MANAGE BOWEL WITH ASSISTANCE Description: STG Manage Bowel with mod I Assistance. Outcome: Progressing Goal: RH STG MANAGE BOWEL W/MEDICATION W/ASSISTANCE Description: STG Manage Bowel with Medication with Assistance. Outcome: Progressing   Problem: RH BLADDER ELIMINATION Goal: RH STG MANAGE BLADDER WITH ASSISTANCE Description: STG Manage Bladder With no Assistance Outcome: Progressing   Problem: RH SAFETY Goal: RH STG ADHERE TO SAFETY PRECAUTIONS W/ASSISTANCE/DEVICE Description: STG Adhere to Safety Precautions With cues Assistance/Device. Outcome: Progressing   Problem: RH PAIN MANAGEMENT Goal: RH STG PAIN MANAGED AT OR BELOW PT'S PAIN GOAL Description: At or below level 4 Outcome: Progressing   Problem: RH KNOWLEDGE DEFICIT Goal: RH STG INCREASE KNOWLEDGE OF HYPERTENSION Description: Patient will be able to manage HTN with medications and dietary modifications using handouts and educational resources independently Outcome: Progressing Goal: RH STG INCREASE KNOWLEGDE OF HYPERLIPIDEMIA Description: Patient will be able to manage HLD with medications and dietary modifications using handouts and educational resources independently Outcome: Progressing Goal: RH STG INCREASE KNOWLEDGE OF STROKE PROPHYLAXIS Description: Patient will be able to manage secondary stroke risks with medications and dietary modifications using handouts and educational resources independently Outcome: Progressing

## 2020-12-18 NOTE — Discharge Summary (Signed)
Physician Discharge Summary  Patient ID: Alec Sanchez MRN: [id number] DOB/AGE: 1955/10/23 65 y.o.  Admit date: 12/06/2020 Discharge date: 12/21/2020  Discharge Diagnoses:  Principal Problem:   Ischemic cerebrovascular accident (CVA) of frontal lobe (HCC) Active Problems:   Alcohol abuse   CKD (chronic kidney disease)   Transaminitis   Essential hypertension   Labile blood pressure   Slow transit constipation   GIB (gastrointestinal bleeding) Obesity History of tobacco use as well as marijuana Hyperlipidemia Transcarotid artery revascularization 04/12/2020 Renal colic Medical noncompliance  Discharged Condition: Stable  Significant Diagnostic Studies: CT ANGIO HEAD NECK W WO CM  Result Date: 12/01/2020 CLINICAL DATA:  Neuro deficit, acute, stroke suspected. Slurred speech. EXAM: CT ANGIOGRAPHY HEAD AND NECK TECHNIQUE: Multidetector CT imaging of the head and neck was performed using the standard protocol during bolus administration of intravenous contrast. Multiplanar CT image reconstructions and MIPs were obtained to evaluate the vascular anatomy. Carotid stenosis measurements (when applicable) are obtained utilizing NASCET criteria, using the distal internal carotid diameter as the denominator. CONTRAST:  OMNIPAQUE IOHEXOL 350 MG/ML SOLN COMPARISON:  Head and neck CTA 01/31/2020 FINDINGS: CTA NECK FINDINGS Aortic arch: Standard 3 vessel aortic arch with mixed calcified and soft plaque and with detailed evaluation of the aorta performed on separate chest CTA. Patent brachiocephalic and subclavian arteries without significant stenosis. Right carotid system: Patent without evidence of a significant stenosis or dissection. Interval carotid stenting extending from the distal common into the proximal internal carotid arteries. Left carotid system: Patent with soft plaque in the mid common carotid artery not resulting in significant stenosis. Mixed calcified and soft, mildly ulcerated  plaque in the left carotid bulb results in approximately 60% stenosis, unchanged. Vertebral arteries: The left vertebral artery is patent and dominant with predominantly soft plaque in the proximal V1 segment resulting in progressive, severe stenosis (series 10, image 106). There is unchanged occlusion of the right vertebral artery at its origin with reconstitution of small distal V2 and V3 segments. A severe stenosis is again noted of the distal right V3 segment. Skeleton: Focally advanced cervical disc degeneration at C5-6. Moderate left facet arthrosis at C4-5. Other neck: No evidence of cervical lymphadenopathy or mass. Upper chest: Reported separately. Review of the MIP images confirms the above findings CTA HEAD FINDINGS Anterior circulation: The internal carotid arteries are patent from skull base to carotid termini with similar appearance of mild left cavernous and mild to moderate bilateral paraclinoid stenoses. ACAs and MCAs are patent without evidence of a proximal branch occlusion or significant proximal stenosis. There is an unchanged mild left M1 stenosis. Mild-to-moderate branch vessel irregularity is noted bilaterally. No aneurysm is identified. Posterior circulation: The intracranial vertebral arteries are patent to the basilar. The left V4 segment is mildly irregular without significant stenosis. The right V4 segment is small with superimposed moderate to severe stenoses. Patent PICA, AICA, and SCA origins are identified bilaterally. The basilar artery is widely patent. Posterior communicating arteries are diminutive or absent. The PCAs are patent with moderate branch vessel irregularity but no flow limiting proximal stenosis. No aneurysm is identified. Venous sinuses: Not well evaluated due to arterial contrast timing. Anatomic variants: Dominant left vertebral artery. Review of the MIP images confirms the above findings IMPRESSION: 1. No emergent large vessel occlusion. 2. Interval right carotid  stenting without residual stenosis. 3. Unchanged 60% proximal left ICA stenosis. 4. 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 5. Progressive, severe proximal left vertebral artery stenosis. 6.  Unchanged mild to moderate bilateral intracranial ICA stenoses and mild left M1 stenosis. 7. Aortic Atherosclerosis (ICD10-I70.0). Electronically Signed   By: Sebastian Ache M.D.   On: 12/01/2020 16:32   CT HEAD WO CONTRAST  Result Date: 12/01/2020 CLINICAL DATA:  Neuro deficit, generalized weakness EXAM: CT HEAD WITHOUT CONTRAST TECHNIQUE: Contiguous axial images were obtained from the base of the skull through the vertex without intravenous contrast. COMPARISON:  03/10/2020 FINDINGS: Brain: No evidence of acute infarction, hemorrhage, hydrocephalus, extra-axial collection or mass lesion/mass effect. Chronic atrophic and ischemic changes are noted stable in appearance from the prior exam. Vascular: No hyperdense vessel or unexpected calcification. Skull: Normal. Negative for fracture or focal lesion. Sinuses/Orbits: No acute finding. Other: None. IMPRESSION: Chronic atrophic and ischemic changes stable from the prior exam. Electronically Signed   By: Alcide Clever M.D.   On: 12/01/2020 01:46   MR BRAIN WO CONTRAST  Result Date: 12/01/2020 CLINICAL DATA:  Follow-up examination for acute stroke. EXAM: MRI HEAD WITHOUT CONTRAST TECHNIQUE: Multiplanar, multiecho pulse sequences of the brain and surrounding structures were obtained without intravenous contrast. COMPARISON:  Prior CTA from earlier the same day. FINDINGS: Brain: Diffuse prominence of the CSF containing spaces compatible with generalized cerebral atrophy. Patchy and confluent T2/FLAIR hyperintensity involving the periventricular and deep white matter both cerebral hemispheres most consistent with chronic small vessel ischemic disease, moderate to advanced in nature. Remote lacunar infarcts present at the left  greater than right basal ganglia and thalami. Appearance is progressed from prior. Three distinct foci of restricted diffusion all measuring approximately 8 mm each seen involving the right anterior genu of the corpus callosum (series 5, image 87), periventricular white matter of the right posterior corona radiata (series 5, image 84), and right lentiform nucleus (series 5, image 78). No associated hemorrhage or mass effect. No other diffusion abnormality to suggest acute or subacute ischemia. Additionally, there is patchy T2/FLAIR signal abnormality involving primarily the subcortical aspect of the posterior right frontoparietal region (series 11, image 20), with extension to involve the parasagittal right frontal region near the vertex (series 11, image 22). No associated diffusion abnormality or significant mass effect. Finding is nonspecific, but new as compared to prior MRI, and could reflect changes subacute ischemia, somewhat watershed in distribution, possibly related to previously identified severe right ICA stenosis. Possible PRES could also conceivably have this appearance. No evidence for acute intracranial hemorrhage. Multiple scattered punctate chronic micro hemorrhages noted, primarily clustered about the deep gray nuclei, likely related to chronic poorly controlled hypertension. No mass lesion, midline shift or mass effect. Mild ventricular prominence related global parenchymal volume loss of hydrocephalus. No extra-axial fluid collection. Pituitary gland suprasellar region normal. Midline structures intact. Vascular: Major intracranial vascular flow voids are maintained. Skull and upper cervical spine: Craniocervical junction within normal limits. Bone marrow signal intensity within normal limits. No scalp soft tissue abnormality. Sinuses/Orbits: Globes orbital soft tissues within normal limits. Paranasal sinuses are largely clear. No significant mastoid effusion. Inner ear structures grossly  normal. Other: None. IMPRESSION: 1. Three distinct 8 mm acute ischemic infarcts involving the right anterior genu of the corpus callosum, periventricular white matter of the right posterior corona radiata, and right lentiform nucleus as above. No associated hemorrhage or mass effect. 2. Scattered T2/FLAIR signal abnormality involving the subcortical aspects of the right frontal and parietal regions as above, new as compared to prior MRI from 01/31/2020. Finding is nonspecific, but favored to reflect changes of subacute ischemia, possibly related to previously identified severe right  ICA stenosis given the somewhat watershed distribution. Possible PRES could also conceivably have this appearance, and could be considered in the correct clinical setting. 3. Underlying age-related cerebral atrophy with advanced chronic microvascular ischemic disease, progressed from prior. Electronically Signed   By: Rise Mu M.D.   On: 12/01/2020 22:44   ECHOCARDIOGRAM COMPLETE  Result Date: 12/03/2020    ECHOCARDIOGRAM REPORT   Patient Name:   Alec Sanchez Date of Exam: 12/03/2020 Medical Rec #:  [id number]    Height:       67.0 in Accession #:    [id number]   Weight:       180.1 lb Date of Birth:  05/10/1955    BSA:          1.934 m Patient Age:    65 years     BP:           194/93 mmHg Patient Gender: M            HR:           79 bpm. Exam Location:  Inpatient Procedure: 2D Echo, Cardiac Doppler and Color Doppler Indications:    Stroke I63.9  History:        Patient has prior history of Echocardiogram examinations, most                 recent 02/01/2020. Stroke; Risk Factors:Dyslipidemia and                 Hypertension.  Sonographer:    Eulah Pont RDCS Referring Phys: [id number] ERIK C HOFFMAN IMPRESSIONS  1. Left ventricular ejection fraction, by estimation, is 60 to 65%. The left ventricle has normal function. The left ventricle has no regional wall motion abnormalities. There is moderate left ventricular hypertrophy.  Left ventricular diastolic parameters are consistent with Grade I diastolic dysfunction (impaired relaxation).  2. Right ventricular systolic function is normal. The right ventricular size is normal.  3. The mitral valve is normal in structure. Trivial mitral valve regurgitation. No evidence of mitral stenosis. Moderate mitral annular calcification.  4. The aortic valve is calcified. Aortic valve regurgitation is not visualized. Mild to moderate aortic valve sclerosis/calcification is present, without any evidence of aortic stenosis.  5. The inferior vena cava is normal in size with greater than 50% respiratory variability, suggesting right atrial pressure of 3 mmHg. Conclusion(s)/Recommendation(s): No intracardiac source of embolism detected on this transthoracic study. A transesophageal echocardiogram is recommended to exclude cardiac source of embolism if clinically indicated. FINDINGS  Left Ventricle: Left ventricular ejection fraction, by estimation, is 60 to 65%. The left ventricle has normal function. The left ventricle has no regional wall motion abnormalities. The left ventricular internal cavity size was normal in size. There is  moderate left ventricular hypertrophy. Left ventricular diastolic parameters are consistent with Grade I diastolic dysfunction (impaired relaxation). Right Ventricle: The right ventricular size is normal. No increase in right ventricular wall thickness. Right ventricular systolic function is normal. Left Atrium: Left atrial size was normal in size. Right Atrium: Right atrial size was normal in size. Pericardium: There is no evidence of pericardial effusion. Mitral Valve: The mitral valve is normal in structure. Moderate mitral annular calcification. Trivial mitral valve regurgitation. No evidence of mitral valve stenosis. Tricuspid Valve: The tricuspid valve is normal in structure. Tricuspid valve regurgitation is not demonstrated. No evidence of tricuspid stenosis. Aortic Valve:  The aortic valve is calcified. Aortic valve regurgitation is not visualized. Mild to moderate aortic valve sclerosis/calcification is present,  without any evidence of aortic stenosis. Pulmonic Valve: The pulmonic valve was normal in structure. Pulmonic valve regurgitation is not visualized. No evidence of pulmonic stenosis. Aorta: The aortic root is normal in size and structure. Venous: The inferior vena cava is normal in size with greater than 50% respiratory variability, suggesting right atrial pressure of 3 mmHg. IAS/Shunts: No atrial level shunt detected by color flow Doppler.  LEFT VENTRICLE PLAX 2D LVIDd:         3.40 cm   Diastology LVIDs:         2.40 cm   LV e' medial:    3.97 cm/s LV PW:         1.30 cm   LV E/e' medial:  13.0 LV IVS:        1.40 cm   LV e' lateral:   4.78 cm/s LVOT diam:     2.00 cm   LV E/e' lateral: 10.8 LV SV:         56 LV SV Index:   29 LVOT Area:     3.14 cm  RIGHT VENTRICLE RV S prime:     19.90 cm/s TAPSE (M-mode): 1.7 cm LEFT ATRIUM             Index        RIGHT ATRIUM          Index LA diam:        3.20 cm 1.65 cm/m   RA Area:     8.33 cm LA Vol (A2C):   30.3 ml 15.67 ml/m  RA Volume:   14.10 ml 7.29 ml/m LA Vol (A4C):   25.3 ml 13.08 ml/m LA Biplane Vol: 27.9 ml 14.42 ml/m  AORTIC VALVE LVOT Vmax:   134.00 cm/s LVOT Vmean:  79.000 cm/s LVOT VTI:    0.179 m  AORTA Ao Asc diam: 3.40 cm MITRAL VALVE MV Area (PHT): 2.24 cm    SHUNTS MV Decel Time: 338 msec    Systemic VTI:  0.18 m MV E velocity: 51.80 cm/s  Systemic Diam: 2.00 cm MV A velocity: 74.10 cm/s MV E/A ratio:  0.70 Donato Schultz MD Electronically signed by Donato Schultz MD Signature Date/Time: 12/03/2020/3:17:59 PM    Final    CT Angio Chest/Abd/Pel for Dissection W and/or Wo Contrast  Result Date: 12/01/2020 CLINICAL DATA:  Right flank pain, abdominal pain, slurred speech EXAM: CT ANGIOGRAPHY CHEST, ABDOMEN AND PELVIS TECHNIQUE: Non-contrast CT of the chest was initially obtained. Multidetector CT imaging through  the chest, abdomen and pelvis was performed using the standard protocol during bolus administration of intravenous contrast. Multiplanar reconstructed images and MIPs were obtained and reviewed to evaluate the vascular anatomy. CONTRAST:  OMNIPAQUE IOHEXOL 350 MG/ML SOLN COMPARISON:  None. FINDINGS: CTA CHEST FINDINGS Cardiovascular: The heart and great vessels are grossly unremarkable without pericardial effusion. Mild atherosclerosis of the coronary vasculature. There is no evidence of thoracic aortic aneurysm or dissection. Moderate atherosclerosis of the aortic arch is noted, with mild narrowing at the origin of the innominate and left common carotid arteries. No filling defects or pulmonary emboli. Mediastinum/Nodes: No enlarged mediastinal, hilar, or axillary lymph nodes. Thyroid gland, trachea, and esophagus demonstrate no significant findings. Lungs/Pleura: No acute airspace disease, effusion, or pneumothorax. The central airways are patent. Musculoskeletal: No acute or destructive bony lesions. Cortical and trabecular thickening of the left first rib may reflect underlying Paget disease. Reconstructed images demonstrate no additional findings. Review of the MIP images confirms the above findings. CTA ABDOMEN  AND PELVIS FINDINGS VASCULAR Aorta: Normal caliber aorta without aneurysm, dissection, or significant stenosis. There is moderate multifocal atherosclerosis. At the level of the IMA, there is mild fat stranding along the ventral aspect of the aorta extending to the root of the IMA. This could reflect acute IMA thrombosis or underlying vasculitis. Celiac: Patent without evidence of aneurysm, dissection, vasculitis or significant stenosis. SMA: Patent without evidence of aneurysm, dissection, vasculitis or significant stenosis. Mild atherosclerosis. Renals: There are single bilateral renal arteries. On the left, there is mild to moderate atherosclerosis with approximately 50% stenosis in the distal  left renal artery at the hilum. On the right, there is diffuse atheromatous plaque extending from the origin for approximately 2.5 cm in length, resulting in multifocal high-grade stenoses estimated 50-70%. No evidence of dissection aneurysm, or vasculitis. IMA: The IMA is occluded at its origin, with perivascular fat stranding suggesting possible acute IMA occlusion or vasculitis. No evidence of aneurysm or dissection. Distal branches of the IMA enhance normally via collateral flow. Inflow: Patent without evidence of aneurysm, dissection, vasculitis or significant stenosis. Mild to moderate atherosclerosis within the right common iliac artery without high-grade stenosis. Veins: No obvious venous abnormality within the limitations of this arterial phase study. Review of the MIP images confirms the above findings. NON-VASCULAR Hepatobiliary: No focal liver abnormality is seen. No gallstones, gallbladder wall thickening, or biliary dilatation. Pancreas: Unremarkable. No pancreatic ductal dilatation or surrounding inflammatory changes. Spleen: Normal in size without focal abnormality. Adrenals/Urinary Tract: Mild right renal cortical atrophy may reflect sequela of underlying atherosclerosis in the right renal artery described above. Left kidney is unremarkable. No evidence of nephrolithiasis or obstructive uropathy within either kidney. The adrenals and bladder are grossly unremarkable. Minimal excreted contrast within the left ureter and bladder. Stomach/Bowel: No bowel obstruction or ileus. Normal appendix right lower quadrant. No bowel wall thickening or inflammatory change. Specifically, no evidence of distal colonic wall thickening to suggest underlying ischemia. Lymphatic: No pathologic adenopathy within the abdomen or pelvis. Reproductive: Prostate is unremarkable. Other: No free fluid or free gas.  No abdominal wall hernia. Musculoskeletal: No acute or destructive bony lesions. Reconstructed images demonstrate  no additional findings. Review of the MIP images confirms the above findings. IMPRESSION: 1. 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 Findings could reflect acute IMA occlusion or underlying vasculitis. The distal branches of the IMA enhance via collateral flow, with no evidence of bowel ischemia within the distal colon. 2. No evidence of thoracoabdominal aortic aneurysm or dissection. 3. Multifocal atherosclerosis with significant stenoses at the origin of the innominate artery, left common carotid artery, and right renal artery as above. 4. No evidence of pulmonary embolus. 5. Otherwise no acute intrathoracic, intra-abdominal, or intrapelvic process. 6. 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 7.  Aortic Atherosclerosis (ICD10-I70.0). Critical Value/emergent results were called by telephone at the time of interpretation on 12/01/2020 at 4:41 pm to provider [facility], who verbally acknowledged these results. Electronically Signed   By: Sharlet Salina M.D.   On: 12/01/2020 16:45    Labs:  Basic Metabolic Panel: Recent Labs  Lab 12/15/20 0716 12/18/20 0735  NA 134* 133*  K 3.4* 3.6  CL 96* 95*  CO2 29 28  GLUCOSE 96 110*  BUN 22 19  CREATININE 1.38* 1.34*  CALCIUM 9.6 9.3    CBC: No results for input(s): WBC, NEUTROABS, HGB, HCT, MCV, PLT in  the last 168 hours.   CBG: No results for input(s): GLUCAP in the last 168 hours.  Family history.  Positive for hypertension as well as hyperlipidemia.  Denies any colon cancer esophageal cancer or rectal cancer  Brief HPI:   Alec Sanchez is a 65 y.o. right-handed male with history of hypertension tobacco use hyperlipidemia prior CVA with residual right side weakness maintained on aspirin and Plavix with poor medical compliance, transcarotid artery revascularization 04/12/2020 as well as recent evaluation in urgent care for modest  hematuria diagnosed with renal colic.  Per chart review lives alone.  Daughter and son-in-law nearby.  Independent with assistive device.  Presented 12/01/2020 with dysarthria, facial droop and altered mental status.  Cranial CT scan unremarkable for acute intracranial process.  CT angiogram head and neck no emergent large vessel occlusion.  Unchanged 60% proximal left ICA stenosis.  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  MRI of the brain showed 3 distinct 8 mm acute ischemic infarct involving the right anterior genu of the corpus callosum periventricular white matter of the right posterior corona radiata and right lentiform.  No associated hemorrhage or mass-effect.  Patient did not receive tPA.  CT angiogram chest abdomen pelvis showed occlusion of the proximal IMA at its origin with mild perivascular fat stranding along the ventral aspect of the distal aorta and IMA origin.  Echocardiogram ejection fraction of 60 to 65% no wall motion abnormalities grade 1 diastolic dysfunction.  Admission chemistries unremarkable except potassium 3.4 creatinine 1.63 urine drug screen positive marijuana troponin 47 D-dimer 1.28.  Maintained on aspirin 81 mg daily and Plavix 75 mg daily for CVA prophylaxis x3 weeks and aspirin alone.  Subcutaneous heparin for DVT prophylaxis.  Therapy evaluations completed due to patient decreased functional mobility generalized weakness was admitted for a comprehensive rehab program.   Hospital Course: Alec Sanchez was admitted to rehab 12/06/2020 for inpatient therapies to consist of PT, ST and OT at least three hours five days a week. Past admission physiatrist, therapy team and rehab RN have worked together to provide customized collaborative inpatient rehab.  Pertaining to patient's right facial droop dysarthria findings of 3 small acute ischemic infarcts involving the right anterior genu of the corpus callosum periventricular white matter of  the right posterior corona radiata and right lentiform due to small vessel disease.  Left basal ganglia infarct with residual right side weakness.  Initially on aspirin and Plavix for CVA prophylaxis.  Aspirin Plavix initially held due to some rectal bleeding with gastroenterology services consulted most likely due to constipation.  Patient had declined any further intervention or scope and remained on Protonix.  Hemoglobin remained stable.  His Plavix was resumed aspirin currently on hold.  Blood pressure monitored on Norvasc's as well as hydralazine as well as clonidine patch.  Lipitor ongoing for hyperlipidemia.  Pain managed with use of Robaxin as well as Lidoderm patch.  Urine drug screen positive marijuana patient received counsel guards to cessation of illicit drug products.  Mild AKI creatinine improved 1.34.  Mood stabilization with the use of Paxil and emotional support provided.   Blood pressures were monitored on TID basis and controlled    Rehab course: During patient's stay in rehab weekly team conferences were held to monitor patient's progress, set goals and discuss barriers to discharge. At admission, patient required minimal assist sit to supine moderate assist ambulate 50 feet rolling walker  Physical exam.  Blood pressure 158/89 pulse 72 temperature 97.9 respirations 18  oxygen saturation 95% room air Constitutional.  No acute distress HEENT Head.  Normocephalic and atraumatic Eyes.  Pupils round and reactive to light no discharge without nystagmus Neck.  Supple nontender no JVD without thyromegaly Cardiac regular rate rhythm no extra sounds or murmur heard Abdomen.  Soft nontender positive bowel sounds without rebound Respiratory effort normal no respiratory distress without wheeze Musculoskeletal.  Normal range of motion no edema or tenderness extremities Skin.  Warm and dry Neurologic.  Alert oriented makes eye contact with examiner.  Speech mildly dysarthric but  intelligible.  Provides name and age but somewhat slow to process.  Follows simple commands. Motor.  4 -/5 grossly throughout  He/She  has had improvement in activity tolerance, balance, postural control as well as ability to compensate for deficits. He/She has had improvement in functional use RUE/LUE  and RLE/LLE as well as improvement in awareness.  Completed sit to stand rolling walker supervision.  Ambulates from his room to the for [facility] elevator with supervision rolling walker.  Somewhat slow to process.  Needing encouragement sometimes to participate.  Completed furniture transfers modified independent with rolling walker bed mobility modified independent.  Patient completed walk-in shower transfer and bathing and shower level minimal guard and use of rolling walker.  Required some cues for technique and use of right upper extremity.  Full family teaching completed plan discharged to home       Disposition: Discharge to home    Diet: Regular consistency  Special Instructions: No driving smoking or alcohol   Follow-up with neurology services in regards to currently being maintained on Plavix as aspirin had been discontinued due to GI bleed and consider resuming aspirin   Medications at discharge 1.  Tylenol as needed 2.  Norvasc 10 mg p.o. daily 3.  Lipitor 80 mg p.o. nightly 4.  Clonidine  0.1 mg BID 5.  Plavix 25 mg p.o. daily 6.  Colace 100 mg p.o. daily 7.  Hydralazine 10 mg every 8 hours 8.  Lidoderm patch change as directed 9.  Robaxin 500 mg every 8 hours as needed muscle spasms 10.  Protonix 40 mg p.o. daily 11.  Paxil 20 mg p.o. daily 12.  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   Specialty: Physical Medicine and Rehabilitation Why: 01/30/21 please arrive at 1:00pm for 1:20pm appointment, thank you! Contact information: [address] [place] [address] [phone]         [facility] Follow up.   Contact information: [address] [facility] [place] [address] [phone]         [facility] Follow up.   Contact information: [address] [place] [address] [phone]         Lynann Bologna, MD Follow up.   Specialties: Gastroenterology, Internal Medicine Why: Call for appointment Contact information: [address] [facility] [address] [place] [address] [phone]  Signed: Mcarthur Rossetti Caleb Prigmore 12/21/2020, 4:56 AM

## 2020-12-19 ENCOUNTER — Other Ambulatory Visit (HOSPITAL_COMMUNITY): Payer: Self-pay

## 2020-12-19 MED ORDER — CHOLECALCIFEROL 25 MCG (1000 UT) PO TABS
1000.0000 [IU] | ORAL_TABLET | Freq: Every morning | ORAL | 0 refills | Status: AC
  Filled 2020-12-19: qty 30, 30d supply, fill #0

## 2020-12-19 MED ORDER — AMLODIPINE BESYLATE 10 MG PO TABS
10.0000 mg | ORAL_TABLET | Freq: Every morning | ORAL | 0 refills | Status: AC
  Filled 2020-12-19: qty 14, 14d supply, fill #0

## 2020-12-19 MED ORDER — POLYETHYLENE GLYCOL 3350 17 G PO PACK: 17.0000 g | PACK | Freq: Two times a day (BID) | ORAL | 0 refills | Status: DC

## 2020-12-19 MED ORDER — ATORVASTATIN CALCIUM 80 MG PO TABS
80.0000 mg | ORAL_TABLET | Freq: Every day | ORAL | 0 refills | Status: DC
  Filled 2020-12-19: qty 14, 14d supply, fill #0

## 2020-12-19 MED ORDER — ACETAMINOPHEN 325 MG PO TABS: 650.0000 mg | ORAL_TABLET | Freq: Four times a day (QID) | ORAL | Status: AC | PRN

## 2020-12-19 MED ORDER — PAROXETINE HCL 20 MG PO TABS
20.0000 mg | ORAL_TABLET | Freq: Every day | ORAL | 0 refills | Status: DC
  Filled 2020-12-19: qty 14, 14d supply, fill #0

## 2020-12-19 MED ORDER — METHOCARBAMOL 500 MG PO TABS
500.0000 mg | ORAL_TABLET | Freq: Three times a day (TID) | ORAL | 0 refills | Status: AC | PRN
  Filled 2020-12-19: qty 42, 14d supply, fill #0

## 2020-12-19 MED ORDER — PANTOPRAZOLE SODIUM 40 MG PO TBEC
40.0000 mg | DELAYED_RELEASE_TABLET | Freq: Every day | ORAL | 0 refills | Status: AC
  Filled 2020-12-19: qty 14, 14d supply, fill #0

## 2020-12-19 MED ORDER — HYDRALAZINE HCL 10 MG PO TABS
10.0000 mg | ORAL_TABLET | Freq: Three times a day (TID) | ORAL | 0 refills | Status: DC
  Filled 2020-12-19: qty 90, 30d supply, fill #0

## 2020-12-19 MED ORDER — LIDOCAINE 5 % EX PTCH
1.0000 | MEDICATED_PATCH | CUTANEOUS | 0 refills | Status: AC
  Filled 2020-12-19: qty 30, 30d supply, fill #0

## 2020-12-19 MED ORDER — CLONIDINE HCL 0.1 MG PO TABS
0.1000 mg | ORAL_TABLET | Freq: Two times a day (BID) | ORAL | Status: DC
  Administered 2020-12-19 – 2020-12-21 (×4): 0.1 mg via ORAL
  Filled 2020-12-19 (×4): qty 1

## 2020-12-19 MED ORDER — DOCUSATE SODIUM 100 MG PO CAPS: 100.0000 mg | ORAL_CAPSULE | Freq: Every day | ORAL | 0 refills | Status: AC

## 2020-12-19 MED ORDER — CLONIDINE 0.1 MG/24HR TD PTWK
0.1000 mg | MEDICATED_PATCH | TRANSDERMAL | 0 refills | Status: DC
  Filled 2020-12-19: qty 4, 28d supply, fill #0

## 2020-12-19 MED ORDER — FERROUS SULFATE 325 (65 FE) MG PO TABS
325.0000 mg | ORAL_TABLET | ORAL | 0 refills | Status: AC
  Filled 2020-12-19: qty 15, 35d supply, fill #0

## 2020-12-19 NOTE — Progress Notes (Signed)
Occupational Therapy Session Note  Patient Details  Name: Alec Sanchez MRN: [id number] Date of Birth: 29-Dec-1955  Today's Date: 12/19/2020 OT Individual Time: 8719-9412 OT Individual Time Calculation (min): 70 min    Short Term Goals: Week 1:  OT Short Term Goal 1 (Week 1): Pt will perform BSC/toilet transfer CGA with LRAD OT Short Term Goal 1 - Progress (Week 1): Met OT Short Term Goal 2 (Week 1): Pt will perform LB dress with hemitechnique CGA OT Short Term Goal 2 - Progress (Week 1): Met OT Short Term Goal 3 (Week 1): Pt will perform bathing tasks at shower level CGA with AE PRN OT Short Term Goal 3 - Progress (Week 1): Met Week 2:  OT Short Term Goal 1 (Week 2): STGs=LTGs due to ELOS  Skilled Therapeutic Interventions/Progress Updates:    Patient in bed, alert and ready for therapy session.  He denies pain and states that he would like to work on right arm coordination.  Supine to sitting with CS.  Sit to stand and ambulation with RW to/from bed, w/c, mat table CS/CGA.   Completed visual motor, arm coordination/reach and standing balance activities with BITS system:   Trial 1:  dots, 2 minutes, left hand = 1.93 sec,     Trial 2:  dots, 2 minutes, right hand = 2.55 sec   Trial 3:  saccades, #1-40, both hands = 5.25 sec, total time 3:28  Completed shoulder stretch, weight bearing, trunk mobility, facilitation of scapula, ER and stability activities seated and in stance - improved mobility noted, no loss of balance with transitional movements.  He remained seated in w/c at close of session, seat alarm set and call bell in hand.    Therapy Documentation Precautions:  Precautions Precautions: Fall Precaution Comments: mild R hemi, posterior bias Restrictions Weight Bearing Restrictions: No  Therapy/Group: Individual Therapy  Carlos Levering 12/19/2020, 7:38 AM

## 2020-12-19 NOTE — Progress Notes (Signed)
PROGRESS NOTE   Subjective/Complaints: No new complaints this morning Cr improving Other labs stable  ROS: Denies CP, SOB, +nausea reported as per nursing notes, no more bloody stools  Objective:   No results found. No results for input(s): WBC, HGB, HCT, PLT in the last 72 hours.   Recent Labs    12/18/20 0735  NA 133*  K 3.6  CL 95*  CO2 28  GLUCOSE 110*  BUN 19  CREATININE 1.34*  CALCIUM 9.3    Intake/Output Summary (Last 24 hours) at 12/19/2020 1022 Last data filed at 12/19/2020 0902 Gross per 24 hour  Intake 800 ml  Output 550 ml  Net 250 ml        Physical Exam: Vital Signs Blood pressure (!) 149/80, pulse 70, temperature 98.3 F (36.8 C), temperature source Oral, resp. rate 16, height 5\' 6"  (1.676 m), weight 77.8 kg, SpO2 100 %. Gen: no distress, normal appearing HEENT: oral mucosa pink and moist, NCAT Cardio: Reg rate Chest: normal effort, normal rate of breathing Abd: soft, non-distended Ext: no edema Psych: pleasant, normal affect Skin: intact  Neuro:Sensation intact to LT  Makes eye contact with examiner.   Speech is dysarthric but intelligible, stable.   Follows simple commands. Motor: 4-/5 RUE and RLE 5/5 on left side  unchanged  Assessment/Plan: 1. Functional deficits which require 3+ hours per day of interdisciplinary therapy in a comprehensive inpatient rehab setting. Physiatrist is providing close team supervision and 24 hour management of active medical problems listed below. Physiatrist and rehab team continue to assess barriers to discharge/monitor patient progress toward functional and medical goals  Care Tool:  Bathing    Body parts bathed by patient: Right arm, Left arm, Chest, Abdomen, Front perineal area, Buttocks, Right upper leg, Left upper leg, Face, Right lower leg, Left lower leg   Body parts bathed by helper: Buttocks, Right lower leg, Left lower leg      Bathing assist Assist Level: Contact Guard/Touching assist     Upper Body Dressing/Undressing Upper body dressing   What is the patient wearing?: Pull over shirt    Upper body assist Assist Level: Supervision/Verbal cueing    Lower Body Dressing/Undressing Lower body dressing      What is the patient wearing?: Pants, Underwear/pull up     Lower body assist Assist for lower body dressing: Contact Guard/Touching assist     Toileting Toileting    Toileting assist Assist for toileting: Maximal Assistance - Patient 25 - 49%     Transfers Chair/bed transfer  Transfers assist     Chair/bed transfer assist level: Supervision/Verbal cueing     Locomotion Ambulation   Ambulation assist      Assist level: Supervision/Verbal cueing Assistive device: Walker-rolling Max distance: 15 ft   Walk 10 feet activity   Assist     Assist level: Supervision/Verbal cueing Assistive device: Walker-rolling   Walk 50 feet activity   Assist Walk 50 feet with 2 turns activity did not occur: Safety/medical concerns  Assist level: Supervision/Verbal cueing Assistive device: Walker-rolling    Walk 150 feet activity   Assist Walk 150 feet activity did not occur: Safety/medical concerns  Assist level: Supervision/Verbal  cueing Assistive device: Walker-rolling    Walk 10 feet on uneven surface  activity   Assist     Assist level: Minimal Assistance - Patient > 75% Assistive device: Walker-rolling   Wheelchair     Assist Is the patient using a wheelchair?: Yes Type of Wheelchair: Manual    Wheelchair assist level: Maximal Assistance - Patient 25 - 49% Max wheelchair distance: 40    Wheelchair 50 feet with 2 turns activity    Assist        Assist Level: Total Assistance - Patient < 25%   Wheelchair 150 feet activity     Assist      Assist Level: Total Assistance - Patient < 25%   Blood pressure (!) 149/80, pulse 70, temperature 98.3  F (36.8 C), temperature source Oral, resp. rate 16, height 5\' 6"  (1.676 m), weight 77.8 kg, SpO2 100 %.    Medical Problem List and Plan: 1.  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  Impaired mobility: may discontinued heparin given improved ambulation.             -antiplatelet therapy: Aspirin and plavix held due to GI bleed 3. Low back pain: Continue Lidoderm patch, Robaxin as needed muscle spasms. Kpad added.              Monitor with increased exertion 4. Depression: Continue Paxil 20 mg daily             -antipsychotic agents: N/A 5. Neuropsych: This patient is capable of making decisions on his own behalf. 6. Skin/Wound Care: Routine skin checks 7. Fluids/Electrolytes/Nutrition: Routine in and outs           K+ back to nl , creat mild elevation  8.  Hypertension.  Continue Norvasc 10 mg daily, Hygroton 25 mg daily.  Added clonidine patch for HTN/low back pain  Hydralazine 10 3 times daily started on 10/16  Maintain clonidine 0.1mg  for 1 week and adjust at that point if needed. Continue to monitor for effects of yesterday's change  Much better controlled Vitals:   12/18/20 2049 12/19/20 0505  BP: (!) 130/93 (!) 149/80  Pulse:  70  Resp:  16  Temp:  98.3 F (36.8 C)  SpO2:  100%    9.  Hyperlipidemia: Continue Lipitor 10.  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  Follow-up outpatient 11.  History of tobacco use as well as marijuana.  Urine drug screen positive marijuana.  Counseling 12.  History of hematuria/renal colic.  Continue to monitor 13.  Medical noncompliance.  Counseling 14. AKI:  Cr improved to 1.34,  improving  6- 8 glasses of water per day- placed nursing order 15. Overweight: BMI 26.65: provide dietary education 16.  Transaminitis  Downtrending, discussed with patient.  17.  Slow transit constipation: continue miralax BID. Added prune juice with meals and colace daily. 18. GI bleed: started on daily protonix. Plavix resumed as per GI. Discussed with GI- most likely due to constipation. Resolved. Hgb normal 10/19 recheck in am  19. Hypokalemia resolved : 11/19 daily supplement started  LOS: 13 days A FACE TO FACE EVALUATION WAS PERFORMED  Contrina Orona P Ashawnti Tangen 12/19/2020, 10:22 AM

## 2020-12-19 NOTE — Progress Notes (Signed)
Occupational Therapy Session Note  Patient Details  Name: Alec Sanchez MRN: [id number] Date of Birth: [birth year]  Today's Date: 12/19/2020 OT Individual Time: 1300-1345 OT Individual Time Calculation (min): 45 min    Short Term Goals: Week 2:  OT Short Term Goal 1 (Week 2): STGs=LTGs due to ELOS  Skilled Therapeutic Interventions/Progress Updates:    Pt semi reclined in bed, requesting to shower.  No c/o pain.  Pt completed supine to sit, sit to stand at RW, ambulation of 10 feet to bathroom with mod I.  Pt required close supervision to complete tub bench transfer over wide threshold (pt reports he has a true walk in shower with no threshold).  Pt doffed clothing with distant supervision including shirt overhead, brief, pants, and socks with only one cue to remind pt to sit while taking pants off over feet.  Pt bathed UB and LB with distant supervision.  After drying off pt transferred out of shower with close supervision primarily due to threshold.  Ambulated to suitcase and retrieved clothing with supervision needing Vcs to keep RW in safe position (pt abandoning walker).  Pt would benefit from walker bag to safely retrieve adl items.  Pt donned shirt, brief, pants, and gripper socks with distant supervision.  Returned to supine with mod I.  Call bell in reach, bed alarm on.  Pt exhibiting improved processing time, sequencing, and problem solving throughout session today needing decreased amount of cueing to complete basic self care.  Therapy Documentation Precautions:  Precautions Precautions: Fall Precaution Comments: mild R hemi, posterior bias Restrictions Weight Bearing Restrictions: No    Therapy/Group: Individual Therapy  Amie Critchley 12/19/2020, 2:48 PM

## 2020-12-19 NOTE — Progress Notes (Signed)
Physical Therapy Session Note  Patient Details  Name: Alec Sanchez MRN: [id number] Date of Birth: 12-Sep-1955  Today's Date: 12/19/2020 PT Individual Time: 0900-0954 PT Individual Time Calculation (min): 54 min   Short Term Goals: Week 2:  PT Short Term Goal 1 (Week 2): STG = LTG due to ELOS   Skilled Therapeutic Interventions/Progress Updates:     Pt greeted supine in bed to start - agreeable to PT tx without reports of pain. Supine<>sit completed at mod I level with HOB slightly elevated. Sit<>stand to RW mod I from EOB and then ambulates with supervision within his room with supervision and RW to his w/c. Transported downstairs to 51M rehab gym for energy conservation in w/c. Worked on gait training with vs without RW in hallways. Ambulated hallway distances (>183ft) with supervision and RW and then 2x183ft with no AD and CGA. Gait without AD has increased gait speed but increased unsteadiness as well. He also lacks adequate B knee flexion in swing and has a ridgid stepping pattern. Noted decreased R foot clearance as well, especially after ~151ft of gait. Educated on importance of continued RW use at DC in the home and pt voiced understanding. Pt assisted onto the Nustep where he completed 10 minutes at workload of 4, maintaining around a self selected 5-10 steps/minute cadence - instruction to reach a minimum of 20 steps/minute to improve speed. Ambulated back to main rehab gym, ~156ft, with supervision and RW  - min cues needed for keeping body within walker frame and continued focus on improving B knee flexion in swing during gait. Pt transported back upstairs to his room for energy conservation. Pt requesting to lay down in bed due to fatigue - encouraged him to stay sitting up in recliner until next therapy session but he reports recliner is too uncomfortable. Completes ambulatory transfer with supervision and bed mobility mod I. Bed alarm on, all needs within reach.    Therapy  Documentation Precautions:  Precautions Precautions: Fall Precaution Comments: mild R hemi, posterior bias Restrictions Weight Bearing Restrictions: No General:    Therapy/Group: Individual Therapy  [name] 12/19/2020, 7:37 AM

## 2020-12-20 ENCOUNTER — Other Ambulatory Visit (HOSPITAL_COMMUNITY): Payer: Self-pay

## 2020-12-20 MED ORDER — CLOPIDOGREL BISULFATE 75 MG PO TABS
75.0000 mg | ORAL_TABLET | Freq: Every day | ORAL | 0 refills | Status: AC
  Filled 2020-12-20: qty 14, 14d supply, fill #0

## 2020-12-20 MED ORDER — CLONIDINE HCL 0.1 MG PO TABS
0.1000 mg | ORAL_TABLET | Freq: Two times a day (BID) | ORAL | 0 refills | Status: DC
  Filled 2020-12-20: qty 28, 14d supply, fill #0

## 2020-12-20 NOTE — Progress Notes (Signed)
PROGRESS NOTE   Subjective/Complaints: No new complaints this morning Ready for d/c tomorrow Discussed that his IV can be removed today, encouraged continued oral hydration.   ROS: Denies CP, SOB, +nausea reported as per nursing notes, denies bloody stools  Objective:   No results found. No results for input(s): WBC, HGB, HCT, PLT in the last 72 hours.   Recent Labs    12/18/20 0735  NA 133*  K 3.6  CL 95*  CO2 28  GLUCOSE 110*  BUN 19  CREATININE 1.34*  CALCIUM 9.3    Intake/Output Summary (Last 24 hours) at 12/20/2020 1055 Last data filed at 12/20/2020 0700 Gross per 24 hour  Intake 940 ml  Output 800 ml  Net 140 ml        Physical Exam: Vital Signs Blood pressure (!) 148/81, pulse 70, temperature 98.1 F (36.7 C), temperature source Oral, resp. rate 18, height 5\' 6"  (1.676 m), weight 77.2 kg, SpO2 100 %. Gen: no distress, normal appearing HEENT: oral mucosa pink and moist, NCAT Cardio: Reg rate Chest: normal effort, normal rate of breathing Abd: soft, non-distended Ext: no edema Psych: pleasant, normal affect Skin: intact  Neuro:Sensation intact to LT  Makes eye contact with examiner.   Speech is dysarthric but intelligible, stable.   Follows simple commands. Motor: 4-/5 RUE and RLE 5/5 on left side  unchanged  Assessment/Plan: 1. Functional deficits which require 3+ hours per day of interdisciplinary therapy in a comprehensive inpatient rehab setting. Physiatrist is providing close team supervision and 24 hour management of active medical problems listed below. Physiatrist and rehab team continue to assess barriers to discharge/monitor patient progress toward functional and medical goals  Care Tool:  Bathing    Body parts bathed by patient: Right arm, Left arm, Chest, Abdomen, Front perineal area, Buttocks, Right upper leg, Left upper leg, Face, Right lower leg, Left lower leg   Body parts  bathed by helper: Buttocks, Right lower leg, Left lower leg     Bathing assist Assist Level: Contact Guard/Touching assist     Upper Body Dressing/Undressing Upper body dressing   What is the patient wearing?: Pull over shirt    Upper body assist Assist Level: Supervision/Verbal cueing    Lower Body Dressing/Undressing Lower body dressing      What is the patient wearing?: Pants, Underwear/pull up     Lower body assist Assist for lower body dressing: Contact Guard/Touching assist     Toileting Toileting    Toileting assist Assist for toileting: Maximal Assistance - Patient 25 - 49%     Transfers Chair/bed transfer  Transfers assist     Chair/bed transfer assist level: Supervision/Verbal cueing     Locomotion Ambulation   Ambulation assist      Assist level: Supervision/Verbal cueing Assistive device: Walker-rolling Max distance: 15 ft   Walk 10 feet activity   Assist     Assist level: Supervision/Verbal cueing Assistive device: Walker-rolling   Walk 50 feet activity   Assist Walk 50 feet with 2 turns activity did not occur: Safety/medical concerns  Assist level: Supervision/Verbal cueing Assistive device: Walker-rolling    Walk 150 feet activity   Assist Walk 150  feet activity did not occur: Safety/medical concerns  Assist level: Supervision/Verbal cueing Assistive device: Walker-rolling    Walk 10 feet on uneven surface  activity   Assist     Assist level: Minimal Assistance - Patient > 75% Assistive device: Walker-rolling   Wheelchair     Assist Is the patient using a wheelchair?: Yes Type of Wheelchair: Manual    Wheelchair assist level: Maximal Assistance - Patient 25 - 49% Max wheelchair distance: 40    Wheelchair 50 feet with 2 turns activity    Assist        Assist Level: Total Assistance - Patient < 25%   Wheelchair 150 feet activity     Assist      Assist Level: Total Assistance - Patient  < 25%   Blood pressure (!) 148/81, pulse 70, temperature 98.1 F (36.7 C), temperature source Oral, resp. rate 18, height 5\' 6"  (1.676 m), weight 77.2 kg, SpO2 100 %.    Medical Problem List and Plan: 1.  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  Impaired mobility: may discontinued heparin given improved ambulation.             -antiplatelet therapy: Continue Aspirin and plavix held due to GI bleed 3. Low back pain: Continue Lidoderm patch, Robaxin as needed muscle spasms. Kpad added.              Monitor with increased exertion 4. Depression: Continue Paxil 20 mg daily             -antipsychotic agents: N/A 5. Neuropsych: This patient is capable of making decisions on his own behalf. 6. Skin/Wound Care: Routine skin checks 7. Fluids/Electrolytes/Nutrition: Routine in and outs           K+ back to nl , creat mild elevation  8.  Hypertension.  Continue Norvasc 10 mg daily, Hygroton 25 mg daily.  Added clonidine patch for HTN/low back pain  Hydralazine 10 3 times daily started on 10/16  Continue clonidine 0.1mg  for 1 week and adjust at that point if needed. Continue to monitor for effects of yesterday's change  Much better controlled Vitals:   12/20/20 0455 12/20/20 0811  BP: 140/84 (!) 148/81  Pulse: 76 70  Resp: 18 18  Temp: 98.1 F (36.7 C)   SpO2: 96% 100%    9.  Hyperlipidemia: Continue Lipitor 10.  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  Follow-up outpatient 11.  History of tobacco use as well as marijuana.  Urine drug screen positive marijuana.  Counseling 12.  History  of hematuria/renal colic.  Continue to monitor 13.  Medical noncompliance.  Counseling 14. AKI:  Cr improved to 1.34, improving  6- 8 glasses of water per day- placed nursing order Improved, may d/c IV 15. Overweight: BMI 26.65: provide dietary education 16.  Transaminitis  Downtrending, discussed with patient.  17.  Slow transit constipation: continue miralax BID. Added prune juice with meals and colace daily. 18. GI bleed: started on daily protonix. Plavix resumed as per GI. Discussed with GI- most likely due to constipation. Resolved. Continue to monitor hemoglobin outpatient 19. Hypokalemia resolved : 04/14/2020 daily  supplement started  LOS: 14 days A FACE TO FACE EVALUATION WAS PERFORMED  Alec Sanchez 12/20/2020, 10:55 AM

## 2020-12-20 NOTE — Progress Notes (Incomplete)
Patient refused Myrlax last night other wise cooperative , and with no noted distress or discomfort. Call bell and bed alarm on withend reac

## 2020-12-20 NOTE — Progress Notes (Signed)
Patient ID: Alec Sanchez, male   DOB: [birth year], 65 y.o.   MRN: [id number] Follow up with the patient regarding pending discharge. Patient noted his stomach appears to be better and no more bloody stools. Slept better and feels ready for discharge. Noted no concerns regarding discharge tomorrow. Pamelia Hoit

## 2020-12-20 NOTE — Progress Notes (Signed)
Inpatient Rehabilitation Care Coordinator Discharge Note   Patient Details  Name: Alec Sanchez MRN: [id number] Date of Birth: 24-Jan-1956   Discharge location: HOME ALONE WITH FAMILY CMOING IN AND OUT TO ASSIST AND PROVIDED MEALS PRIOR TO ADMISSION  Length of Stay:  15 DAYS  Discharge activity level: MOD/I LEVEL  Home/community participation: ACTIVE  Patient response MW:NUUVOZ Literacy - How often do you need to have someone help you when you read instructions, pamphlets, or other written material from your doctor or pharmacy?: Never  Patient response DG:UYQIHK Isolation - How often do you feel lonely or isolated from those around you?: Rarely  Services provided included: MD, RD, PT, OT, RN, CM, TR, Pharmacy, SW  Financial Services:  Field seismologist Utilized: Private Insurance [facility] MEDICARE  Choices offered to/list presented to: PT  Follow-up services arranged:  Home Health, Patient/Family has no preference for HH/DME agencies, DME Home Health Agency: [facility]    DME : [facility] OBTAIN[facility] EQUIP[facility] FOR PT-ROLLING WALKER & 3 IN 1    Patient response to transportation need: Is the patient able to respond to transportation needs?: Yes In the past 12 months, has lack of transportation kept you from medical appointments or from getting medications?: No In the past 12 months, has lack of transportation kept you from meetings, work, or from getting things needed for daily living?: No    Comments (or additional information): PT AWARE NOT TO DRIVE AT DISCHARGE FAMILY TO BRING MEALS TO HIM. PT FEELS READY TO GO HOME AND IS HAPPY WITH HIS PROGRESS WHILE HERE. Alec Sanchez-DAUGHTER HAS FMLA COMPLETED FORMS IF NEEDS THEM  Patient/Family verbalized understanding of follow-up arrangements:  Yes  Individual responsible for coordination of the follow-up plan: Alec Sanchez-DAUGHTER [phone]  Confirmed correct DME delivered: Lucy Chris 12/20/2020    Towanna Avery, Lemar Livings

## 2020-12-20 NOTE — Discharge Summary (Signed)
Physical Therapy Discharge Summary  Patient Details  Name: Alec Sanchez MRN: [id number] Date of Birth: April 06, 1955  Today's Date: 12/20/2020 PT Individual Time: 1000-1055 PT Individual Time Calculation (min): 55 min   Patient has met 7 of 9 long term goals due to improved activity tolerance, improved balance, improved postural control, increased strength, and ability to compensate for deficits.  Patient to discharge at an ambulatory level Modified Independent for short distance household ambulation - requires supervision for longer distance 2/2 fatigue. His progress and mobility potential limited by apathy of therapy and poor participation during his rehab stay. He also reports a pretty sedentary lifestyle at baseline with daily naps and lots of TV watching. He also reports that neighbors and friends/family assist with meals and groceries. Patient's care partner is independent to provide the necessary cognitive assistance at discharge.  Reasons goals not met: Pt required supervision for car transfers and CGA for stairs due to safety concerns. Pt has an elevator in his home and does not normally go up/down stairs unless absolutely needed. Otherwise, pt is adequate for DC and at goal level.  Recommendation:  Patient will benefit from ongoing skilled PT services in home health setting to continue to advance safe functional mobility, address ongoing impairments in dynamic standing balance, home safety, home management, fall prevention, gait training with LRAD, global strengthening and endurance training, and minimize fall risk.  Equipment: RW  Reasons for discharge: treatment goals met and discharge from hospital  Patient/family agrees with progress made and goals achieved: Yes  PT Discharge Precautions/Restrictions Precautions Precautions: Fall Precaution Comments: mild R hemi, delayed processing, posterior bias Restrictions Weight Bearing Restrictions: No Pain Pain Assessment Pain Scale:  0-10 Pain Score: 0-No pain Pain Interference Pain Interference Pain Effect on Sleep: 1. Rarely or not at all (back pain) Pain Interference with Therapy Activities: 1. Rarely or not at all (back pain) Pain Interference with Day-to-Day Activities: 1. Rarely or not at all (back pain) Vision/Perception  Vision - History Ability to See in Adequate Light: 0 Adequate Perception Perception: Within Functional Limits Praxis Praxis: Impaired Praxis Impairment Details: Initiation  Cognition Overall Cognitive Status: Within Functional Limits for tasks assessed Arousal/Alertness: Awake/alert Orientation Level: Oriented X4 Awareness: Appears intact Problem Solving: Impaired Problem Solving Impairment: Functional complex Safety/Judgment: Appears intact Sensation Sensation Light Touch: Appears Intact Hot/Cold: Appears Intact Proprioception: Appears Intact Stereognosis: Appears Intact Coordination Gross Motor Movements are Fluid and Coordinated: Yes Fine Motor Movements are Fluid and Coordinated: Yes Heel Shin Test: [facility] Motor  Motor Motor: Within Functional Limits;Hemiplegia;Abnormal postural alignment and control Motor - Discharge Observations: Mild R hemi  Mobility Bed Mobility Bed Mobility: Sit to Supine;Right Sidelying to Sit;Rolling Right;Supine to Sit Rolling Right: Independent Right Sidelying to Sit: Independent Supine to Sit: Independent Sit to Supine: Independent Transfers Transfers: Sit to Stand;Stand to Sit;Stand Pivot Transfers Sit to Stand: Independent with assistive device Stand to Sit: Independent with assistive device Stand Pivot Transfers: Independent with assistive device Transfer (Assistive device): Rolling walker Locomotion  Gait Ambulation: Yes Gait Assistance: Independent with assistive device Gait Distance (Feet): 150 Feet Assistive device: Rolling walker Gait Gait: Yes Gait Pattern:  (decreased knee flexion in swing, bilaterally) Gait Pattern: Within  Functional Limits;Poor foot clearance - right;Decreased weight shift to right Stairs / Additional Locomotion Stairs: Yes Stairs Assistance: Contact Guard/Touching assist Stair Management Technique: Two rails;Forwards Number of Stairs: 12 Height of Stairs: 6 Wheelchair Mobility Wheelchair Mobility: No  Trunk/Postural Assessment  Cervical Assessment Cervical Assessment: Within Scientist, physiological  Assessment: Exceptions to [facility] (rounded shoulders) Lumbar Assessment Lumbar Assessment: Exceptions to [facility] (posterior pelvic tilt) Postural Control Postural Control: Within Functional Limits (posterior bias but [facility])  Balance Balance Balance Assessed: Yes Static Sitting Balance Static Sitting - Balance Support: Feet supported;No upper extremity supported Static Sitting - Level of Assistance: 7: Independent Dynamic Sitting Balance Dynamic Sitting - Balance Support: Feet supported Dynamic Sitting - Level of Assistance: 6: Modified independent (Device/Increase time) Static Standing Balance Static Standing - Balance Support: Bilateral upper extremity supported Static Standing - Level of Assistance: 6: Modified independent (Device/Increase time) Dynamic Standing Balance Dynamic Standing - Balance Support: During functional activity;Bilateral upper extremity supported Dynamic Standing - Level of Assistance: 6: Modified independent (Device/Increase time) Extremity Assessment  RLE Assessment RLE Assessment: Within Functional Limits General Strength Comments: Grossly 4+/5 LLE Assessment LLE Assessment: Within Functional Limits General Strength Comments: Grossly 5/5  Skilled Intervention: Pt seen sitting in recliner, agreeable to PT treatment but required some convincing and encouragement. Completes sit<>stand from recliner height to RW mod I and ambulates from his room to main rehab gym, downstairs, with mod I and RW (fading to supervision after >117f due to fatigue) -  standing rest break in the elevator. Completed car transfer at supervision level with RW with car height simulating their sedan height. He demonstrates ability to navigate up/down 141framp with supervision and RW as well. Ambulates ~15090fith supervision and RW to main rehab gym where pt begins to c/o fatigue and feeling "woozy" during mid-session. Assisted to supine at supervision level on mat table to take BP:  Supine: 124/88 HR 70 Sitting edge of mat: 114/77 HR 79 Standing: 133/69 HR 82  Suspect symptoms are related to fatigue from activity. Pt defer's stair training this session due to fatigue. He ambulates back upstairs to his room, requiring supervision and RW, supervision assist due to fatigue. He required frequent cueing for keeping body close to walker frame and monitoring his R foot during turns as it tends to stay outside walker frame. Pt assisted to supine in bed at mod I level and remained in bed at end of session with bed alarm on and all needs in reach.    Reaghan Kawa P Kieli Golladay PT, DPT 12/20/2020, 7:47 AM

## 2020-12-20 NOTE — Progress Notes (Signed)
Occupational Therapy Session Note  Patient Details  Name: Alec Sanchez MRN: [id number] Date of Birth: April 13, 1955  Today's Date: 12/20/2020 OT Individual Time: 0900-1000 OT Individual Time Calculation (min): 60 min    Short Term Goals: Week 2:  OT Short Term Goal 1 (Week 2): STGs=LTGs due to ELOS   Skilled Therapeutic Interventions/Progress Updates:    Pt semi reclined in bed, no c/o pain,agreeable to therapy.  Pt reports he usually fixes cereal for breakfast, and simple stovetop or microwaveable meals at home.  Pt completed supine to sit EOB with mod I.  Donned shirt and pants with mod I without AD.  Pt ambulated using RW to ADL kitchen approximately 269 feet with one standing rest break on elevator with close supervision.  Pt educated on compensatory and safety strategies in the kitchen at [facility] ambulatory level including straining food from hot liquids over stovetop using bowl and strainer spoon (instead of carrying hot pot to sink), and dragging coffee pot on pot holder from base to sink as needed.  Pt also educated on safe placement of RW when reaching in low cabinets for items such as pots.  Pt completed with supervision.  Provided pt with walker bag and educated pt on use to transfer ADL items as needed from place to place.  Pt ambulated back to room using RW with close supervision.  Stand to sit at recliner with mod I.  Call bell in reach at end of session. BLE elevated.  Therapy Documentation Precautions:  Precautions Precautions: Fall Precaution Comments: mild R hemi, delayed processing, posterior bias Restrictions Weight Bearing Restrictions: No   Therapy/Group: Individual Therapy  Amie Critchley 12/20/2020, 1:17 PM

## 2020-12-20 NOTE — Patient Care Conference (Signed)
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 Sula Soda Social Worker Present: Dossie Der, LCSW Nurse Present: Chana Bode, RN PT Present: Wynelle Link, PT OT Present: Dolphus Jenny, OT SLP Present: Elio Forget, SLP PPS Coordinator present : Edson Snowball, PT     Current Status/Progress Goal Weekly Team Focus  Bowel/Bladder     Continent   Continent   Toileting  Swallow/Nutrition/ Hydration             ADL's   distant supervision-mod I functional transfers and self care  mod I  self care training, functional transfers, functional cognition, dc planning.   Mobility   mod I bed mobility, mod I sit<>stand to RW, supervision transfers and long distance (>216ft) gait with RW. Multiple refusals during hisi stay, limiting his potential and progress - lives sedentary lifestyle at baseline  mod I  Global endurance and strengthening, gait training, safety awareness, DC planning   Communication             Safety/Cognition/ Behavioral Observations            Pain     Low back pain (chronic)   Managed pain with prn   Assess need  for and effectiveness of meds for pain  Skin     N/A          Discharge Planning:  reachong goals of mod/i level and pt feels ready to go home. Daughter has FMLA completed forms and home health set up for follow up   Team Discussion: Low back pain; K-pad added. BP stable.  Patient on target to meet rehab goals: yes, currently distant supervision - mod I overall. Needs mod I for household ambulation  *See Care Plan and progress notes for long and short-term goals.   Revisions to Treatment Plan:  None   Teaching Needs: Safety, medications, secondary risk management, ambulation, etc  Current Barriers to Discharge: Home enviroment access/layout and Lack of/limited family support  Possible Resolutions to Barriers: HH follow up services FMLA paperwork completed for daughter     Medical Summary Current Status: kidney function improved, low back pain, hypertension better controlled, no more bloody stools    Barriers to Discharge Comments: elevated creatinine, low back pain Possible Resolutions to [employer] Focus: stable for d/c tomorrow, continue to encourage oral hydration, continue kpad   Continued Need for Acute Rehabilitation Level of Care: The patient requires daily medical management by a physician with specialized training in physical medicine and rehabilitation for the following reasons: Direction of a multidisciplinary physical rehabilitation program to maximize functional independence : Yes Medical management of patient stability for increased activity during participation in  an intensive rehabilitation regime.: Yes Analysis of laboratory values and/or radiology reports with any subsequent need for medication adjustment and/or medical intervention. : Yes   I attest that I was present, lead the team conference, and concur with the assessment and plan of the team.   Chana Bode B 12/20/2020, 4:26 PM

## 2020-12-20 NOTE — Progress Notes (Signed)
Occupational Therapy Discharge Summary  Patient Details  Name: Alec Sanchez MRN: [id number] Date of Birth: September 30, 1955  Today's Date: 12/20/2020 OT Individual Time: 0900-1000 OT Individual Time Calculation (min): 60 min    Patient has met 12 of 13 long term goals due to improved activity tolerance, improved balance, postural control, ability to compensate for deficits, functional use of  RIGHT upper and RIGHT lower extremity, improved attention, improved awareness, and improved coordination.  Patient to discharge at overall Modified Independent level.  Patient's care partner is independent to provide the necessary cognitive assistance at discharge.    Reasons goals not met: Pt requires occasional cue's for safe RW management during IADLs. Pt will have intermittent assist from family for meal prep and other IADL completion therefore safe for discharge.  Recommendation:  Pt will benefit from continued skilled OT services in the setting of home health to ensure safe environmental transition, and further training in safe use of RW during self care and IADLs.    Equipment: BSC, RW  Reasons for discharge: treatment goals met and discharge from hospital  Patient/family agrees with progress made and goals achieved: Yes  OT Discharge Precautions/Restrictions  Precautions Precautions: Fall Precaution Comments: mild R hemi, delayed processing, posterior bias Restrictions Weight Bearing Restrictions: No Pain Pain Assessment Pain Scale: 0-10 Pain Score: 0-No pain ADL ADL Eating: Modified independent Where Assessed-Eating: Chair Grooming: Modified independent Where Assessed-Grooming: Standing at sink Upper Body Bathing: Modified independent Where Assessed-Upper Body Bathing: Shower Lower Body Bathing: Modified independent Where Assessed-Lower Body Bathing: Shower Upper Body Dressing: Modified independent (Device) Where Assessed-Upper Body Dressing: Edge of bed Lower Body Dressing:  Modified independent Where Assessed-Lower Body Dressing: Edge of bed Toileting: Modified independent Where Assessed-Toileting: Bedside Commode Toilet Transfer: Modified independent Toilet Transfer Method: Arts development officer: Geophysical data processor: Close supervision Social research officer, government Method: Heritage manager: Civil engineer, contracting with back Vision Baseline Vision/History: 0 No visual deficits Patient Visual Report: No change from baseline Vision Assessment?: No apparent visual deficits Perception  Perception: Within Functional Limits Praxis Praxis: Impaired Praxis Impairment Details: Initiation Cognition Overall Cognitive Status: Within Functional Limits for tasks assessed Arousal/Alertness: Awake/alert Orientation Level: Oriented X4 Year: 2022 Month: October Day of Week: Correct Attention: Focused;Sustained;Selective;Alternating Focused Attention: Appears intact Sustained Attention: Appears intact Selective Attention: Appears intact Alternating Attention: Appears intact Memory: Appears intact Immediate Memory Recall: Sock;Blue;Bed Memory Recall Sock: Without Cue Memory Recall Blue: Without Cue Memory Recall Bed: With Cue Awareness: Appears intact Problem Solving: Impaired Problem Solving Impairment: Functional complex Safety/Judgment: Appears intact Sensation Sensation Light Touch: Appears Intact Hot/Cold: Appears Intact Proprioception: Appears Intact Stereognosis: Appears Intact Coordination Gross Motor Movements are Fluid and Coordinated: Yes Fine Motor Movements are Fluid and Coordinated: Yes Finger Nose Finger Test: [facility] Motor  Motor Motor: Within Functional Limits;Hemiplegia;Abnormal postural alignment and control Motor - Discharge Observations: Mild R hemi Mobility  Bed Mobility Bed Mobility: Sit to Supine;Right Sidelying to Sit;Rolling Right;Supine to Sit Rolling Right: Independent Right Sidelying to  Sit: Independent Supine to Sit: Independent Sit to Supine: Independent Transfers Sit to Stand: Independent with assistive device Stand to Sit: Independent with assistive device  Trunk/Postural Assessment  Cervical Assessment Cervical Assessment: Within Functional Limits Thoracic Assessment Thoracic Assessment: Exceptions to [facility] (rounded shoulders) Lumbar Assessment Lumbar Assessment: Exceptions to [facility] (posterior pelvic tilt) Postural Control Postural Control: Within Functional Limits  Balance Balance Balance Assessed: Yes Static Sitting Balance Static Sitting - Balance Support: Feet supported;No upper extremity supported Static Sitting - Level  of Assistance: 7: Independent Dynamic Sitting Balance Dynamic Sitting - Balance Support: Feet supported Dynamic Sitting - Level of Assistance: 6: Modified independent (Device/Increase time) Static Standing Balance Static Standing - Balance Support: Bilateral upper extremity supported Static Standing - Level of Assistance: 6: Modified independent (Device/Increase time) Dynamic Standing Balance Dynamic Standing - Balance Support: During functional activity;Bilateral upper extremity supported Dynamic Standing - Level of Assistance: 6: Modified independent (Device/Increase time) Extremity/Trunk Assessment RUE Assessment RUE Assessment: Exceptions to [facility] General Strength Comments: grip 4-/5; WFL all other planes LUE Assessment LUE Assessment: Within Functional Limits   Alec Sanchez 12/20/2020, 1:31 PM

## 2020-12-20 NOTE — Progress Notes (Signed)
Patient ID: Alec Sanchez, male   DOB: 27-Nov-1955, 65 y.o.   MRN: [id number]  Met with pt to ask if has received his equipment-rolling walker & 3 in 1. He has at his home. Pt feels prepared for discharge tomorrow. He has met his goals of mod/I and daughter has FMLA forms completed in case needs to take to appointments and time off from job. Aware of team conference results and feels ready for discharge tomorrow.

## 2020-12-20 NOTE — Progress Notes (Signed)
Occupational Therapy Session Note  Patient Details  Name: Alec Sanchez MRN: [id number] Date of Birth: 08-26-1955  Today's Date: 12/20/2020 OT Individual Time: 3382-5053 OT Individual Time Calculation (min): 58 min    Short Term Goals: Week 2:  OT Short Term Goal 1 (Week 2): STGs=LTGs due to ELOS  Skilled Therapeutic Interventions/Progress Updates:  Pt greeted  supine in bed asleep but easily able to arouse and agreeable to OT intervention. Session focus on  BADL reeducation, FMC/ grip strength with RUE and IADL tasks. Pt completed bed mobility Independently. Pt completed sit<>stand and ambulatory toilet transfer with rw MOD I. MOD I for 3/3 toileting tasks.  pt completed hand hygiene standing at sink MODI. Pt transported ot therapy gym with total A for time mgmt. Issued pt HEP with level 2 theraputty to increase functional grip strength for higher BADLS at home. Pt completed simulated laundry task with pt transporting towels to laundry room using walker bag for transport MOD I. Pt able to reach down into washer/ dryer to collect/ place items MOD I. Pt did report he has to walk around the line of washers to get to the dryers on the other side of the community washer/dryer room. Pt able to transport towels back to w/c and stand and fold towels MOD I.  Pt transported back to room with total A where pt completed ambulatory transfer back to bed with RW MOD I. Pt returned to supine independently with  pt left  supine in bed with all needs within reach and bed alarm activated.                 Therapy Documentation Precautions:  Precautions Precautions: Fall Precaution Comments: mild R hemi, delayed processing, posterior bias Restrictions Weight Bearing Restrictions: No  Pain: pt reports no pain during session    Therapy/Group: Individual Therapy  Pollyann Glen [facility] 12/20/2020, 4:01 PM

## 2020-12-21 ENCOUNTER — Other Ambulatory Visit (HOSPITAL_COMMUNITY): Payer: Self-pay

## 2020-12-21 DIAGNOSIS — I639 Cerebral infarction, unspecified: Secondary | ICD-10-CM

## 2020-12-21 NOTE — Progress Notes (Signed)
   12/21/20 1215  Clinical Encounter Type  Visited With Patient not available  Visit Type Follow-up  Referral From Nurse  Consult/Referral To Chaplain   Chaplain responded to the consult request to notarize the Advance Directive. Per the attending nurse, the patient has been discharged. The attending nurse advised the family member that the A.D. could be notarized outside the hospital. This note was prepared by Deneen Harts, M.Div..  For questions please contact by phone [phone].

## 2020-12-21 NOTE — Progress Notes (Signed)
INPATIENT REHABILITATION DISCHARGE NOTE   Discharge instructions by: Deatra Ina  Verbalized understanding: By patient and daughter  Skin care/Wound care healing? N/A  Pain: none  IV's: none  Tubes/Drains: none  O2: room air  Safety instructions: fall risk  Patient belongings: packed and sent with daughter   Discharged to: home with daughter   Discharged via: personal vehicle   Notes:

## 2020-12-21 NOTE — Progress Notes (Signed)
PROGRESS NOTE   Subjective/Complaints: No new complaints this morning Ready for d/c home today Encouraged continued walking daily at home Requested Fabi to let family know we aim for 10am review of meds and follow-up appointments  ROS: Denies CP, SOB, +nausea reported as per nursing notes, denies bloody stools  Objective:   No results found. No results for input(s): WBC, HGB, HCT, PLT in the last 72 hours.   No results for input(s): NA, K, CL, CO2, GLUCOSE, BUN, CREATININE, CALCIUM in the last 72 hours.   Intake/Output Summary (Last 24 hours) at 12/21/2020 0913 Last data filed at 12/21/2020 0700 Gross per 24 hour  Intake 520 ml  Output 100 ml  Net 420 ml        Physical Exam: Vital Signs Blood pressure 136/78, pulse 67, temperature 98.3 F (36.8 C), temperature source Oral, resp. rate 18, height 5\' 6"  (1.676 m), weight 77.3 kg, SpO2 100 %. Gen: no distress, normal appearing HEENT: oral mucosa pink and moist, NCAT Cardio: Reg rate Chest: normal effort, normal rate of breathing Abd: soft, non-distended Ext: no edema Psych: pleasant, normal affect Neuro:Sensation intact to LT  Makes eye contact with examiner.   Speech is dysarthric but intelligible, stable.   Follows simple commands. Motor: 4-/5 RUE and RLE 5/5 on left side  unchanged  Assessment/Plan: 1. Functional deficits which require 3+ hours per day of interdisciplinary therapy in a comprehensive inpatient rehab setting. Physiatrist is providing close team supervision and 24 hour management of active medical problems listed below. Physiatrist and rehab team continue to assess barriers to discharge/monitor patient progress toward functional and medical goals  Care Tool:  Bathing    Body parts bathed by patient: Right arm, Left arm, Chest, Abdomen, Front perineal area, Buttocks, Right upper leg, Left upper leg, Face, Right lower leg, Left lower leg    Body parts bathed by helper: Buttocks, Right lower leg, Left lower leg     Bathing assist Assist Level: Independent with assistive device     Upper Body Dressing/Undressing Upper body dressing   What is the patient wearing?: Pull over shirt    Upper body assist Assist Level: Independent    Lower Body Dressing/Undressing Lower body dressing      What is the patient wearing?: Pants, Underwear/pull up     Lower body assist Assist for lower body dressing: Independent with assitive device     Toileting Toileting    Toileting assist Assist for toileting: Independent with assistive device     Transfers Chair/bed transfer  Transfers assist     Chair/bed transfer assist level: Independent with assistive device Chair/bed transfer assistive device:   Ambulation assist      Assist level: Independent with assistive device Assistive device: Walker-rolling Max distance: 113ft   Walk 10 feet activity   Assist     Assist level: Independent with assistive device Assistive device: Walker-rolling   Walk 50 feet activity   Assist Walk 50 feet with 2 turns activity did not occur: Safety/medical concerns  Assist level: Independent with assistive device Assistive device: Walker-rolling    Walk 150 feet activity   Assist Walk 150 feet  activity did not occur: Safety/medical concerns  Assist level: Independent with assistive device Assistive device: Walker-rolling    Walk 10 feet on uneven surface  activity   Assist     Assist level: Supervision/Verbal cueing Assistive device: Walker-rolling   Wheelchair     Assist Is the patient using a wheelchair?: No Type of Wheelchair: Manual    Wheelchair assist level: Maximal Assistance - Patient 25 - 49% Max wheelchair distance: 40    Wheelchair 50 feet with 2 turns activity    Assist        Assist Level: Total Assistance - Patient < 25%   Wheelchair 150 feet  activity     Assist      Assist Level: Total Assistance - Patient < 25%   Blood pressure 136/78, pulse 67, temperature 98.3 F (36.8 C), temperature source Oral, resp. rate 18, height 5\' 6"  (1.676 m), weight 77.3 kg, SpO2 100 %.    Medical Problem List and Plan: 1.  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  Impaired mobility: may discontinued heparin given improved ambulation.             -antiplatelet therapy: Continue Aspirin and plavix held due to GI bleed 3. Low back pain: Continue Lidoderm patch, Robaxin as needed muscle spasms. Kpad added.              Monitor with increased exertion 4. Depression: Continue Paxil 20 mg daily             -antipsychotic agents: N/A 5. Neuropsych: This patient is capable of making decisions on his own behalf. 6. Skin/Wound Care: Routine skin checks 7. Fluids/Electrolytes/Nutrition: Routine in and outs           K+ back to nl , creat mild elevation  8.  Hypertension.  Continue Norvasc 10 mg daily, Hygroton 25 mg daily.  Added clonidine patch for HTN/low back pain  Hydralazine 10 3 times daily started on 10/16  Continue clonidine 0.1mg  for 1 week and adjust at that point if needed. Continue to monitor for effects of yesterday's change  Much better controlled Vitals:   12/21/20 0530 12/21/20 0802  BP: 130/81 136/78  Pulse: 69 67  Resp: 16 18  Temp: (!) 97.5 F (36.4 C) 98.3 F (36.8 C)  SpO2: 100% 100%    9.  Hyperlipidemia: Continue Lipitor 10.  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  Follow-up outpatient 11.  History of tobacco use as well as marijuana.  Urine drug screen positive  marijuana.  Counseling 12.  History of hematuria/renal colic.  Continue to monitor 13.  Medical noncompliance.  Counseling 14. AKI:  Cr improved to 1.34, improving  6- 8 glasses of water per day- placed nursing order Improved, may d/c IV 15. Overweight: BMI 26.65: provide dietary education 16.  Transaminitis  Downtrending, discussed with patient.  17.  Slow transit constipation: continue miralax BID. Added prune juice with meals and colace daily. 18. GI bleed: started on daily protonix. Plavix resumed as per GI. Discussed with GI- most likely due to constipation. Resolved. Continue to monitor hemoglobin outpatient 19. Hypokalemia resolved : 04/14/2020 daily supplement started   >30 minutes spent in discharge  of patient including review of medications and follow-up appointments, physical examination, and in answering all patient's questions   LOS: 15 days A FACE TO FACE EVALUATION WAS PERFORMED  Drema Pry Gowri Suchan 12/21/2020, 9:13 AM

## 2020-12-21 NOTE — Progress Notes (Addendum)
Inpatient Rehabilitation Discharge Medication Review by a Pharmacist  A complete drug regimen review was completed for this patient to identify any potential clinically significant medication issues.  High Risk Drug Classes Is patient taking? Indication by Medication  Antipsychotic No   Anticoagulant No   Antibiotic No   Opioid No   Antiplatelet Yes Clopidogrel for stroke prophylaxis  Hypoglycemics/insulin No   Vasoactive Medication Yes Amlodipine, hydralazine, and clonidine for HTN   Chemotherapy No   Other No      Type of Medication Issue Identified Description of Issue Recommendation(s)  Drug Interaction(s) (clinically significant)     Duplicate Therapy     Allergy     No Medication Administration End Date     Incorrect Dose     Additional Drug Therapy Needed     Significant med changes from prior encounter (inform family/care partners about these prior to discharge). Stop aspirin (due to GI bleed), chlorthalidone, lisinopril, percocet, prazosin, sildenafil from home med list. Start clonidine for BP control   Lisinopril is not listed in AVS, consider adding to DC instructions and instruct patient not to resume   Other  Clarified that neurology originally planned to stop clopidogrel and resume aspirin indefinitely but patient had a GI bleed so has been switched to clopidogrel per GI until follow up. Clopidogrel directions state to continue for 21 days but Rx was written for 30 tablets.  Instruct patient to continue clopidogrel until follow up with Neurology     Clinically significant medication issues were identified that warrant physician communication and completion of prescribed/recommended actions by midnight of the next day:  Yes  Name of provider notified for urgent issues identified: Deatra Ina  Provider Method of Notification: Secure chat   ADDENDUM: all issues resolved after discussion with PA, see above description   Time spent performing this drug regimen  review (minutes):  15  Alphia Moh, PharmD, [place], [facility] Clinical Pharmacist  Please check AMION for all [facility] phone numbers After 10:00 PM, call Main Pharmacy [phone]

## 2020-12-25 ENCOUNTER — Telehealth: Payer: Self-pay

## 2020-12-25 NOTE — Telephone Encounter (Signed)
Transitional Care call--daughter Tiffany    Are you/is patient experiencing any problems since coming home? No Are there any questions regarding any aspect of care? No Are there any questions regarding medications administration/dosing? No Are meds being taken as prescribed? Yes Patient should review meds with caller to confirm Have there been any falls? No Has Home Health been to the house and/or have they contacted you? Yes If not, have you tried to contact them? Can we help you contact them? Are bowels and bladder emptying properly? Yes Are there any unexpected incontinence issues? No If applicable, is patient following bowel/bladder programs? Any fevers, problems with breathing, unexpected pain? No Are there any skin problems or new areas of breakdown? No Has the patient/family member arranged specialty MD follow up (ie cardiology/neurology/renal/surgical/etc)? Yes Can we help arrange? Does the patient need any other services or support that we can help arrange? No Are caregivers following through as expected in assisting the patient? Yes Has the patient quit smoking, drinking alcohol, or using drugs as recommended? Yes  Appointment time 1:20 pm, arrive time 1:00 pm on 01/10/21 with Dr. Carlis Abbott [phone]

## 2020-12-27 ENCOUNTER — Encounter: Payer: Self-pay | Admitting: Internal Medicine

## 2020-12-29 ENCOUNTER — Telehealth: Payer: Self-pay | Admitting: Pharmacist

## 2020-12-29 NOTE — Telephone Encounter (Signed)
Pharmacy Transitions of Care Follow-up Telephone Call  Call attempted for a pharmacy transitions of care follow-up. HIPAA appropriate voicemail was left with call back information provided.        Call attempt #1. Will follow-up in 2-3 days.  

## 2021-01-23 ENCOUNTER — Ambulatory Visit: Payer: No Typology Code available for payment source | Admitting: Internal Medicine

## 2021-01-26 ENCOUNTER — Ambulatory Visit (INDEPENDENT_AMBULATORY_CARE_PROVIDER_SITE_OTHER): Payer: No Typology Code available for payment source | Admitting: Internal Medicine

## 2021-01-26 ENCOUNTER — Encounter: Payer: Self-pay | Admitting: Internal Medicine

## 2021-01-26 VITALS — BP 164/84 | HR 73 | Ht 68.0 in | Wt 189.0 lb

## 2021-01-26 DIAGNOSIS — K5900 Constipation, unspecified: Secondary | ICD-10-CM

## 2021-01-26 DIAGNOSIS — K625 Hemorrhage of anus and rectum: Secondary | ICD-10-CM | POA: Diagnosis not present

## 2021-01-26 DIAGNOSIS — Z1211 Encounter for screening for malignant neoplasm of colon: Secondary | ICD-10-CM

## 2021-01-26 NOTE — Patient Instructions (Signed)
If you are age 66 or older, your body mass index should be between 23-30. Your Body mass index is 28.74 kg/m. If this is out of the aforementioned range listed, please consider follow up with your Primary Care Provider.  If you are age 80 or younger, your body mass index should be between 19-25. Your Body mass index is 28.74 kg/m. If this is out of the aformentioned range listed, please consider follow up with your Primary Care Provider.   ________________________________________________________  The [facility] providers would like to encourage you to use [facility] to communicate with providers for non-urgent requests or questions.  Due to long hold times on the telephone, sending your provider a message by [facility] may be a faster and more efficient way to get a response.  Please allow 48 business hours for a response.  Please remember that this is for non-urgent requests.  _______________________________________________________  I will call you in about 5 months to schedule a colonoscopy

## 2021-01-26 NOTE — Progress Notes (Signed)
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 He was instructed to take aspirin and Plavix for 3 weeks, followed by aspirin alone.   He was then transferred to acute rehab where he was admitted 10/12-10/27. While in rehab, he had an episode of hematochezia. During that hospitalization it was suspected that his rectal bleeding is due to stercoral ulceration. Patient refused to have colonoscopy or flexible sigmoidoscopy during that hospitalization. Since he described issues with constipation, it was recommended that he take Miralax QD to avoid constipation.   He has been doing well since he was discharged from rehab. He has not seen any recurrent rectal bleeding since his hospitalization. He is having on average one stool per day. He has been taking his Miralax every day. Has been able to eat. Denies N&V, ab pain, dysphagia. He is now on Plavix alone. No longer takes aspirin. He has never had a colonoscopy in the past. Denies fam hx of colon caner.    Past Medical History:  Diagnosis Date   Anxiety    Carotid artery occlusion    Depression    Hyperlipidemia    Hypertension    Stroke [facility])      Past Surgical History:  Procedure Laterality Date   ANKLE SURGERY Left    TRANSCAROTID ARTERY REVASCULARIZATION  Right 04/12/2020   Procedure: RIGHT TRANSCAROTID ARTERY REVASCULARIZATION;  Surgeon: Waynetta Sandy, MD;  Location: [facility] OR;  Service: Vascular;  Laterality: Right;   ULTRASOUND GUIDANCE FOR VASCULAR ACCESS Left 04/12/2020   Procedure: ULTRASOUND GUIDANCE FOR VASCULAR ACCESS, left femoral vein;  Surgeon: Waynetta Sandy, MD;   Location: [place];  Service: Vascular;  Laterality: Left;   Family History  Problem Relation Age of Onset   Heart failure Mother    Stroke Mother    Stroke Father    Hypertension Father    Diabetes Father    GER disease Cousin    Social History   Tobacco Use   Smoking status: Never   Smokeless tobacco: Never  Vaping Use   Vaping Use: Never used  Substance Use Topics   Alcohol use: Not Currently   Drug use: Never   Current Outpatient Medications  Medication Sig Dispense Refill   acetaminophen (TYLENOL) 325 MG tablet Take 2 tablets (650 mg total) by mouth every 6 (six) hours as needed for mild pain (or Fever >/= 101).     amLODipine (NORVASC) 10 MG tablet Take 1 tablet (10 mg total) by mouth every morning. 30 tablet 0   atorvastatin (LIPITOR) 80 MG tablet Take 1 tablet (80 mg total) by mouth at bedtime. 30 tablet 0   Cholecalciferol 25 MCG (1000 UT) tablet Take 1 tablet (1,000 Units total) by mouth every morning. 30 tablet 0   cloNIDine (CATAPRES) 0.1 MG tablet Take 1 tablet (0.1 mg total) by mouth 2 (two) times daily. 60 tablet 0   clopidogrel (PLAVIX) 75 MG tablet Take 75 mg by mouth daily.     docusate sodium (COLACE) 100 MG capsule Take 1 capsule (100 mg total) by mouth daily. 10 capsule 0   ferrous sulfate 325 (65 FE) MG tablet Take 1 tablet (325 mg total) by mouth  every Monday, Wednesday, and Friday. 30 tablet 0   hydrALAZINE (APRESOLINE) 10 MG tablet Take 1 tablet (10 mg total) by mouth every 8 (eight) hours. 90 tablet 0   lidocaine (LIDODERM) 5 % Place 1 patch onto the skin daily. Remove & Discard patch within 12 hours or as directed by MD 30 patch 0   pantoprazole (PROTONIX) 40 MG tablet Take 1 tablet (40 mg total) by mouth daily. 30 tablet 0   PARoxetine (PAXIL) 20 MG tablet Take 1 tablet (20 mg total) by mouth daily. 30 tablet 0   polyethylene glycol (MIRALAX / GLYCOLAX) 17 g packet Take 17 g by mouth 2 (two) times daily. 14 each 0   No current facility-administered  medications for this visit.   Allergies  Allergen Reactions   Trazodone Nausea And Vomiting     Review of Systems: All systems reviewed and negative except where noted in HPI.   Physical Exam: BP (!) 164/84   Pulse 73   Ht 5\' 8"  (1.727 m)   Wt 189 lb (85.7 kg)   SpO2 99%   BMI 28.74 kg/m  Constitutional: Pleasant,well-developed, male in no acute distress. HEENT: Normocephalic and atraumatic. Conjunctivae are normal. No scleral icterus. Cardiovascular: Normal rate, regular rhythm.  Pulmonary/chest: Effort normal and breath sounds normal. No wheezing, rales or rhonchi. Abdominal: Soft, nondistended, nontender. Quiet bowel sounds. Extremities: No edema Neurological: Alert and oriented to person place and time. Skin: Skin is warm and dry. No rashes noted. Psychiatric: Normal mood and affect. Behavior is normal.  Labs 11/2020: last CBC with mildly elevated WBC of 12.7 (looks like it had been improving), last BMP with Cr of 1.34  CTA C/A/P w/contrast 12/01/20: IMPRESSION: 1. 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 Findings could reflect acute IMA occlusion or underlying vasculitis. The distal branches of the IMA enhance via collateral flow, with no evidence of bowel ischemia within the distal colon. 2. No evidence of thoracoabdominal aortic aneurysm or dissection. 3. Multifocal atherosclerosis with significant stenoses at the origin of the innominate artery, left common carotid artery, and right renal artery as above. 4. No evidence of pulmonary embolus. 5. Otherwise no acute intrathoracic, intra-abdominal, or intrapelvic process. 6. 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 7.  Aortic Atherosclerosis (ICD10-I70.0).  ASSESSMENT AND PLAN:  Rectal bleeding Colon cancer screening Constipation Patient had an episode of rectal bleeding when he was hospitalized in acute  rehab after a stroke. He declined colonoscopy at that time, but he is amenable to getting colonoscopy now. He has never had a colonoscopy in the past. Will plan to delay his colonoscopy until he is about 6 months out from stroke. Will reach to the patient[facility] physician regarding Plavix periprocedural management. - Blood pressure is slightly elevated in clinic today but the patient states that he has not taken half of his morning medications. He will take these medications once he gets home - Colonoscopy [facility] recall in 5 months. Please call patient and patient's daughter when scheduling the procedure. Will reach out to [facility] physician Dr. 01/31/21 about whether she would feel comfortable about holding his Plavix for 5 days beforehand.  Willa Rough, MD

## 2021-01-30 ENCOUNTER — Encounter
Payer: Medicare Other | Attending: Physical Medicine and Rehabilitation | Admitting: Physical Medicine and Rehabilitation

## 2021-01-30 ENCOUNTER — Other Ambulatory Visit: Payer: Self-pay

## 2021-01-30 ENCOUNTER — Encounter: Payer: Self-pay | Admitting: Physical Medicine and Rehabilitation

## 2021-01-30 VITALS — BP 184/104 | HR 71 | Temp 98.3°F | Ht 68.0 in | Wt 192.0 lb

## 2021-01-30 DIAGNOSIS — I639 Cerebral infarction, unspecified: Secondary | ICD-10-CM

## 2021-01-30 DIAGNOSIS — K5901 Slow transit constipation: Secondary | ICD-10-CM

## 2021-01-30 DIAGNOSIS — I10 Essential (primary) hypertension: Secondary | ICD-10-CM | POA: Diagnosis present

## 2021-01-30 MED ORDER — CLONIDINE HCL 0.1 MG PO TABS: 0.2000 mg | ORAL_TABLET | Freq: Two times a day (BID) | ORAL | 0 refills | Status: AC

## 2021-01-30 MED ORDER — HYDRALAZINE HCL 10 MG PO TABS: 10.0000 mg | ORAL_TABLET | Freq: Three times a day (TID) | ORAL | 0 refills | Status: AC

## 2021-01-30 NOTE — Progress Notes (Signed)
Subjective:    Patient ID: Alec Sanchez, male    DOB: [birth year], 65 y.o.   MRN: [id number]  HPI Alec Sanchez is a 65 year old man who presents for hospital follow-up after CIR admission   BP cuff is broken at home so has not been checking regularly  Denies pain  Would like a handicap placard  Daughter has been trying to visit as much as possible  Does not need refills other than Hydralazine  No constipation  GI is going to schedule colonoscopy.   Pain Inventory Average Pain 0 Pain Right Now 0   BOWEL Number of stools per week: 7 Oral laxative use Yes  Type of laxative miralax  BLADDER Normal and Pads Frequent urination Yes     Mobility use a cane use a walker how many minutes can you walk? 10 ability to climb steps?  yes do you drive?  yes  Function disabled: date disabled 11/2020 retired I need assistance with the following:  bathing, meal prep, household duties, shopping, and meds  Neuro/Psych confusion depression anxiety  Prior Studies Any changes since last visit?  no  Physicians involved in your care Any changes since last visit?  no   Family History  Problem Relation Age of Onset   Heart failure Mother    Stroke Mother    Stroke Father    Hypertension Father    Diabetes Father    GER disease Cousin    Social History   Socioeconomic History   Marital status: Widowed    Spouse name: Not on file   Number of children: Not on file   Years of education: Not on file   Highest education level: Not on file  Occupational History   Not on file  Tobacco Use   Smoking status: Never   Smokeless tobacco: Never  Vaping Use   Vaping Use: Never used  Substance and Sexual Activity   Alcohol use: Not Currently   Drug use: Never   Sexual activity: Not on file  Other Topics Concern   Not on file  Social History Narrative   Not on file   Social Determinants of Health   Financial Resource Strain: Not on file  Food Insecurity: Not on file   Transportation Needs: Not on file  Physical Activity: Not on file  Stress: Not on file  Social Connections: Not on file   Past Surgical History:  Procedure Laterality Date   ANKLE SURGERY Left    TRANSCAROTID ARTERY REVASCULARIZATION  Right 04/12/2020   Procedure: RIGHT TRANSCAROTID ARTERY REVASCULARIZATION;  Surgeon: Maeola Harman, MD;  Location[facility] OR;  Service: Vascular;  Laterality: Right;   ULTRASOUND GUIDANCE FOR VASCULAR ACCESS Left 04/12/2020   Procedure: ULTRASOUND GUIDANCE FOR VASCULAR ACCESS, left femoral vein;  Surgeon: Maeola Harman, MD;  Location: [facility] OR;  Service: Vascular;  Laterality: Left;   Past Medical History:  Diagnosis Date   Anxiety    Carotid artery occlusion    Depression    Hyperlipidemia    Hypertension    Stroke (HCC)    BP (!) 184/104   Pulse 71   Temp 98.3 F (36.8 C)   Ht 5\' 8"  (1.727 m)   Wt 192 lb (87.1 kg)   SpO2 95%   BMI 29.19 kg/m   Opioid Risk Score:   Fall Risk Score:  `1  Depression screen PHQ 2/9  Depression screen PHQ 2/9 01/30/2021  Decreased Interest 2  Down, Depressed, Hopeless 2  PHQ -  2 Score 4  Altered sleeping 2  Tired, decreased energy 2  Change in appetite 2  Feeling bad or failure about yourself  2  Trouble concentrating 2  Moving slowly or fidgety/restless 2  Suicidal thoughts 0  PHQ-9 Score 16     Review of Systems  Constitutional: Negative.   HENT: Negative.    Eyes: Negative.   Respiratory:  Positive for wheezing.   Cardiovascular: Negative.   Gastrointestinal:  Positive for constipation.  Endocrine: Negative.   Genitourinary:  Positive for frequency.  Musculoskeletal:  Positive for gait problem.  Skin: Negative.   Allergic/Immunologic: Negative.   Neurological:  Positive for weakness.  Hematological:  Bruises/bleeds easily.       Plavix  Psychiatric/Behavioral:  Positive for confusion and dysphoric mood. The patient is nervous/anxious.   All other systems reviewed and  are negative.     Objective:   Physical Exam Gen: no distress, normal appearing HEENT: oral mucosa pink and moist, NCAT Cardio: Reg rate Chest: normal effort, normal rate of breathing Abd: soft, non-distended Ext: no edema Psych: pleasant, normal affect Skin: intact Neuro: Alert and oriented Musculoskeletal: 4/5 strength throughout    Assessment & Plan:  1) 1) CVA -continue therapies -would benefit from handicap placard to increase her mobility in the community -provided dietary and exercise counseling -discussed that hypertension is number one reversible risk factor for stroke.    2) HTN: -BP is 184/104today.  -increase clonidine to 0.2mg  BID -Advised checking BP daily at home and logging results to bring into follow-up appointment with her PCP and myself. -Reviewed BP meds today.  -Advised regarding healthy foods that can help lower blood pressure and provided with a list: 1) citrus foods- high in vitamins and minerals 2) salmon and other fatty fish - reduces inflammation and oxylipins 3) swiss chard (leafy green)- high level of nitrates 4) pumpkin seeds- one of the best natural sources of magnesium 5) Beans and lentils- high in fiber, magnesium, and potassium 6) Berries- high in flavonoids 7) Amaranth (whole grain, can be cooked similarly to rice and oats)- high in magnesium and fiber 8) Pistachios- even more effective at reducing BP than other nuts 9) Carrots- high in phenolic compounds that relax blood vessels and reduce inflammation 10) Celery- contain phthalides that relax tissues of arterial walls 11) Tomatoes- can also improve cholesterol and reduce risk of heart disease 12) Broccoli- good source of magnesium, calcium, and potassium 13) Greek yogurt: high in potassium and calcium 14) Herbs and spices: Celery seed, cilantro, saffron, lemongrass, black cumin, ginseng, cinnamon, cardamom, sweet basil, and ginger 15) Chia and flax seeds- also help to lower cholesterol  and blood sugar 16) Beets- high levels of nitrates that relax blood vessels  17) spinach and bananas- high in potassium  -Provided lise of supplements that can help with hypertension:  1) magnesium: one high quality brand is Bioptemizers since it contains all 7 types of magnesium, otherwise over the counter magnesium gluconate 400mg  is a good option 2) B vitamins 3) vitamin D 4) potassium 5) CoQ10 6) L-arginine 7) Vitamin C 8) Beetroot -Educated that goal BP is 120/80. -Made goal to incorporate some of the above foods into diet.  3) Constipation:  -discussed that GI bleeding is likely secondary to constipation as per GI, change protonix to PRN if not experiencing symptoms now that bowels are moving regularly.  -Provided list of following foods that help with constipation and highlighted a few: 1) prunes- contain high amounts of fiber.  2)  apples- has a form of dietary fiber called pectin that accelerates stool movement and increases beneficial gut bacteria 3) pears- in addition to fiber, also high in fructose and sorbitol which have laxative effect 4) figs- contain an enzyme ficin which helps to speed colonic transit 5) kiwis- contain an enzyme actinidin that improves gut motility and reduces constipation 6) oranges- rich in pectin (like apples) 7) grapefruits- contain a flavanol naringenin which has a laxative effect 8) vegetables- rich in fiber and also great sources of folate, vitamin C, and K 9) artichoke- high in inulin, prebiotic great for the microbiome 10) chicory- increases stool frequency and softness (can be added to coffee) 11) rhubarb- laxative effect 12) sweet potato- high fiber 13) beans, peas, and lentils- contain both soluble and insoluble fiber 14) chia seeds- improves intestinal health and gut flora 15) flaxseeds- laxative effect 16) whole grain rye bread- high in fiber 17) oat bran- high in soluble and insoluble fiber 18) kefir- softens stools -recommended to  try at least one of these foods every day.  -drink 6-8 glasses of water per day -walk regularly, especially after meals.

## 2021-01-30 NOTE — Patient Instructions (Addendum)
Constipation:  -Provided list of following foods that help with constipation and highlighted a few: 1) prunes- contain high amounts of fiber.  2) apples- has a form of dietary fiber called pectin that accelerates stool movement and increases beneficial gut bacteria 3) pears- in addition to fiber, also high in fructose and sorbitol which have laxative effect 4) figs- contain an enzyme ficin which helps to speed colonic transit 5) kiwis- contain an enzyme actinidin that improves gut motility and reduces constipation 6) oranges- rich in pectin (like apples) 7) grapefruits- contain a flavanol naringenin which has a laxative effect 8) vegetables- rich in fiber and also great sources of folate, vitamin C, and K 9) artichoke- high in inulin, prebiotic great for the microbiome 10) chicory- increases stool frequency and softness (can be added to coffee) 11) rhubarb- laxative effect 12) sweet potato- high fiber 13) beans, peas, and lentils- contain both soluble and insoluble fiber 14) chia seeds- improves intestinal health and gut flora 15) flaxseeds- laxative effect 16) whole grain rye bread- high in fiber 17) oat bran- high in soluble and insoluble fiber 18) kefir- softens stools -recommended to try at least one of these foods every day.  -drink 6-8 glasses of water per day -walk regularly, especially after meals.      HTN: -BP is ___today.  -Advised checking BP daily at home and logging results to bring into follow-up appointment with her PCP and myself. -Reviewed BP meds today.  -Advised regarding healthy foods that can help lower blood pressure and provided with a list: 1) citrus foods- high in vitamins and minerals 2) salmon and other fatty fish - reduces inflammation and oxylipins 3) swiss chard (leafy green)- high level of nitrates 4) pumpkin seeds- one of the best natural sources of magnesium 5) Beans and lentils- high in fiber, magnesium, and potassium 6) Berries- high in  flavonoids 7) Amaranth (whole grain, can be cooked similarly to rice and oats)- high in magnesium and fiber 8) Pistachios- even more effective at reducing BP than other nuts 9) Carrots- high in phenolic compounds that relax blood vessels and reduce inflammation 10) Celery- contain phthalides that relax tissues of arterial walls 11) Tomatoes- can also improve cholesterol and reduce risk of heart disease 12) Broccoli- good source of magnesium, calcium, and potassium 13) Greek yogurt: high in potassium and calcium 14) Herbs and spices: Celery seed, cilantro, saffron, lemongrass, black cumin, ginseng, cinnamon, cardamom, sweet basil, and ginger 15) Chia and flax seeds- also help to lower cholesterol and blood sugar 16) Beets- high levels of nitrates that relax blood vessels  17) spinach and bananas- high in potassium  -Provided lise of supplements that can help with hypertension:  1) magnesium: one high quality brand is Bioptemizers since it contains all 7 types of magnesium, otherwise over the counter magnesium gluconate 400mg  is a good option 2) B vitamins 3) vitamin D 4) potassium 5) CoQ10 6) L-arginine 7) Vitamin C 8) Beetroot -Educated that goal BP is 120/80. -Made goal to incorporate some of the above foods into diet.    Provided with list of supplements that can help with dyslipidemia: 1) Vitamin B3 500-4,000mg  in divided doses daily (would recommend starting low as can cause uncomfortable facial flushing if started at too high a dose) 2) Phytosterols 2.15 grams daily 3) Fermented soy 30-50 grams daily 4) EGCG (found in green tea): 500-1000mg  daily 5) Omega-3 fatty acids 3000-5,000mg  daily 6) Flax seed 40 grams daily 7) Monounsaturated fats 20-40 grams daily (olives, olive oil, nuts),  also reduces cardiovascular disease 8) Sesame: 40 grams daily 9) Gamma/delta tocotrienols- a family of unsaturated forms of Vitamin E- 200mg  with dinner 10) Pantethine 900mg  daily in divided  doses 11) Resveratrol 250mg  daily 12) N Acetyl Cysteine 2000mg  daily in divided doses 13) Curcumin 2000-5000mg  in divided doses daily 14) Pomegranate juice: 8 ounces daily, also helps to lower blood pressure 15) Pomegranate seeds one cup daily, also helps to lower blood pressure 16) Citrus Bergamot 1000mg  daily, also helps with glucose control and weight loss 17) Vitamin C 500mg  daily 18) Quercetin 500-1000mg  daily 19) Glutathione 20) Probiotics 60-100 billion organisms per day 21) Fiber 22) Oats 23) Aged garlic (can eat as food or supplement of 600-900mg  per day) 24) Chia seeds 25 grams per day 25) Lycopene- carotenoid found in high concentrations in tomatoes. 26) Alpha linolenic acid 27) Flavonoids and anthocyanins 28) Wogonin- flavanoid that enhances reverse cholesterol transport 29) Coenzyme Q10 30) Pantethine- derivative of Vitamin B5: 300mg  three times per day or 450mg  twice per day with or without food 31) Barley and other whole grains 32) Orange juice 33) L- carnitine 34) L- Lysine 35) L- Arginine 36) Almonds 37) Morin 38) Rutin 39) Carnosine 40) Histidine  41) Kaempferol  42) Organosulfur compounds 43) Vitamin E 44) Oleic acid 45) RBO (ferulic acid gammaoryzanol) 46) grape seed extract 47) Red wine 48) Berberine HCL 500mg  daily or twice per day- more effective and with fewer adverse effects that ezetimibe monotherapy 49) red yeast rice 2400- 4800 mg/day 50) chlorella 51) Licorice

## 2021-02-01 ENCOUNTER — Encounter (HOSPITAL_COMMUNITY): Payer: No Typology Code available for payment source

## 2021-02-01 ENCOUNTER — Ambulatory Visit: Payer: No Typology Code available for payment source

## 2021-02-01 DIAGNOSIS — G8320 Monoplegia of upper limb affecting unspecified side: Secondary | ICD-10-CM | POA: Insufficient documentation

## 2021-02-01 DIAGNOSIS — R32 Unspecified urinary incontinence: Secondary | ICD-10-CM | POA: Insufficient documentation

## 2021-02-01 DIAGNOSIS — I6350 Cerebral infarction due to unspecified occlusion or stenosis of unspecified cerebral artery: Secondary | ICD-10-CM | POA: Insufficient documentation

## 2021-02-01 DIAGNOSIS — R4781 Slurred speech: Secondary | ICD-10-CM | POA: Insufficient documentation

## 2021-02-01 DIAGNOSIS — Z9889 Other specified postprocedural states: Secondary | ICD-10-CM | POA: Insufficient documentation

## 2021-02-13 ENCOUNTER — Inpatient Hospital Stay: Payer: No Typology Code available for payment source | Admitting: Adult Health

## 2021-02-27 ENCOUNTER — Encounter: Payer: Self-pay | Admitting: Adult Health

## 2021-02-27 ENCOUNTER — Ambulatory Visit (INDEPENDENT_AMBULATORY_CARE_PROVIDER_SITE_OTHER): Payer: No Typology Code available for payment source | Admitting: Adult Health

## 2021-02-27 VITALS — BP 132/84 | HR 76 | Ht 68.0 in | Wt 198.0 lb

## 2021-02-27 DIAGNOSIS — I69351 Hemiplegia and hemiparesis following cerebral infarction affecting right dominant side: Secondary | ICD-10-CM

## 2021-02-27 DIAGNOSIS — I69322 Dysarthria following cerebral infarction: Secondary | ICD-10-CM | POA: Diagnosis not present

## 2021-02-27 DIAGNOSIS — I639 Cerebral infarction, unspecified: Secondary | ICD-10-CM | POA: Diagnosis not present

## 2021-02-27 DIAGNOSIS — I69398 Other sequelae of cerebral infarction: Secondary | ICD-10-CM

## 2021-02-27 DIAGNOSIS — R269 Unspecified abnormalities of gait and mobility: Secondary | ICD-10-CM

## 2021-02-27 DIAGNOSIS — E785 Hyperlipidemia, unspecified: Secondary | ICD-10-CM

## 2021-02-27 NOTE — Patient Instructions (Signed)
Contact neuro rehab end of this week or beginning of next to schedule initial evaluations   Continue clopidogrel 75 mg daily  and atorvastatin for secondary stroke prevention  Continue to follow up with PCP regarding cholesterol and blood pressure management  Maintain strict control of hypertension with blood pressure goal below 130/90 and cholesterol with LDL cholesterol (bad cholesterol) goal below 70 mg/dL.   Signs of a Stroke? Follow the BEFAST method:  Balance Watch for a sudden loss of balance, trouble with coordination or vertigo Eyes Is there a sudden loss of vision in one or both eyes? Or double vision?  Face: Ask the person to smile. Does one side of the face droop or is it numb?  Arms: Ask the person to raise both arms. Does one arm drift downward? Is there weakness or numbness of a leg? Speech: Ask the person to repeat a simple phrase. Does the speech sound slurred/strange? Is the person confused ? Time: If you observe any of these signs, call 911.     Followup in the future with me in 5 months or call earlier if needed       Thank you for coming to see [place] at [facility]. I hope we have been able to provide you high quality care today.  You may receive a patient satisfaction survey over the next few weeks. We would appreciate your feedback and comments so that we may continue to improve ourselves and the health of our patients.

## 2021-02-27 NOTE — Progress Notes (Signed)
[facility] [address] [place]. [place] [address] [phone]       HOSPITAL FOLLOW UP NOTE  [name] Date of Birth:  June 17, 1955 Medical Record Number:  [id number]   Reason for Referral:  hospital stroke follow up    SUBJECTIVE:   CHIEF COMPLAINT:  Chief Complaint  Patient presents with   Follow-up    Rm 3 with daughter here for hospital follow up- Reports he has been doing well has completed therapy but would like to discuss resuming this.    HPI:   Alec Sanchez is an 66 y.o. male with a PMHx of stroke, carotid artery occlusion, carotid artery stenosis s/p Right TCAR on 04/12/2020 by Dr. Donzetta Matters for asymptomatic high grade ICA stenosis, HLD and HTN who presented via EMS on 12/01/2020 for slurred speech and right facial droop but per note review, family reported patients first neurological deficits were noticed on 10/4 with facial weakness, slurred speech worse than baseline, AMS, loss of bowel and bladder, slowing of movement and speech and right hand weakness. ED exam noted generalized weakness in all extremities and slurred speech and hypertensive.  Personally reviewed hospitalization pertinent progress notes, lab work and imaging.  Evaluated by Dr. Leonie Man for 3 small acute ischemic infarcts involving the right anterior genu of the corpus callosum, periventricular white matter of the right posterior corona radiata and right lentiform nucleus likely due to small vessel disease.  CTA head/neck progressive severe left VA stenosis, chronic L ICA 60% stenosis, unchanged mild to moderate bilateral intracranial ICA stenosis and mild left M1 stenosis.  EF 60 to 65%.  LDL 144.  A1c 5.6.  Not compliant with aspirin and Plavix after prior stroke in 01/2020 - recommend DAPT for 3 weeks then aspirin alone.  Noncompliant with antihypertensive regimen stabilized during admission.  Noncompliant with atorvastatin -advised to resume at discharge. Hx of strooke 2021 - L BG/CR and  punctate left periatrial white matter infarcts.  Participated in sleep smart study with screen failure as negative for sleep apnea.  Therapy eval's recommended CIR for functional decline. Admitted from 10/12-10/27 - episode of hematochezia therefore aspirin held and remained on plavix alone - suspect in setting of stercoral ulceration - declined colonoscopy during hospitalization -advised follow-up outpatient.   Today, 02/27/2021, patient being seen for initial hospital follow-up accompanied by his daughter. Doing well since discharge. Reports residual right hand weakness, gait impairment and mild slurred speech. Has been improving. Completed HH therapies - interested in doing additional therapies. Use of cane - previously using only on occasion. [facility] to be [facility] for Rollator walker.  Lives alone.  Maintains ADLs independently.  Daughter assists with medications and cooking and transportation.  Denies new stroke/TIA symptoms. Compliant on Plavix and atorvastatin. No additional issues with bleeding. Blood pressure today 132/84. Monitors at home which has been stable. Has not had repeat cholesterol levels since hospitalization. Did have f/u with GI - plans on colonoscopy 6 mo post stroke. No further concerns at this time     PERTINENT IMAGING  Per recent hospitalization MRI brain: Three distinct 8 mm acute ischemic infarcts involving the right anterior genu of the corpus callosum, periventricular white matter of the right posterior corona radiata, and right lentiform nucleus . No associated hemorrhage or mass effect. Scattered T2/FLAIR signal abnormality involving the subcortical aspects of the right frontal and parietal regions as above, new as compared to prior MRI from 01/31/2020. 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 Also noted is underlying age-related cerebral atrophy with advanced chronic microvascular ischemic disease, progressed from  prior. 3. CTA of head and neck: No emergent large vessel occlusion. Interval right carotid stenting without residual stenosis. 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 Progressive, severe proximal left vertebral artery stenosis. Unchanged 60% proximal left ICA stenosis. Unchanged mild to moderate bilateral intracranial ICA stenoses and mild left M1 stenosis. Aortic Atherosclerosisode Stroke CT head  2D Echo ejection fraction 60 to 65%.  No cardiac source of embolism. LDL 144 HgbA1c 5.6        ROS:   14 system review of systems performed and negative with exception of those listed in HPI  PMH:  Past Medical History:  Diagnosis Date   Anxiety    Carotid artery occlusion    Depression    Hyperlipidemia    Hypertension    Stroke [facility])     PSH:  Past Surgical History:  Procedure Laterality Date   ANKLE SURGERY Left    TRANSCAROTID ARTERY REVASCULARIZATION  Right 04/12/2020   Procedure: RIGHT TRANSCAROTID ARTERY REVASCULARIZATION;  Surgeon: Waynetta Sandy, MD;  Location: [place];  Service: Vascular;  Laterality: Right;   ULTRASOUND GUIDANCE FOR VASCULAR ACCESS Left 04/12/2020   Procedure: ULTRASOUND GUIDANCE FOR VASCULAR ACCESS, left femoral vein;  Surgeon: Waynetta Sandy, MD;  Location: [place];  Service: Vascular;  Laterality: Left;    Social History:  Social History   Socioeconomic History   Marital status: Widowed    Spouse name: Not on file   Number of children: Not on file   Years of education: Not on file   Highest education level: Not on file  Occupational History   Not on file  Tobacco Use   Smoking status: Never   Smokeless tobacco: Never  Vaping Use   Vaping Use: Never used  Substance and Sexual Activity   Alcohol use: Not Currently   Drug use: Never   Sexual activity: Not on file  Other Topics Concern   Not on file  Social History Narrative   Not on file   Social Determinants of  Health   Financial Resource Strain: Not on file  Food Insecurity: Not on file  Transportation Needs: Not on file  Physical Activity: Not on file  Stress: Not on file  Social Connections: Not on file  Intimate Partner Violence: Not on file    Family History:  Family History  Problem Relation Age of Onset   Heart failure Mother    Stroke Mother    Stroke Father    Hypertension Father    Diabetes Father    GER disease Cousin     Medications:   Current Outpatient Medications on File Prior to Visit  Medication Sig Dispense Refill   acetaminophen (TYLENOL) 325 MG tablet Take 2 tablets (650 mg total) by mouth every 6 (six) hours as needed for mild pain (or Fever >/= 101).     amLODipine (NORVASC) 10 MG tablet Take 1 tablet (10 mg total) by mouth every morning. 30 tablet 0   atorvastatin (LIPITOR) 80 MG tablet Take 1 tablet (80 mg total) by mouth at bedtime. 30 tablet 0   Cholecalciferol 25 MCG (1000 UT) tablet Take 1 tablet (1,000 Units total) by mouth every morning. 30 tablet 0   cloNIDine (CATAPRES) 0.1 MG tablet Take 2 tablets (0.2 mg total) by mouth 2 (two) times daily. 60 tablet 0   clopidogrel (PLAVIX) 75 MG tablet  Take 75 mg by mouth daily.     docusate sodium (COLACE) 100 MG capsule Take 1 capsule (100 mg total) by mouth daily. 10 capsule 0   ferrous sulfate 325 (65 FE) MG tablet Take 1 tablet (325 mg total) by mouth every Monday, Wednesday, and Friday. 30 tablet 0   hydrALAZINE (APRESOLINE) 10 MG tablet Take 1 tablet (10 mg total) by mouth every 8 (eight) hours. 90 tablet 0   lidocaine (LIDODERM) 5 % Place 1 patch onto the skin daily. Remove & Discard patch within 12 hours or as directed by MD 30 patch 0   lisinopril (ZESTRIL) 40 MG tablet Take 1 tablet by mouth every morning.     pantoprazole (PROTONIX) 40 MG tablet Take 1 tablet (40 mg total) by mouth daily. 30 tablet 0   PARoxetine (PAXIL) 20 MG tablet Take 1 tablet (20 mg total) by mouth daily. 30 tablet 0   polyethylene  glycol (MIRALAX / GLYCOLAX) 17 g packet Take 17 g by mouth 2 (two) times daily. 14 each 0   No current facility-administered medications on file prior to visit.    Allergies:   Allergies  Allergen Reactions   Trazodone Nausea And Vomiting      OBJECTIVE:  Physical Exam  Vitals:   02/27/21 1346  BP: 132/84  Pulse: 76  SpO2: 96%  Weight: 198 lb (89.8 kg)  Height: 5\' 8"  (1.727 m)   Body mass index is 30.11 kg/m. No results found.  General: well developed, well nourished, very pleasant elderly African-American male, seated, in no evident distress Head: head normocephalic and atraumatic.   Neck: supple with no carotid or supraclavicular bruits Cardiovascular: regular rate and rhythm, no murmurs Musculoskeletal: no deformity Skin:  no rash/petichiae Vascular:  Normal pulses all extremities   Neurologic Exam Mental Status: Awake and fully alert.  Mild to moderate dysarthria.  No evidence of aphasia.  Oriented to place and time. Recent and remote memory intact. Attention span, concentration and fund of knowledge appropriate during visit. Mood and affect appropriate.  Cranial Nerves: Fundoscopic exam reveals sharp disc margins. Pupils equal, briskly reactive to light. Extraocular movements full without nystagmus. Visual fields full to confrontation. Hearing intact. Facial sensation intact.  Right lower facial weakness.  Tongue, palate moves normally and symmetrically.  Motor: Normal strength, bulk and tone left upper and lower extremity RUE: 4+/5 with decreased grip strength and hand dexterity RLE: 4+-5/5 Sensory.: intact to touch , pinprick , position and vibratory sensation.  Coordination: Rapid alternating movements normal in all extremities except decreased right hand. Finger-to-nose and heel-to-shin mild right-sided ataxia, normal on left Gait and Station: Arises from chair without difficulty. Stance is wide-based.  Ataxic gait with mild unsteadiness/imbalance with use of  cane.  Tandem walking heel toe not attempted Reflexes: 1+ and symmetric. Toes downgoing.     NIHSS  4 Modified Rankin  2-3      ASSESSMENT: Alec Sanchez is a 66 y.o. year old male with recent infarcts involving right anterior genu of corpus callosum, periventricular white matter of the right posterior corona radiata and right lentiform nucleus on 12/01/2020 likely due to small vessel disease. Vascular risk factors include prior strokes, HTN, HLD, intracranial stenosis, R ICA stenosis s/p R TCAR 03/2020, and medication noncompliance.      PLAN:  Recurrent strokes :  Residual deficit: right sided ataxia, gait abnormality and dysarthria. Referred to neuro rehab PT/OT/SLP for ongoing therapy needs. Use of AD at all times unless otherwise instructed Continue clopidogrel  75 mg daily  and atorvastatin 80 mg daily for secondary stroke prevention.  Medications managed and prescribed by PCP/[facility] Discussed importance of medication compliance to help reduce risk of recurrent strokes.   Discussed secondary stroke prevention measures and importance of close PCP follow up for aggressive stroke risk factor management. I have gone over the pathophysiology of stroke, warning signs and symptoms, risk factors and their management in some detail with instructions to go to the closest emergency room for symptoms of concern. HTN: BP goal <130/90.  Stable on current regimen per PCP HLD: LDL goal <70. Recent LDL 144.  Continue atorvastatin 80 mg daily. Repeat lipid panel today.  Carotid stenosis: followed by Dr. Donzetta Matters     Follow up in 5 months or call earlier if needed   CC:  [place] provider: Dr. Leonie Man PCP: [facility]    I spent 57 minutes of face-to-face and non-face-to-face time with patient and daughter.  This included previsit chart review including review of recent hospitalization, lab review, study review, order entry, electronic health record documentation, patient and daughter education and  discussion regarding recent stroke including etiology, secondary stroke prevention measures and importance of managing stroke risk factors, residual deficits and typical recovery time and answered all other questions to patient and daughters satisfaction  Frann Rider, AGNP-BC  [facility] [address] [place], [place] [address]  Phone [phone] Fax [phone] Note: This document was prepared with digital dictation and possible smart phrase technology. Any transcriptional errors that result from this process are unintentional.

## 2021-02-28 ENCOUNTER — Telehealth: Payer: Self-pay | Admitting: *Deleted

## 2021-02-28 ENCOUNTER — Other Ambulatory Visit: Payer: Self-pay | Admitting: Adult Health

## 2021-02-28 LAB — LIPID PANEL
Chol/HDL Ratio: 4.2 ratio (ref 0.0–5.0)
Cholesterol, Total: 168 mg/dL (ref 100–199)
HDL: 40 mg/dL (ref >39)
LDL Chol Calc (NIH): 112 mg/dL — ABNORMAL HIGH (ref 0–99)
Triglycerides: 86 mg/dL (ref 0–149)
VLDL Cholesterol Cal: 16 mg/dL (ref 5–40)

## 2021-02-28 MED ORDER — ROSUVASTATIN CALCIUM 40 MG PO TABS: 40.0000 mg | ORAL_TABLET | Freq: Every day | ORAL | 5 refills | Status: DC

## 2021-02-28 NOTE — Progress Notes (Signed)
I agree with the above plan 

## 2021-02-28 NOTE — Telephone Encounter (Signed)
Spoke with patient and informed him that recent labs show cholesterol levels remain elevated. NP recommends switching to Crestor 40 mg daily and has sent in a new Rx. Advised he needs to repeat cholesterol levels at next follow-up with PCP. He  verbalized understanding, appreciation.

## 2021-03-02 ENCOUNTER — Ambulatory Visit: Payer: No Typology Code available for payment source | Attending: Adult Health | Admitting: Physical Therapy

## 2021-03-02 ENCOUNTER — Ambulatory Visit: Payer: No Typology Code available for payment source | Admitting: Occupational Therapy

## 2021-03-02 ENCOUNTER — Other Ambulatory Visit: Payer: Self-pay

## 2021-03-02 ENCOUNTER — Encounter: Payer: Self-pay | Admitting: Occupational Therapy

## 2021-03-02 ENCOUNTER — Ambulatory Visit: Payer: No Typology Code available for payment source

## 2021-03-02 ENCOUNTER — Encounter: Payer: Self-pay | Admitting: Physical Therapy

## 2021-03-02 VITALS — BP 134/70 | HR 72

## 2021-03-02 DIAGNOSIS — R471 Dysarthria and anarthria: Secondary | ICD-10-CM | POA: Insufficient documentation

## 2021-03-02 DIAGNOSIS — R278 Other lack of coordination: Secondary | ICD-10-CM

## 2021-03-02 DIAGNOSIS — I639 Cerebral infarction, unspecified: Secondary | ICD-10-CM | POA: Insufficient documentation

## 2021-03-02 DIAGNOSIS — R2681 Unsteadiness on feet: Secondary | ICD-10-CM | POA: Insufficient documentation

## 2021-03-02 DIAGNOSIS — R2689 Other abnormalities of gait and mobility: Secondary | ICD-10-CM | POA: Diagnosis present

## 2021-03-02 DIAGNOSIS — M6281 Muscle weakness (generalized): Secondary | ICD-10-CM | POA: Insufficient documentation

## 2021-03-02 DIAGNOSIS — R269 Unspecified abnormalities of gait and mobility: Secondary | ICD-10-CM | POA: Insufficient documentation

## 2021-03-02 DIAGNOSIS — I69351 Hemiplegia and hemiparesis following cerebral infarction affecting right dominant side: Secondary | ICD-10-CM | POA: Diagnosis not present

## 2021-03-02 DIAGNOSIS — I69398 Other sequelae of cerebral infarction: Secondary | ICD-10-CM | POA: Insufficient documentation

## 2021-03-02 NOTE — Therapy (Signed)
[facility] [address] [place], [place], [address] Phone: [phone]   Fax:  [phone]  Occupational Therapy Evaluation   Patient Details  Name: Alec Sanchez MRN: [id number] Date of Birth: 1955-11-22 No data recorded  Encounter Date: 03/02/2021   OT End of Session - 03/02/21 1429     Visit Number 1    OT Start Time 1400    OT Stop Time 1423    OT Time Calculation (min) 23 min    Activity Tolerance Patient tolerated treatment well    Behavior During Therapy [facility] for tasks assessed/performed             Past Medical History:  Diagnosis Date   Anxiety    Carotid artery occlusion    Depression    Hyperlipidemia    Hypertension    Stroke [facility])     Past Surgical History:  Procedure Laterality Date   ANKLE SURGERY Left    TRANSCAROTID ARTERY REVASCULARIZATION  Right 04/12/2020   Procedure: RIGHT TRANSCAROTID ARTERY REVASCULARIZATION;  Surgeon: Maeola Harman, MD;  Location: [facility] OR;  Service: Vascular;  Laterality: Right;   ULTRASOUND GUIDANCE FOR VASCULAR ACCESS Left 04/12/2020   Procedure: ULTRASOUND GUIDANCE FOR VASCULAR ACCESS, left femoral vein;  Surgeon: Maeola Harman, MD;  Location: [facility] OR;  Service: Vascular;  Laterality: Left;    There were no vitals filed for this visit.   Subjective Assessment - 03/02/21 1416     Subjective  Pt is a 66 year old male that presents to Neuro OPOT with recurrent strokes and residual R sided weakness from CVA. Pt reports that things are going well and he does not think he really needs occupational therapy. Pt presents with some RUE grip strength deficits and decreased coordination, bilaterally, however is independent with ADLs and IADLs and is currently living independently in an apartment and driving community distances.    Limitations Fall Risk    Patient Stated Goals "i don't know, my daughter wanted me to come"    Currently in Pain? No/denies                [facility] OT Assessment - 03/02/21 1405       Assessment   Medical Diagnosis R CVA   recurrent strokes   Onset Date/Surgical Date 02/27/21   referral date   Hand Dominance Right    Prior Therapy inpatient rehab, [facility]      Precautions   Precautions Fall      Home  Environment   Family/patient expects to be discharged to: Private residence    Living Arrangements Alone    Type of Home Aartment    Bathroom Shower/Tub Walk-in Shower    Home Equipment Shower seat - built in;Cane - single point      Prior Function   Level of Independence Independent    Vocation On disability    Leisure playing pool      ADL   Eating/Feeding Modified independent    Grooming Independent    Upper Body Bathing Modified independent    Lower Body Bathing Modified independent    Upper Body Dressing Independent    Lower Development worker, community - Child psychotherapist Modified independent      IADL   Shopping Takes care of all shopping needs independently    Light Housekeeping Does personal laundry completely    Meal Prep Does not  utilize stove or Immunologistoven    Community Mobility Drives own vehicle    Medication Management Is responsible for taking medication in correct dosages at correct time    Development worker, communityinancial Management Manages financial matters independently (budgets, writes checks, pays rent, bills goes to [employer]), collects and keeps track of income      Written Expression   Dominant Hand Right      Vision - History   Baseline Vision Wears glasses only for reading      Cognition   Overall Cognitive Status No family/caregiver present to determine baseline cognitive functioning      Observation/Other Assessments   Focus on Therapeutic Outcomes (FOTO)  N/A not acute      Coordination   9 Hole Peg Test Right;Left    Right 9 Hole Peg Test 47.41s   above norm   Left 9 Hole Peg Test 42.03s   above norm     ROM  / Strength   AROM / PROM / Strength AROM;Strength      AROM   Overall AROM  Within functional limits for tasks performed      Strength   Overall Strength Within functional limits for tasks performed      Hand Function   Right Hand Gross Grasp Functional    Right Hand Grip (lbs) 42.9    Left Hand Gross Grasp Functional    Left Hand Grip (lbs) 50.9                                          Plan - 03/02/21 1429     Clinical Impression Statement Pt is a 66 year old male that presents to Neuro OPOT with recurrent strokes and residual R sided weakness from CVA. Pt reports that things are going well and he does not think he really needs occupational therapy. Pt presents with some RUE grip strength deficits and decreased coordination, bilaterally, however is independent with ADLs and IADLs and is currently living independently in an apartment and driving community distances. Skilled occupational therapy is not recommended at this time d/t patient living independently and completing all functional tasks with no difficulty, per report.    OT Occupational Profile and History Problem Focused Assessment - Including review of records relating to presenting problem    Body Structure / Function / Physical Skills Coordination    Clinical Decision Making Limited treatment options, no task modification necessary    Comorbidities Affecting Occupational Performance: None    Modification or Assistance to Complete Evaluation  No modification of tasks or assist necessary to complete eval    OT Frequency One time visit    Plan evaluation only    Consulted and Agree with Plan of Care Patient             Patient will benefit from skilled therapeutic intervention in order to improve the following deficits and impairments:   Body Structure / Function / Physical Skills: Coordination       Visit Diagnosis: Muscle weakness (generalized)  Other lack of  coordination    Problem List Patient Active Problem List   Diagnosis Date Noted   Monoparesis of arm (HCC) 02/01/2021   History of carotid endarterectomy 02/01/2021   Slurred speech 02/01/2021   Urinary incontinence 02/01/2021   Cerebral infarction due to unspecified occlusion or stenosis of unspecified cerebral artery (HCC) 02/01/2021   GIB (gastrointestinal bleeding) 12/12/2020  Transaminitis    Essential hypertension    Labile blood pressure    Slow transit constipation    Ischemic cerebrovascular accident (CVA) of frontal lobe (HCC) 12/06/2020   Dyslipidemia    Benign essential HTN    Abnormal finding on GI tract imaging    AKI (acute kidney injury) (HCC) 12/01/2020   Vitamin D deficiency 04/26/2020   Thrombocytosis 04/26/2020   Prediabetes 04/26/2020   Pain in left knee 04/26/2020   Other ill-defined and unknown causes of morbidity and mortality 04/26/2020   Cocaine abuse (HCC) 04/26/2020   Alcohol abuse 04/26/2020   Nonspecific abnormal findings on radiological and examination of lung field 04/26/2020   Multiple pulmonary nodules 04/26/2020   Leukocytosis 04/26/2020   Iron deficiency 04/26/2020   Hyperlipidemia 04/26/2020   Depression 04/26/2020   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   Closed fracture of lateral malleolus 04/26/2020   Cannabis abuse 04/26/2020   Ankle pain 04/26/2020   Tobacco use disorder 04/26/2020   Pain in joint of right shoulder 04/26/2020   Low back pain 04/26/2020   Hypertension 04/26/2020   Erectile dysfunction 04/26/2020   Dental abscess 04/26/2020   CKD (chronic kidney disease) 04/26/2020   Carotid stenosis 04/12/2020   Acute ischemic stroke [facility]) 01/31/2020    Junious Dresser, OT 03/02/2021, 2:33 PM  [facility] [address] [place], [place], [address] Phone: [phone]   Fax:  [phone]  Name: Alec Sanchez MRN: [id number] Date of Birth: Jun 26, 1955

## 2021-03-02 NOTE — Therapy (Signed)
[facility] [address] [place], [place], [address] Phone: [phone]   Fax:  [phone]  Patient Details  Name: Alec Sanchez MRN: [id number] Date of Birth: 1955/11/07 Referring Provider:  Ihor Austin, NP  Encounter Date: 03/02/2021    ST - Arrive-Cancel  SLP took pt back to ST room for evaluation and began interview process for evaluation and pt reported his speech was at baseline. SLP asked pt again x2, re-wording the question each time and pt again stated his speech sounds today as it did just prior to his CVA 11-30-20.  SLP gave pt the option of continuing eval for SLP to assess pt to ascertain if speech is at baseline, or cancel evaluation and pt chose to cancel evaluation.  SLP told pt he would need to obtain another script if he would like to return for ST. Pt acknowledged understanding.   [facility], CCC-SLP 03/02/2021, 2:46 PM  [facility] [address] [place], [place], [address] Phone: [phone]   Fax:  [phone]

## 2021-03-02 NOTE — Therapy (Signed)
[facility] [address] [place], [place], [address] Phone: [phone]   Fax:  [phone]  Physical Therapy Evaluation  Patient Details  Name: Alec Sanchez MRN: [id number] Date of Birth: Dec 21, 1955 Referring Provider (PT): Ihor Austin, NP   Encounter Date: 03/02/2021   PT End of Session - 03/02/21 1450     Visit Number 1    Number of Visits 9    Date for PT Re-Evaluation 05/01/21    Authorization Type UHC  - pt reports that he has [facility] auth (pt to reach out to them)    PT Start Time 1318    PT Stop Time 1358    PT Time Calculation (min) 40 min    Equipment Utilized During Treatment Gait belt    Activity Tolerance Patient tolerated treatment well    Behavior During Therapy [facility] for tasks assessed/performed             Past Medical History:  Diagnosis Date   Anxiety    Carotid artery occlusion    Depression    Hyperlipidemia    Hypertension    Stroke [facility])     Past Surgical History:  Procedure Laterality Date   ANKLE SURGERY Left    TRANSCAROTID ARTERY REVASCULARIZATION  Right 04/12/2020   Procedure: RIGHT TRANSCAROTID ARTERY REVASCULARIZATION;  Surgeon: Maeola Harman, MD;  Location: [facility] OR;  Service: Vascular;  Laterality: Right;   ULTRASOUND GUIDANCE FOR VASCULAR ACCESS Left 04/12/2020   Procedure: ULTRASOUND GUIDANCE FOR VASCULAR ACCESS, left femoral vein;  Surgeon: Maeola Harman, MD;  Location: [facility] OR;  Service: Vascular;  Laterality: Left;    Vitals:   03/02/21 1324  BP: 134/70  Pulse: 72      Subjective Assessment - 03/02/21 1325     Subjective Presented 10/6 with c/o drooping of face, slurred speech, AMS, weakness, and incontinence x2 days with reports of x2 falls. 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 Received inpatient rehab in hospital for PT/OT/ST and discharged home 12/21/20. Received home  health therapies - ended about 2 weeks ago. Previous hx of CVA with R sided deficits. Holds cane, but sometimes does not use it. No falls. Feels like he is back at his basline after his recent strokes.    Pertinent History PT GOES BY [facility]. PMH includes: previous CVA with R-sided deficits (2020) , HTN, and HLD, CKD.    Limitations Walking    Patient Stated Goals To walk better.    Currently in Pain? No/denies                [facility] PT Assessment - 03/02/21 1331       Assessment   Medical Diagnosis R CVA   hx of CVA in 2020 with R residual deficits   Referring Provider (PT) Ihor Austin, NP    Onset Date/Surgical Date 02/27/21   date of referral   Hand Dominance Right    Prior Therapy Inpatient rehab, [facility] therapies.      Precautions   Precautions Fall      Balance Screen   Has the patient fallen in the past 6 months No    Has the patient had a decrease in activity level because of a fear of falling?  Yes    Is the patient reluctant to leave their home because of a fear of falling?  No      Home Environment   Living  Environment Private residence    Living Arrangements Alone    Available Help at Discharge --   has family nearby   Type of Home Apartment    Home Access Elevator    Home Layout One level    Home Equipment Omroane - single point;Walker - 2 wheels;Grab bars - tub/shower;Grab bars - toilet      Prior Function   Level of Independence Independent    Vocation On disability    Leisure Playing pool      Sensation   Light Touch Appears Intact      Coordination   Gross Motor Movements are Fluid and Coordinated Yes      ROM / Strength   AROM / PROM / Strength Strength      Strength   Overall Strength Deficits    Strength Assessment Site Hip;Knee;Ankle    Right/Left Hip Right;Left    Right Hip Flexion 4+/5    Left Hip Flexion 4+/5    Right/Left Knee Right;Left    Right Knee Flexion 5/5    Right Knee Extension 5/5    Left Knee Flexion 5/5    Left Knee  Extension 5/5    Right/Left Ankle Right;Left    Right Ankle Dorsiflexion 5/5    Left Ankle Dorsiflexion 5/5      Transfers   Transfers Sit to Stand;Stand to Sit    Sit to Stand 5: Supervision    Five time sit to stand comments  17.96 seconds with no UE support, wider BOS    Stand to Sit 5: Supervision      Ambulation/Gait   Ambulation/Gait Yes    Ambulation/Gait Assistance 5: Supervision    Ambulation/Gait Assistance Details Ambulated into clinic with SPC, pt reports normally using no AD    Assistive device None    Gait Pattern Decreased arm swing - left;Decreased arm swing - right;Decreased stance time - right;Decreased dorsiflexion - right;Poor foot clearance - right;Step-through pattern   R foot scuffing   Ambulation Surface Level;Indoor    Gait velocity 16.62 seconds = 1.97 ft/sec   no AD   Stairs Yes    Stairs Assistance 5: Supervision    Stairs Assistance Details (indicate cue type and reason) Ascends with alternating pattern with no rails, descends with step to pattern and bilat handrails    Number of Stairs 4    Height of Stairs 6      Standardized Balance Assessment   Standardized Balance Assessment Timed Up and Go Test      Timed Up and Go Test   Normal TUG (seconds) 13.03    TUG Comments no AD      Functional Gait  Assessment   Gait assessed  Yes    Gait Level Surface Walks 20 ft, slow speed, abnormal gait pattern, evidence for imbalance or deviates 10-15 in outside of the 12 in walkway width. Requires more than 7 sec to ambulate 20 ft.   10.97 seconds   Change in Gait Speed Able to change speed, demonstrates mild gait deviations, deviates 6-10 in outside of the 12 in walkway width, or no gait deviations, unable to achieve a major change in velocity, or uses a change in velocity, or uses an assistive device.    Gait with Horizontal Head Turns Performs head turns smoothly with slight change in gait velocity (eg, minor disruption to smooth gait path), deviates 6-10 in  outside 12 in walkway width, or uses an assistive device.    Gait with Vertical Head  Turns Performs task with slight change in gait velocity (eg, minor disruption to smooth gait path), deviates 6 - 10 in outside 12 in walkway width or uses assistive device    Gait and Pivot Turn Pivot turns safely within 3 sec and stops quickly with no loss of balance.    Step Over Obstacle Is able to step over 2 stacked shoe boxes taped together (9 in total height) without changing gait speed. No evidence of imbalance.    Gait with Narrow Base of Support Ambulates less than 4 steps heel to toe or cannot perform without assistance.    Gait with Eyes Closed Walks 20 ft, slow speed, abnormal gait pattern, evidence for imbalance, deviates 10-15 in outside 12 in walkway width. Requires more than 9 sec to ambulate 20 ft.   13.42   Ambulating Backwards Walks 20 ft, slow speed, abnormal gait pattern, evidence for imbalance, deviates 10-15 in outside 12 in walkway width.   20.84 seconds   Steps Two feet to a stair, must use rail.    Total Score 16    FGA comment: 16/30 = high fall risk                        Objective measurements completed on examination: See above findings.                PT Education - 03/02/21 1357     Education Details Clinical findings, POC    Person(s) Educated Patient    Methods Explanation    Comprehension Verbalized understanding              PT Short Term Goals - 03/02/21 1452       PT SHORT TERM GOAL #1   Title ALL STGS= LTGS               PT Long Term Goals - 03/02/21 1452       PT LONG TERM GOAL #1   Title Pt will be independent with final HEP in order to build upon functional gains made in therapy. ALL LTGS DUE 03/30/21    Time 4    Period Weeks    Status New    Target Date 03/30/21      PT LONG TERM GOAL #2   Title Pt will improve FGA score to at least a 20/30 in order to demo decr fall risk.    Time 4    Period Weeks    Status  New      PT LONG TERM GOAL #3   Title Pt will undergo assessment of R foot up brace vs. AFO for improved foot clearance when RLE is more fatigued.    Time 4    Period Weeks    Status New      PT LONG TERM GOAL #4   Title Pt will improve 5x sit <> stand time to 15 seconds or less in order to demo improved functional BLE strength.    Baseline 17.96 seconds, no UE support    Time 4    Period Weeks    Status New      PT LONG TERM GOAL #5   Title Pt will improve gait speed to at least 2.4 ft/sec with no AD vs. LRAD in order to demo improved gait efficiency.    Baseline 1.97 ft/sec no AD    Time 4    Period Weeks    Status New  Plan - 03/02/21 1455     Clinical Impression Statement Patient is a 66 year old male referred to Neuro OPPT for  recent infarcts involving right anterior genu of corpus callosum, periventricular white matter of the right posterior corona radiata and right lentiform nucleus on 12/01/2020.   Pt's PMH is significant [id number]:previous CVA with R-sided deficits (2020) , HTN, HLD, CKD, cocaine/alcohol abuse. The following deficits were present during the exam: impaired strength, gait abnormalities, balance impairments, RLE foot drag/decr foot clearance when more fatigued. Based on FGA and 5x sit <> stand, pt is an incr risk for falls. Pt's gait speed with no AD indicates that pt is a limited community ambulator. Pt would benefit from skilled PT to address these impairments and functional limitations to maximize functional mobility independence and decr fall risk.    Personal Factors and Comorbidities Comorbidity 3+;Past/Current Experience;Time since onset of injury/illness/exacerbation    Comorbidities PMH includes: previous CVA with R-sided deficits (2020) , HTN, HLD, CKD, cocaine/alcohol abuse    Examination-Activity Limitations Stairs;Locomotion Level    Examination-Participation Restrictions Community Activity    Stability/Clinical Decision Making  Stable/Uncomplicated    Clinical Decision Making Low    Rehab Potential Good    PT Frequency 2x / week    PT Duration 8 weeks    PT Treatment/Interventions ADLs/Self Care Home Management;Gait training;Stair training;Functional mobility training;Therapeutic activities;Neuromuscular re-education;Balance training;Therapeutic exercise;Patient/family education;Passive range of motion;Vestibular    PT Next Visit Plan Initial HEP for functional strength/balance - eyes closed, SLS, narrow BOS, unlevel surfaces. Trial use of foot up brace vs. AFO to RLE for incr foot clearance when fatigued.    Consulted and Agree with Plan of Care Patient             Patient will benefit from skilled therapeutic intervention in order to improve the following deficits and impairments:  Abnormal gait, Decreased activity tolerance, Decreased balance, Decreased endurance, Decreased strength  Visit Diagnosis: Unsteadiness on feet  Other abnormalities of gait and mobility  Muscle weakness (generalized)     Problem List Patient Active Problem List   Diagnosis Date Noted   Monoparesis of arm (HCC) 02/01/2021   History of carotid endarterectomy 02/01/2021   Slurred speech 02/01/2021   Urinary incontinence 02/01/2021   Cerebral infarction due to unspecified occlusion or stenosis of unspecified cerebral artery (HCC) 02/01/2021   GIB (gastrointestinal bleeding) 12/12/2020   Transaminitis    Essential hypertension    Labile blood pressure    Slow transit constipation    Ischemic cerebrovascular accident (CVA) of frontal lobe (HCC) 12/06/2020   Dyslipidemia    Benign essential HTN    Abnormal finding on GI tract imaging    AKI (acute kidney injury) (HCC) 12/01/2020   Vitamin D deficiency 04/26/2020   Thrombocytosis 04/26/2020   Prediabetes 04/26/2020   Pain in left knee 04/26/2020   Other ill-defined and unknown causes of morbidity and mortality 04/26/2020   Cocaine abuse (HCC) 04/26/2020   Alcohol abuse  04/26/2020   Nonspecific abnormal findings on radiological and examination of lung field 04/26/2020   Multiple pulmonary nodules 04/26/2020   Leukocytosis 04/26/2020   Iron deficiency 04/26/2020   Hyperlipidemia 04/26/2020   Depression 04/26/2020   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   Closed fracture of lateral malleolus 04/26/2020   Cannabis abuse 04/26/2020   Ankle pain 04/26/2020   Tobacco use disorder 04/26/2020   Pain in joint of right shoulder 04/26/2020   Low back pain 04/26/2020   Hypertension 04/26/2020   Erectile  dysfunction 04/26/2020   Dental abscess 04/26/2020   CKD (chronic kidney disease) 04/26/2020   Carotid stenosis 04/12/2020   Acute ischemic stroke (HCC) 01/31/2020    Drake Leach, PT, DPT 03/02/2021, 2:59 PM  [facility] [address] [place], [place], [address] Phone: [phone]   Fax:  [phone]  Name: Alec Sanchez MRN: [id number] Date of Birth: July 29, 1955

## 2021-03-23 ENCOUNTER — Other Ambulatory Visit: Payer: Self-pay

## 2021-03-23 ENCOUNTER — Ambulatory Visit: Payer: No Typology Code available for payment source | Admitting: Physical Therapy

## 2021-03-26 ENCOUNTER — Ambulatory Visit: Payer: No Typology Code available for payment source | Admitting: Physical Therapy

## 2021-03-28 ENCOUNTER — Other Ambulatory Visit: Payer: Self-pay

## 2021-03-28 ENCOUNTER — Ambulatory Visit (INDEPENDENT_AMBULATORY_CARE_PROVIDER_SITE_OTHER): Payer: No Typology Code available for payment source | Admitting: Physician Assistant

## 2021-03-28 ENCOUNTER — Encounter: Payer: Self-pay | Admitting: Physician Assistant

## 2021-03-28 ENCOUNTER — Ambulatory Visit (HOSPITAL_COMMUNITY)
Admission: RE | Admit: 2021-03-28 | Discharge: 2021-03-28 | Disposition: A | Discharge status: Home / Self Care | Payer: No Typology Code available for payment source | Source: Ambulatory Visit | Attending: Vascular Surgery | Admitting: Vascular Surgery

## 2021-03-28 VITALS — BP 132/81 | HR 71 | Temp 97.8°F | Resp 20 | Ht 68.0 in | Wt 197.8 lb

## 2021-03-28 DIAGNOSIS — I70219 Atherosclerosis of native arteries of extremities with intermittent claudication, unspecified extremity: Secondary | ICD-10-CM

## 2021-03-28 DIAGNOSIS — I6523 Occlusion and stenosis of bilateral carotid arteries: Secondary | ICD-10-CM | POA: Diagnosis not present

## 2021-03-28 DIAGNOSIS — Z95828 Presence of other vascular implants and grafts: Secondary | ICD-10-CM

## 2021-03-28 NOTE — Progress Notes (Signed)
Office Note     CC:  follow up Requesting Provider:  Clinic, [facility]  HPI: Alec Sanchez is a 66 y.o. ([birth year]) male who presents  for follow up of carotid artery stenosis. He is s/p right TCAR on 04/12/20 by Dr. Randie Heinz for asymptomatic high grade ICA stenosis. Left ICA stenosis is known to be 40-59%.   He has had no new neurological symptoms since his last visit in March of 2022. He does have history of CVA in 2020 with residual right arm and hand weakness and slurred speech but this has improved significantly. He is still in Speech and Physical Therapy. He denies any amaurosis fugax, new speech difficulties, unilateral upper or lower extremity weakness or numbness. He is compliant with his Aspirin, statin and Plavix.   Patient reports claudication symptoms in RLE. Says he can go about 150 yards before he has to stop and rest. Pain will go away immediately and then will return on continued ambulation. He says this rarely interferes with is day to day activities. He denies any pain at rest or non healing wounds  The pt is on a statin for cholesterol management.  The pt is on a daily aspirin.   Other AC:  Plavix The pt is on ACE, CCB, Hydralazine for hypertension.   The pt is not diabetic.   Tobacco hx:  never  Past Medical History:  Diagnosis Date   Anxiety    Carotid artery occlusion    Depression    Hyperlipidemia    Hypertension    Stroke [facility])     Past Surgical History:  Procedure Laterality Date   ANKLE SURGERY Left    TRANSCAROTID ARTERY REVASCULARIZATION  Right 04/12/2020   Procedure: RIGHT TRANSCAROTID ARTERY REVASCULARIZATION;  Surgeon: Maeola Harman, MD;  Location: [facility] OR;  Service: Vascular;  Laterality: Right;   ULTRASOUND GUIDANCE FOR VASCULAR ACCESS Left 04/12/2020   Procedure: ULTRASOUND GUIDANCE FOR VASCULAR ACCESS, left femoral vein;  Surgeon: Maeola Harman, MD;  Location: [facility] OR;  Service: Vascular;  Laterality: Left;    Social  History   Socioeconomic History   Marital status: Widowed    Spouse name: Not on file   Number of children: Not on file   Years of education: Not on file   Highest education level: Not on file  Occupational History   Not on file  Tobacco Use   Smoking status: Never   Smokeless tobacco: Never  Vaping Use   Vaping Use: Never used  Substance and Sexual Activity   Alcohol use: Not Currently   Drug use: Never   Sexual activity: Not on file  Other Topics Concern   Not on file  Social History Narrative   Not on file   Social Determinants of Health   Financial Resource Strain: Not on file  Food Insecurity: Not on file  Transportation Needs: Not on file  Physical Activity: Not on file  Stress: Not on file  Social Connections: Not on file  Intimate Partner Violence: Not on file    Family History  Problem Relation Age of Onset   Heart failure Mother    Stroke Mother    Stroke Father    Hypertension Father    Diabetes Father    GER disease Cousin     Current Outpatient Medications  Medication Sig Dispense Refill   acetaminophen (TYLENOL) 325 MG tablet Take 2 tablets (650 mg total) by mouth every 6 (six) hours as needed for mild pain (or Fever >/=  101).     amLODipine (NORVASC) 10 MG tablet Take 1 tablet (10 mg total) by mouth every morning. 30 tablet 0   Cholecalciferol 25 MCG (1000 UT) tablet Take 1 tablet (1,000 Units total) by mouth every morning. 30 tablet 0   cloNIDine (CATAPRES) 0.1 MG tablet Take 2 tablets (0.2 mg total) by mouth 2 (two) times daily. 60 tablet 0   clopidogrel (PLAVIX) 75 MG tablet Take 75 mg by mouth daily.     docusate sodium (COLACE) 100 MG capsule Take 1 capsule (100 mg total) by mouth daily. 10 capsule 0   ferrous sulfate 325 (65 FE) MG tablet Take 1 tablet (325 mg total) by mouth every Monday, Wednesday, and Friday. 30 tablet 0   hydrALAZINE (APRESOLINE) 10 MG tablet Take 1 tablet (10 mg total) by mouth every 8 (eight) hours. 90 tablet 0    lidocaine (LIDODERM) 5 % Place 1 patch onto the skin daily. Remove & Discard patch within 12 hours or as directed by MD 30 patch 0   lisinopril (ZESTRIL) 40 MG tablet Take 1 tablet by mouth every morning.     methocarbamol (ROBAXIN) 500 MG tablet TAKE ONE TABLET BY MOUTH EVERY 8 HOURS AS NEEDED FOR MUSCLE SPASMA/MUSCLE CRAMPS     pantoprazole (PROTONIX) 40 MG tablet Take 1 tablet (40 mg total) by mouth daily. 30 tablet 0   PARoxetine (PAXIL) 20 MG tablet Take 1 tablet (20 mg total) by mouth daily. 30 tablet 0   polyethylene glycol (MIRALAX / GLYCOLAX) 17 g packet Take 17 g by mouth 2 (two) times daily. 14 each 0   rosuvastatin (CRESTOR) 40 MG tablet Take 1 tablet (40 mg total) by mouth daily. 30 tablet 5   No current facility-administered medications for this visit.    Allergies  Allergen Reactions   Trazodone Nausea And Vomiting     REVIEW OF SYSTEMS:   [X]  denotes positive finding, [ ]  denotes negative finding Cardiac  Comments:  Chest pain or chest pressure:    Shortness of breath upon exertion:    Short of breath when lying flat:    Irregular heart rhythm:        Vascular    Pain in calf, thigh, or hip brought on by ambulation: X RLE  Pain in feet at night that wakes you up from your sleep:     Blood clot in your veins:    Leg swelling:         Pulmonary    Oxygen at home:    Productive cough:     Wheezing:         Neurologic    Sudden weakness in arms or legs:     Sudden numbness in arms or legs:     Sudden onset of difficulty speaking or slurred speech:    Temporary loss of vision in one eye:     Problems with dizziness:         Gastrointestinal    Blood in stool:     Vomited blood:         Genitourinary    Burning when urinating:     Blood in urine:        Psychiatric    Major depression:         Hematologic    Bleeding problems:    Problems with blood clotting too easily:        Skin    Rashes or ulcers:        Constitutional  Fever or chills:       PHYSICAL EXAMINATION:  Vitals:   03/28/21 1409 03/28/21 1410  BP: 136/80 132/81  Pulse: 71   Resp: 20   Temp: 97.8 F (36.6 C)   TempSrc: Temporal   SpO2: 98%   Weight: 197 lb 12.8 oz (89.7 kg)   Height: 5\' 8"  (1.727 m)     General:  WDWN in NAD; vital signs documented above Gait: shuffling HENT: WNL, normocephalic Pulmonary: normal non-labored breathing , without wheezing Cardiac: regular HR, without  Murmurs without carotid bruit Vascular Exam/Pulses:2+ radial pulses bilaterally, 2+ DP pulses bilaterally. Feet warm and well perfused.  Extremities: without ischemic changes, without Gangrene , without cellulitis; without open wounds;  Musculoskeletal: no muscle wasting or atrophy  Neurologic: A&O X 3;  No focal weakness or paresthesias are detected Psychiatric:  The pt has Normal affect.   Non-Invasive Vascular Imaging:   VAS US Carotid 03/28/21  Summary:  Right Carotid: Velocities in the right ICA are consistent with a 1-39% stenosis. Non-hemodynamically significant plaque <50% noted in the CCA. The ECA appears >50% stenosed.   Left Carotid: Velocities in the left ICA are consistent with a 40-59% stenosis. Non-hemodynamically significant plaque <50% noted in the CCA.   Vertebrals:  Left vertebral artery demonstrates antegrade flow. Right vertebral artery demonstrates high resistant flow.  Subclavians: Normal flow hemodynamics were seen in bilateral subclavian arteries   ASSESSMENT/PLAN:: 66 y.o. male here for follow up for carotid artery stenosis. His duplex today shows patent right ICA stent. Left ICA with 40-59% stenosis, which is unchanged from prior studies. He has no new neurological deficits. He continues to have some residual speech difficulties and RUE/RLE weakness. He is in therapy for both and has had significant improvement form prior stroke in 2020.  - Patient has some mild claudication symptoms. We will obtain ABI at his next visit. He knows to call for  earlier follow up if he has worsening symptoms - I have encouraged him to continue his walking regimen - Continue Aspirin, statin, Plavix - reviewed signs and symptoms of TIA/ Stroke and patient understands should this occur he should seek immediate medical attention - He will follow up in 1 year with carotid duplex and ABI   Karoline Caldwell, PA-C [facility] [phone]  Clinic MD:   Cain/ Scot Dock

## 2021-03-29 ENCOUNTER — Ambulatory Visit: Payer: No Typology Code available for payment source | Admitting: Physical Therapy

## 2021-03-30 ENCOUNTER — Ambulatory Visit: Payer: No Typology Code available for payment source | Admitting: Physical Therapy

## 2021-04-02 ENCOUNTER — Ambulatory Visit: Payer: No Typology Code available for payment source | Admitting: Physical Therapy

## 2021-04-06 ENCOUNTER — Ambulatory Visit: Payer: No Typology Code available for payment source | Admitting: Physical Therapy

## 2021-04-09 ENCOUNTER — Ambulatory Visit: Payer: No Typology Code available for payment source | Admitting: Physical Therapy

## 2021-04-13 ENCOUNTER — Ambulatory Visit: Payer: No Typology Code available for payment source | Admitting: Physical Therapy

## 2021-04-16 ENCOUNTER — Ambulatory Visit: Payer: No Typology Code available for payment source | Admitting: Physical Therapy

## 2021-04-20 ENCOUNTER — Ambulatory Visit: Payer: No Typology Code available for payment source | Admitting: Physical Therapy

## 2021-06-20 ENCOUNTER — Telehealth: Payer: Self-pay | Admitting: *Deleted

## 2021-06-20 NOTE — Telephone Encounter (Signed)
Verlon Au, ? ?Please get Plavix hold. Thank you! ?

## 2021-06-22 NOTE — Telephone Encounter (Signed)
Letter regarding Plavix hold faxed to Dr. Willa Rough at [facility] ?

## 2021-07-04 ENCOUNTER — Telehealth: Payer: Self-pay | Admitting: *Deleted

## 2021-07-04 NOTE — Telephone Encounter (Signed)
Attempted to call x 3 no answer unable to leave message phone just keeping ringing then a busy signal sounded. ?

## 2021-07-04 NOTE — Telephone Encounter (Signed)
Pt. No show pre-visit procedure cancelled,no show letter sent. ?

## 2021-07-10 ENCOUNTER — Encounter
Payer: Medicare Other | Attending: Physical Medicine and Rehabilitation | Admitting: Physical Medicine and Rehabilitation

## 2021-07-10 ENCOUNTER — Encounter: Payer: Self-pay | Admitting: Physical Medicine and Rehabilitation

## 2021-07-10 VITALS — BP 161/89 | HR 77 | Ht 68.0 in | Wt 200.8 lb

## 2021-07-10 DIAGNOSIS — Z125 Encounter for screening for malignant neoplasm of prostate: Secondary | ICD-10-CM | POA: Diagnosis present

## 2021-07-10 DIAGNOSIS — I10 Essential (primary) hypertension: Secondary | ICD-10-CM | POA: Insufficient documentation

## 2021-07-10 MED ORDER — DICLOFENAC SODIUM 1 % EX GEL: 2.0000 g | Freq: Four times a day (QID) | CUTANEOUS | 3 refills | Status: AC

## 2021-07-10 NOTE — Progress Notes (Signed)
? ?Subjective:  ? ? Patient ID: Alec BasemanDeboise Sanchez, male    DOB: Feb 26, 1955, 66 y.o.   MRN: [id number] ? ?HPI ?Alec Sanchez is a 66 year old man who presents for hospital follow-up after CIR admission  ? ?BP cuff is broken at home so has not been checking regularly ? ?Been waling around building twice per day ? ?Went to the Friday and was 130/80.  ? ?Sometimes has neck pain.  ? ?Still needs handicap placard ? ?Daughter has been trying to visit as much as possible ? ?No constipation ? ?GI is going to schedule colonoscopy.  ? ?[facility]  ? ?Pain Inventory ?Average Pain 0 ?Pain Right Now 0 ? ? ?BOWEL ?Number of stools per week: 4 ?Oral laxative use Yes  ?Type of laxative miralax, docusate sodium  ? ?BLADDER ?Normal and Pads ? ? ? ? ?Mobility ?use a cane ?use a walker ?how many minutes can you walk? 15 ?ability to climb steps?  yes ?do you drive?  yes ? ?Function ?disabled: date disabled 11/2020 ?retired ?I need assistance with the following:  bathing, meal prep, household duties, shopping, and meds ? ?Neuro/Psych ?bladder control problems ?bowel control problems ?weakness ?trouble walking ?confusion ?depression ? ?Prior Studies ?Any changes since last visit?  no ? ?Physicians involved in your care ?Any changes since last visit?  no ? ? ?Family History  ?Problem Relation Age of Onset  ? Heart failure Mother   ? Stroke Mother   ? Stroke Father   ? Hypertension Father   ? Diabetes Father   ? GER disease Cousin   ? ?Social History  ? ?Socioeconomic History  ? Marital status: Widowed  ?  Spouse name: Not on file  ? Number of children: Not on file  ? Years of education: Not on file  ? Highest education level: Not on file  ?Occupational History  ? Not on file  ?Tobacco Use  ? Smoking status: Never  ? Smokeless tobacco: Never  ?Vaping Use  ? Vaping Use: Never used  ?Substance and Sexual Activity  ? Alcohol use: Not Currently  ? Drug use: Never  ? Sexual activity: Not on file  ?Other Topics Concern  ? Not on file   ?Social History Narrative  ? Not on file  ? ?Social Determinants of Health  ? ?Financial Resource Strain: Not on file  ?Food Insecurity: Not on file  ?Transportation Needs: Not on file  ?Physical Activity: Not on file  ?Stress: Not on file  ?Social Connections: Not on file  ? ?Past Surgical History:  ?Procedure Laterality Date  ? ANKLE SURGERY Left   ? TRANSCAROTID ARTERY REVASCULARIZATION?  Right 04/12/2020  ? Procedure: RIGHT TRANSCAROTID ARTERY REVASCULARIZATION;  Surgeon: Maeola Harmanain, Brandon Christopher, MD;  [facility];  Service: Vascular;  Laterality: Right;  ? ULTRASOUND GUIDANCE FOR VASCULAR ACCESS Left 04/12/2020  ? Procedure: ULTRASOUND GUIDANCE FOR VASCULAR ACCESS, left femoral vein;  Surgeon: Maeola Harmanain, Brandon Christopher, MD;  [facility];  Service: Vascular;  Laterality: Left;  ? ?Past Medical History:  ?Diagnosis Date  ? Anxiety   ? Carotid artery occlusion   ? Depression   ? Hyperlipidemia   ? Hypertension   ? Stroke [facility](H[facility])   ? ?BP (!) 161/89   Pulse 77   Ht 5\' 8"  (1.727 m)   Wt 200 lb 12.8 oz (91.1 kg)   SpO2 98%   BMI 30.53 kg/m?  ? ?Opioid Risk Score:   ?Fall Risk Score:  `1 ? ?Depression  screen PHQ 2/9 ? ? ?  01/30/2021  ?  1:19 PM  ?Depression screen PHQ 2/9  ?Decreased Interest 2  ?Down, Depressed, Hopeless 2  ?PHQ - 2 Score 4  ?Altered sleeping 2  ?Tired, decreased energy 2  ?Change in appetite 2  ?Feeling bad or failure about yourself  2  ?Trouble concentrating 2  ?Moving slowly or fidgety/restless 2  ?Suicidal thoughts 0  ?PHQ-9 Score 16  ?  ? ?Review of Systems  ?Constitutional: Negative.   ?HENT: Negative.    ?Eyes: Negative.   ?Cardiovascular: Negative.   ?Gastrointestinal:  Positive for constipation.  ?Endocrine: Negative.   ?Musculoskeletal:  Positive for gait problem.  ?Skin: Negative.   ?Allergic/Immunologic: Negative.   ?Neurological:  Positive for weakness.  ?Hematological:   ?     Plavix  ?Psychiatric/Behavioral:  Positive for confusion and dysphoric mood.   ?All other systems  reviewed and are negative. ? ?   ?Objective:  ? Physical Exam ?Gen: no distress, normal appearing ?HEENT: oral mucosa pink and moist, NCAT ?Cardio: Reg rate ?Chest: normal effort, normal rate of breathing ?Abd: soft, non-distended ?Ext: no edema ?Psych: pleasant, normal affect ?Skin: intact ?Musculoskeletal: 4/5 strength throughout ?   ?Assessment & Plan:  ?1) CVA ?-continue therapies ?-would benefit from handicap placard to increase her mobility in the community ?-provided dietary and exercise counseling ?-discussed that hypertension is number one reversible risk factor for stroke.   ? ?2) HTN: ?Continue clonidine to 0.2mg  BID ?-Advised checking BP daily at home and logging results to bring into follow-up appointment with PCP and myself. ?-Reviewed BP meds today.  ?-Advised regarding healthy foods that can help lower blood pressure and provided with a list: ?1) citrus foods- high in vitamins and minerals ?2) salmon and other fatty fish - reduces inflammation and oxylipins ?3) swiss chard (leafy green)- high level of nitrates ?4) pumpkin seeds- one of the best natural sources of magnesium ?5) Beans and lentils- high in fiber, magnesium, and potassium ?6) Berries- high in flavonoids ?7) Amaranth (whole grain, can be cooked similarly to rice and oats)- high in magnesium and fiber ?8) Pistachios- even more effective at reducing BP than other nuts ?9) Carrots- high in phenolic compounds that relax blood vessels and reduce inflammation ?10) Celery- contain phthalides that relax tissues of arterial walls ?11) Tomatoes- can also improve cholesterol and reduce risk of heart disease ?12) Broccoli- good source of magnesium, calcium, and potassium ?13) Greek yogurt: high in potassium and calcium ?14) Herbs and spices: Celery seed, cilantro, saffron, lemongrass, black cumin, ginseng, cinnamon, cardamom, sweet basil, and ginger ?15) Chia and flax seeds- also help to lower cholesterol and blood sugar ?16) Beets- high levels of  nitrates that relax blood vessels  ?17) spinach and bananas- high in potassium ? ?-Provided lise of supplements that can help with hypertension:  ?1) magnesium: one high quality brand is Bioptemizers since it contains all 7 types of magnesium, otherwise over the counter magnesium gluconate 400mg  is a good option ?2) B vitamins ?3) vitamin D ?4) potassium ?5) CoQ10 ?6) L-arginine ?7) Vitamin C ?8) Beetroot ?-Educated that goal BP is 120/80. ?-Made goal to incorporate some of the above foods into diet.   ? ?3) Constipation:  ?-discussed that GI bleeding is likely secondary to constipation as per GI, change protonix to PRN if not experiencing symptoms now that bowels are moving regularly.  ?-Provided list of following foods that help with constipation and highlighted a few: ?1) prunes- contain high amounts of  fiber.  ?2) apples- has a form of dietary fiber called pectin that accelerates stool movement and increases beneficial gut bacteria ?3) pears- in addition to fiber, also high in fructose and sorbitol which have laxative effect ?4) figs- contain an enzyme ficin which helps to speed colonic transit ?5) kiwis- contain an enzyme actinidin that improves gut motility and reduces constipation ?6) oranges- rich in pectin (like apples) ?7) grapefruits- contain a flavanol naringenin which has a laxative effect ?8) vegetables- rich in fiber and also great sources of folate, vitamin C, and K ?9) artichoke- high in inulin, prebiotic great for the microbiome ?10) chicory- increases stool frequency and softness (can be added to coffee) ?11) rhubarb- laxative effect ?12) sweet potato- high fiber ?13) beans, peas, and lentils- contain both soluble and insoluble fiber ?14) chia seeds- improves intestinal health and gut flora ?15) flaxseeds- laxative effect ?16) whole grain rye bread- high in fiber ?17) oat bran- high in soluble and insoluble fiber ?18) kefir- softens stools ?-recommended to try at least one of these foods every  day.  ?-drink 6-8 glasses of water per day ?-walk regularly, especially after meals.  ? ?4) Neck pain ?Continue lidocaine patch ?-recommended heating pad ?-apply voltaren gel ? ? ? ? ?    ? ? ? ?

## 2021-07-10 NOTE — Patient Instructions (Addendum)
-  recommended doTerra Deep Blue Essential oil and applied to area of pain today- discussed that this is made of natural plant oils. Shared by personal experience of benefit from use of this essential oil ?-provided a link to a pdf of Pete Escogue's musculoskeletal alignment exercises: [email or web address]  ?

## 2021-07-20 NOTE — Telephone Encounter (Signed)
I am unable to find the hold in pre-visit rooms and I did not see this in Epic. Sorry!

## 2021-07-20 NOTE — Telephone Encounter (Signed)
You know, I faxed it and I think I gave it to Hilda Lias didn't I?  I thought I remembered getting that back and giving it to yall.  I faxed it again if I'm wrong.

## 2021-07-20 NOTE — Telephone Encounter (Signed)
I could be mistaken.  I refaxed it anyway so I'll get with you as soon as I get it back

## 2021-07-20 NOTE — Telephone Encounter (Signed)
Patient has rescheduled. PV is on 07/30/21. Did you get Plavix hold?

## 2021-07-25 ENCOUNTER — Encounter: Payer: No Typology Code available for payment source | Admitting: Internal Medicine

## 2021-07-30 ENCOUNTER — Telehealth: Payer: Self-pay | Admitting: *Deleted

## 2021-07-30 ENCOUNTER — Encounter: Payer: No Typology Code available for payment source | Admitting: Internal Medicine

## 2021-07-30 NOTE — Telephone Encounter (Signed)
Called pt, no answer. Mailed no show letter, PV and Colon cancelled.

## 2021-07-30 NOTE — Telephone Encounter (Signed)
Called patient x2 for pre-visit phone visit. No answer, no answering machine. Will try later.

## 2021-08-02 ENCOUNTER — Ambulatory Visit (INDEPENDENT_AMBULATORY_CARE_PROVIDER_SITE_OTHER): Payer: No Typology Code available for payment source | Admitting: Adult Health

## 2021-08-02 ENCOUNTER — Encounter: Payer: Self-pay | Admitting: Adult Health

## 2021-08-02 VITALS — BP 102/65 | HR 72 | Ht 67.0 in | Wt 199.0 lb

## 2021-08-02 DIAGNOSIS — R269 Unspecified abnormalities of gait and mobility: Secondary | ICD-10-CM

## 2021-08-02 DIAGNOSIS — I69398 Other sequelae of cerebral infarction: Secondary | ICD-10-CM

## 2021-08-02 DIAGNOSIS — E785 Hyperlipidemia, unspecified: Secondary | ICD-10-CM

## 2021-08-02 DIAGNOSIS — I639 Cerebral infarction, unspecified: Secondary | ICD-10-CM

## 2021-08-02 MED ORDER — ROSUVASTATIN CALCIUM 40 MG PO TABS: 40.0000 mg | ORAL_TABLET | Freq: Every day | ORAL | 5 refills | Status: AC

## 2021-08-02 MED ORDER — VITAMIN B-12 1000 MCG PO TABS: 1000.0000 ug | ORAL_TABLET | Freq: Every day | ORAL | 11 refills | Status: AC

## 2021-08-02 NOTE — Progress Notes (Signed)
[facility] [address] [place]. [place] [address] [phone]       STROKE FOLLOW UP NOTE  [name] Date of Birth:  03-12-1955 Medical Record Number:  [id number]   Reason for Referral: stroke follow up    SUBJECTIVE:   CHIEF COMPLAINT:  Chief Complaint  Patient presents with   Follow-up    Rm 2 alone Pt is well,     HPI:   Update 08/02/2021 JM: Patient returns for stroke follow-up visit after prior visit 5 months ago unaccompanied.  Overall stable without new stroke/TIA symptoms.  Reports residual right hand weakness and mild gait impairment.  Both have improved since prior visit and stable.  Denies any residual speech difficulty, currently at baseline.  Lives alone, maintains ADLs independently. Drives short distance.  Daughter assists with some IADLs.  Compliant on Plavix, denies side effects.  At prior visit, obtained lipid panel which showed LDL 112 despite compliance on high-dose atorvastatin, recommended switching to Crestor 40 mg daily.  [facility] showed [facility] LDL at 148.  He denies ever switching to Crestor and has remained on atorvastatin.  Blood pressure today 102/65.  Recent lab work also showed B12 deficiency, not currently on supplement.  Rout[facility] followed by [facility]. no new concerns at this time.     History provided for reference purposes only Initial visit 02/27/2021 JM: Patient being seen for initial hospital follow-up accompanied by his daughter. Doing well since discharge. Reports residual right hand weakness, gait impairment and mild slurred speech. Has been improving. Completed HH therapies - interested in doing additional therapies. Use of cane - previously using only on occasion. [facility] with [facility] on [facility] to be evaluated for Rollator walker.  Lives alone.  Maintains ADLs independently.  Daughter assists with medications and cooking and transportation.  Denies new stroke/TIA symptoms. Compliant on Plavix and atorvastatin.  No additional issues with bleeding. Blood pressure today 132/84. Monitors at home which has been stable. Has not had repeat cholesterol levels since hospitalization. Did have f/u with GI - plans on colonoscopy 6 mo post stroke. No further concerns at this time  Stroke admission 02/09/2021 Alec Sanchez is an 66 y.o. male with a PMHx of stroke, carotid artery occlusion, carotid artery stenosis s/p Right TCAR on 04/12/2020 by Dr. Randie Heinz for asymptomatic high grade ICA stenosis, HLD and HTN who presented via EMS on 12/01/2020 for slurred speech and right facial droop but per note review, family reported patients first neurological deficits were noticed on 10/4 with facial weakness, slurred speech worse than baseline, AMS, loss of bowel and bladder, slowing of movement and speech and right hand weakness. ED exam noted generalized weakness in all extremities and slurred speech and hypertensive.  Personally reviewed hospitalization pertinent progress notes, lab work and imaging.  Evaluated by Dr. Pearlean Brownie for 3 small acute ischemic infarcts involving the right anterior genu of the corpus callosum, periventricular white matter of the right posterior corona radiata and right lentiform nucleus likely due to small vessel disease.  CTA head/neck progressive severe left VA stenosis, chronic L ICA 60% stenosis, unchanged mild to moderate bilateral intracranial ICA stenosis and mild left M1 stenosis.  EF 60 to 65%.  LDL 144.  A1c 5.6.  Not compliant with aspirin and Plavix after prior stroke in 01/2020 - recommend DAPT for 3 weeks then aspirin alone.  Noncompliant with antihypertensive regimen stabilized during admission.  Noncompliant with atorvastatin -advised to resume at discharge. Hx of strooke 2021 - L  BG/CR and punctate left periatrial white matter infarcts.  Participated in sleep smart study with screen failure as negative for sleep apnea.  Therapy eval's recommended CIR for functional decline. Admitted from 10/12-10/27 -  episode of hematochezia therefore aspirin held and remained on plavix alone - suspect in setting of stercoral ulceration - declined colonoscopy during hospitalization -advised follow-up outpatient.        PERTINENT IMAGING  Per recent hospitalization MRI brain: Three distinct 8 mm acute ischemic infarcts involving the right anterior genu of the corpus callosum, periventricular white matter of the right posterior corona radiata, and right lentiform nucleus . No associated hemorrhage or mass effect. Scattered T2/FLAIR signal abnormality involving the subcortical aspects of the right frontal and parietal regions as above, new as compared to prior MRI from 01/31/2020. 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 Also noted is underlying age-related cerebral atrophy with advanced chronic microvascular ischemic disease, progressed from prior. 3. CTA of head and neck: No emergent large vessel occlusion. Interval right carotid stenting without residual stenosis. 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 Progressive, severe proximal left vertebral artery stenosis. Unchanged 60% proximal left ICA stenosis. Unchanged mild to moderate bilateral intracranial ICA stenoses and mild left M1 stenosis. Aortic Atherosclerosisode Stroke CT head  2D Echo ejection fraction 60 to 65%.  No cardiac source of embolism. LDL 144 HgbA1c 5.6        ROS:   14 system review of systems performed and negative with exception of those listed in HPI  PMH:  Past Medical History:  Diagnosis Date   Anxiety    Carotid artery occlusion    Depression    Hyperlipidemia    Hypertension    Stroke [facility])     PSH:  Past Surgical History:  Procedure Laterality Date   ANKLE SURGERY Left    TRANSCAROTID ARTERY REVASCULARIZATION  Right 04/12/2020   Procedure: RIGHT TRANSCAROTID ARTERY REVASCULARIZATION;  Surgeon: Maeola Harman, MD;   Location: [facility] OR;  Service: Vascular;  Laterality: Right;   ULTRASOUND GUIDANCE FOR VASCULAR ACCESS Left 04/12/2020   Procedure: ULTRASOUND GUIDANCE FOR VASCULAR ACCESS, left femoral vein;  Surgeon: Maeola Harman, MD;  Location: [facility] OR;  Service: Vascular;  Laterality: Left;    Social History:  Social History   Socioeconomic History   Marital status: Widowed    Spouse name: Not on file   Number of children: Not on file   Years of education: Not on file   Highest education level: Not on file  Occupational History   Not on file  Tobacco Use   Smoking status: Never   Smokeless tobacco: Never  Vaping Use   Vaping Use: Never used  Substance and Sexual Activity   Alcohol use: Not Currently   Drug use: Never   Sexual activity: Not on file  Other Topics Concern   Not on file  Social History Narrative   Not on file   Social Determinants of Health   Financial Resource Strain: Not on file  Food Insecurity: Not on file  Transportation Needs: Not on file  Physical Activity: Not on file  Stress: Not on file  Social Connections: Not on file  Intimate Partner Violence: Not on file    Family History:  Family History  Problem Relation Age of Onset   Heart failure Mother    Stroke Mother    Stroke Father    Hypertension Father    Diabetes Father  GER disease Cousin     Medications:   Current Outpatient Medications on File Prior to Visit  Medication Sig Dispense Refill   acetaminophen (TYLENOL) 325 MG tablet Take 2 tablets (650 mg total) by mouth every 6 (six) hours as needed for mild pain (or Fever >/= 101).     amLODipine (NORVASC) 10 MG tablet Take 1 tablet (10 mg total) by mouth every morning. 30 tablet 0   Cholecalciferol 25 MCG (1000 UT) tablet Take 1 tablet (1,000 Units total) by mouth every morning. 30 tablet 0   cloNIDine (CATAPRES) 0.1 MG tablet Take 2 tablets (0.2 mg total) by mouth 2 (two) times daily. 60 tablet 0   clopidogrel (PLAVIX) 75 MG tablet Take  75 mg by mouth daily.     diclofenac Sodium (VOLTAREN) 1 % GEL Apply 2 g topically 4 (four) times daily. 2 g 3   docusate sodium (COLACE) 100 MG capsule Take 1 capsule (100 mg total) by mouth daily. 10 capsule 0   ferrous sulfate 325 (65 FE) MG tablet Take 1 tablet (325 mg total) by mouth every Monday, Wednesday, and Friday. 30 tablet 0   hydrALAZINE (APRESOLINE) 10 MG tablet Take 1 tablet (10 mg total) by mouth every 8 (eight) hours. 90 tablet 0   lidocaine (LIDODERM) 5 % Place 1 patch onto the skin daily. Remove & Discard patch within 12 hours or as directed by MD 30 patch 0   lisinopril (ZESTRIL) 40 MG tablet Take 1 tablet by mouth every morning.     methocarbamol (ROBAXIN) 500 MG tablet TAKE ONE TABLET BY MOUTH EVERY 8 HOURS AS NEEDED FOR MUSCLE SPASMA/MUSCLE CRAMPS     pantoprazole (PROTONIX) 40 MG tablet Take 1 tablet (40 mg total) by mouth daily. 30 tablet 0   PARoxetine (PAXIL) 40 MG tablet TAKE ONE-HALF TABLET BY MOUTH DAILY FOR MENTAL HEALTH     No current facility-administered medications on file prior to visit.    Allergies:   Allergies  Allergen Reactions   Trazodone Nausea And Vomiting      OBJECTIVE:  Physical Exam  Vitals:   08/02/21 1413  BP: 102/65  Pulse: 72  Weight: 199 lb (90.3 kg)  Height: 5\' 7"  (1.702 m)    Body mass index is 31.17 kg/m. No results found.  General: well developed, well nourished, very pleasant elderly African-American male, seated, in no evident distress Head: head normocephalic and atraumatic.   Neck: supple with no carotid or supraclavicular bruits Cardiovascular: regular rate and rhythm, no murmurs Musculoskeletal: no deformity Skin:  no rash/petichiae Vascular:  Normal pulses all extremities   Neurologic Exam Mental Status: Awake and fully alert.  Mild dysarthria (d/t poor denture per pt).  No evidence of aphasia.  Oriented to place and time. Recent and remote memory intact. Attention span, concentration and fund of knowledge  appropriate during visit. Mood and affect appropriate.  Cranial Nerves: Fundoscopic exam reveals sharp disc margins. Pupils equal, briskly reactive to light. Extraocular movements full without nystagmus. Visual fields full to confrontation. Hearing intact. Facial sensation intact.  Mild right lower facial weakness.  Tongue, palate moves normally and symmetrically.  Motor: Normal strength, bulk and tone left upper and lower extremity RUE: 4+/5 with decreased grip strength and hand dexterity RLE: 5/5 Sensory.: intact to touch , pinprick , position and vibratory sensation.  Coordination: Rapid alternating movements normal in all extremities except decreased right hand. Finger-to-nose and heel-to-shin mild right-sided ataxia, normal on left Gait and Station: Arises from chair without  difficulty. Stance is wide-based.  Initial unsteady gait which gradually improved, mild imbalance without assistive device.  Tandem walk and heel toe not attempted Reflexes: 1+ and symmetric. Toes downgoing.          ASSESSMENT: Kameel Taitano is a 66 y.o. year old male with recent infarcts involving right anterior genu of corpus callosum, periventricular white matter of the right posterior corona radiata and right lentiform nucleus on 12/01/2020 likely due to small vessel disease. Vascular risk factors include prior strokes, HTN, HLD, intracranial stenosis, R ICA stenosis s/p R TCAR 03/2020, and medication noncompliance.      PLAN:  Recurrent strokes :  Residual deficit: right sided ataxia, slight right hand weakness and gait abnormality.  Declines interest in any additional therapies at this time. Continue clopidogrel 75 mg daily  and change atorvastatin to Crestor 40 mg daily for secondary stroke prevention.   Discussed secondary stroke prevention measures and importance of close PCP follow up for aggressive stroke risk factor management including BP goal<130/90, and HLD with LDL goal<70.  Stroke labs 06/2021: LDL  148, A1c 5.9 - switched atorvastatin to Crestor 40 mg daily.  Request [facility] with [facility] in roughly 3 months for repeat lipid panel I have gone over the pathophysiology of stroke, warning signs and symptoms, risk factors and their management in some detail with instructions to go to the closest emergency room for symptoms of concern. Carotid stenosis: followed by Dr. Donzetta Matters B12 deficiency: Recent B12 level 208 (by [facility]).  Recommend initiating B12 supplement with repeat B12 level in 3 to 4 months through [place]     Follow up in 6 months or call earlier if needed   CC:  PCP: [facility]     I spent 32 minutes of face-to-face and non-face-to-face time with patient.  This included previsit chart review, lab review, study review, order entry, electronic health record documentation, patient education and discussion regarding prior stroke with residual deficits, secondary stroke prevention measures and importance of managing stroke risk factors, and answered all other questions to patients satisfaction  Frann Rider, [facility]  [facility] [address] [place], [place] [address]  Phone [phone] Fax [phone] Note: This document was prepared with digital dictation and possible smart phrase technology. Any transcriptional errors that result from this process are unintentional.

## 2021-08-02 NOTE — Patient Instructions (Addendum)
Continue clopidogrel 75 mg daily  and switch atorvastatin to Crestor  for secondary stroke prevention - switching to Crestor will hopefully decrease your LDL or bad cholesterol levels. [facility].   Recommend starting B12 supplement for recent low B12 level - ensure follow up with [facility] in the next 3-4 months for repeat levels   Continue to follow up with PCP regarding cholesterol and blod pressure management  Maintain strict control of hypertension with blood pressure goal below 130/90, diabetes with hemoglobin A1c goal below 7.0 % and cholesterol with LDL cholesterol (bad cholesterol) goal below 70 mg/dL.   Signs of a Stroke? Follow the BEFAST method:  Balance Watch for a sudden loss of balance, trouble with coordination or vertigo Eyes Is there a sudden loss of vision in one or both eyes? Or double vision?  Face: Ask the person to smile. Does one side of the face droop or is it numb?  Arms: Ask the person to raise both arms. Does one arm drift downward? Is there weakness or numbness of a leg? Speech: Ask the person to repeat a simple phrase. Does the speech sound slurred/strange? Is the person confused ? Time: If you observe any of these signs, call 911.     Followup in the future with me in 6 months or call earlier if needed       Thank you for coming to see [place] at [facility]. I hope we have been able to provide you high quality care today.  You may receive a patient satisfaction survey over the next few weeks. We would appreciate your feedback and comments so that we may continue to improve ourselves and the health of our patients.

## 2021-08-27 ENCOUNTER — Encounter: Payer: No Typology Code available for payment source | Admitting: Internal Medicine

## 2022-01-28 NOTE — Progress Notes (Unsigned)
[facility] [address] [place]. [place] [address] [phone]       STROKE FOLLOW UP NOTE  [name] Date of Birth:  01/08/1956 Medical Record Number:  [id number]   Reason for Referral: stroke follow up    SUBJECTIVE:   CHIEF COMPLAINT:  No chief complaint on file.   HPI:   Update 01/29/2022 JM: Patient returns for 13-month stroke follow-up.  Overall stable without new stroke/TIA symptoms.  Reports residual ***.   Compliant on Plavix and Crestor Blood pressure well controlled [facility] ***       History provided for reference purposes only Update 08/02/2021 JM: Patient returns for stroke follow-up visit after prior visit 5 months ago unaccompanied.  Overall stable without new stroke/TIA symptoms.  Reports residual right hand weakness and mild gait impairment.  Both have improved since prior visit and stable.  Denies any residual speech difficulty, currently at baseline.  Lives alone, maintains ADLs independently. Drives short distance.  Daughter assists with some IADLs.  Compliant on Plavix, denies side effects.  At prior visit, obtained lipid panel which showed LDL 112 despite compliance on high-dose atorvastatin, recommended switching to Crestor 40 mg daily.  Re[facility] with [facility] showed elevated LDL at 148.  He denies ever switching to Crestor and has remained on atorvastatin.  Blood pressure today 102/65.  Recent lab work also showed B12 deficiency, not currently on supplement.  [facility]. no new concerns at this time.  Initial visit 02/27/2021 JM: Patient being seen for initial hospital follow-up accompanied by his daughter. Doing well since discharge. Reports residual right hand weakness, gait impairment and mild slurred speech. Has been improving. Completed HH therapies - interested in doing additional therapies. Use of cane - previously using only on occasion. [facility] to be evaluated for Rollator  walker.  Lives alone.  Maintains ADLs independently.  Daughter assists with medications and cooking and transportation.  Denies new stroke/TIA symptoms. Compliant on Plavix and atorvastatin. No additional issues with bleeding. Blood pressure today 132/84. Monitors at home which has been stable. Has not had repeat cholesterol levels since hospitalization. Did have f/u with GI - plans on colonoscopy 6 mo post stroke. No further concerns at this time  Stroke admission 02/09/2021 Alec Sanchez is an 66 y.o. male with a PMHx of stroke, carotid artery occlusion, carotid artery stenosis s/p Right TCAR on 04/12/2020 by Dr. Randie Heinz for asymptomatic high grade ICA stenosis, HLD and HTN who presented via EMS on 12/01/2020 for slurred speech and right facial droop but per note review, family reported patients first neurological deficits were noticed on 10/4 with facial weakness, slurred speech worse than baseline, AMS, loss of bowel and bladder, slowing of movement and speech and right hand weakness. ED exam noted generalized weakness in all extremities and slurred speech and hypertensive.  Personally reviewed hospitalization pertinent progress notes, lab work and imaging.  Evaluated by Dr. Pearlean Brownie for 3 small acute ischemic infarcts involving the right anterior genu of the corpus callosum, periventricular white matter of the right posterior corona radiata and right lentiform nucleus likely due to small vessel disease.  CTA head/neck progressive severe left VA stenosis, chronic L ICA 60% stenosis, unchanged mild to moderate bilateral intracranial ICA stenosis and mild left M1 stenosis.  EF 60 to 65%.  LDL 144.  A1c 5.6.  Not compliant with aspirin and Plavix after prior stroke in 01/2020 - recommend DAPT for 3 weeks then aspirin  alone.  Noncompliant with antihypertensive regimen stabilized during admission.  Noncompliant with atorvastatin -advised to resume at discharge. Hx of strooke 2021 - L BG/CR and punctate left periatrial  white matter infarcts.  Participated in sleep smart study with screen failure as negative for sleep apnea.  Therapy eval's recommended CIR for functional decline. Admitted from 10/12-10/27 - episode of hematochezia therefore aspirin held and remained on plavix alone - suspect in setting of stercoral ulceration - declined colonoscopy during hospitalization -advised follow-up outpatient.        PERTINENT IMAGING  Per recent hospitalization MRI brain: Three distinct 8 mm acute ischemic infarcts involving the right anterior genu of the corpus callosum, periventricular white matter of the right posterior corona radiata, and right lentiform nucleus . No associated hemorrhage or mass effect. Scattered T2/FLAIR signal abnormality involving the subcortical aspects of the right frontal and parietal regions as above, new as compared to prior MRI from 01/31/2020. 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 Also noted is underlying age-related cerebral atrophy with advanced chronic microvascular ischemic disease, progressed from prior. 3. CTA of head and neck: No emergent large vessel occlusion. Interval right carotid stenting without residual stenosis. 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 Progressive, severe proximal left vertebral artery stenosis. Unchanged 60% proximal left ICA stenosis. Unchanged mild to moderate bilateral intracranial ICA stenoses and mild left M1 stenosis. Aortic Atherosclerosisode Stroke CT head  2D Echo ejection fraction 60 to 65%.  No cardiac source of embolism. LDL 144 HgbA1c 5.6        ROS:   14 system review of systems performed and negative with exception of those listed in HPI  PMH:  Past Medical History:  Diagnosis Date   Anxiety    Carotid artery occlusion    Depression    Hyperlipidemia    Hypertension    Stroke [facility](HCC)     PSH:  Past Surgical History:  Procedure  Laterality Date   ANKLE SURGERY Left    TRANSCAROTID ARTERY REVASCULARIZATION  Right 04/12/2020   Procedure: RIGHT TRANSCAROTID ARTERY REVASCULARIZATION;  Surgeon: Maeola Harmanain, Brandon Christopher, MD;  [facility];  Service: Vascular;  Laterality: Right;   ULTRASOUND GUIDANCE FOR VASCULAR ACCESS Left 04/12/2020   Procedure: ULTRASOUND GUIDANCE FOR VASCULAR ACCESS, left femoral vein;  Surgeon: Maeola Harmanain, Brandon Christopher, MD;  [facility];  Service: Vascular;  Laterality: Left;    Social History:  Social History   Socioeconomic History   Marital status: Widowed    Spouse name: Not on file   Number of children: Not on file   Years of education: Not on file   Highest education level: Not on file  Occupational History   Not on file  Tobacco Use   Smoking status: Never   Smokeless tobacco: Never  Vaping Use   Vaping Use: Never used  Substance and Sexual Activity   Alcohol use: Not Currently   Drug use: Never   Sexual activity: Not on file  Other Topics Concern   Not on file  Social History Narrative   Not on file   Social Determinants of Health   Financial Resource Strain: Not on file  Food Insecurity: Not on file  Transportation Needs: Not on file  Physical Activity: Not on file  Stress: Not on file  Social Connections: Not on file  Intimate Partner Violence: Not on file    Family History:  Family History  Problem Relation Age of Onset   Heart  failure Mother    Stroke Mother    Stroke Father    Hypertension Father    Diabetes Father    GER disease Cousin     Medications:   Current Outpatient Medications on File Prior to Visit  Medication Sig Dispense Refill   acetaminophen (TYLENOL) 325 MG tablet Take 2 tablets (650 mg total) by mouth every 6 (six) hours as needed for mild pain (or Fever >/= 101).     amLODipine (NORVASC) 10 MG tablet Take 1 tablet (10 mg total) by mouth every morning. 30 tablet 0   Cholecalciferol 25 MCG (1000 UT) tablet Take 1 tablet (1,000  Units total) by mouth every morning. 30 tablet 0   cloNIDine (CATAPRES) 0.1 MG tablet Take 2 tablets (0.2 mg total) by mouth 2 (two) times daily. 60 tablet 0   clopidogrel (PLAVIX) 75 MG tablet Take 75 mg by mouth daily.     diclofenac Sodium (VOLTAREN) 1 % GEL Apply 2 g topically 4 (four) times daily. 2 g 3   docusate sodium (COLACE) 100 MG capsule Take 1 capsule (100 mg total) by mouth daily. 10 capsule 0   ferrous sulfate 325 (65 FE) MG tablet Take 1 tablet (325 mg total) by mouth every Monday, Wednesday, and Friday. 30 tablet 0   hydrALAZINE (APRESOLINE) 10 MG tablet Take 1 tablet (10 mg total) by mouth every 8 (eight) hours. 90 tablet 0   lidocaine (LIDODERM) 5 % Place 1 patch onto the skin daily. Remove & Discard patch within 12 hours or as directed by MD 30 patch 0   lisinopril (ZESTRIL) 40 MG tablet Take 1 tablet by mouth every morning.     methocarbamol (ROBAXIN) 500 MG tablet TAKE ONE TABLET BY MOUTH EVERY 8 HOURS AS NEEDED FOR MUSCLE SPASMA/MUSCLE CRAMPS     pantoprazole (PROTONIX) 40 MG tablet Take 1 tablet (40 mg total) by mouth daily. 30 tablet 0   PARoxetine (PAXIL) 40 MG tablet TAKE ONE-HALF TABLET BY MOUTH DAILY FOR MENTAL HEALTH     rosuvastatin (CRESTOR) 40 MG tablet Take 1 tablet (40 mg total) by mouth daily. 30 tablet 5   vitamin B-12 (CYANOCOBALAMIN) 1000 MCG tablet Take 1 tablet (1,000 mcg total) by mouth daily. 30 tablet 11   No current facility-administered medications on file prior to visit.    Allergies:   Allergies  Allergen Reactions   Trazodone Nausea And Vomiting      OBJECTIVE:  Physical Exam  There were no vitals filed for this visit.   There is no height or weight on file to calculate BMI. No results found.  General: well developed, well nourished, very pleasant elderly African-American male, seated, in no evident distress Head: head normocephalic and atraumatic.   Neck: supple with no carotid or supraclavicular bruits Cardiovascular: regular  rate and rhythm, no murmurs Musculoskeletal: no deformity Skin:  no rash/petichiae Vascular:  Normal pulses all extremities   Neurologic Exam Mental Status: Awake and fully alert.  Mild dysarthria (d/t poor denture per pt).  No evidence of aphasia.  Oriented to place and time. Recent and remote memory intact. Attention span, concentration and fund of knowledge appropriate during visit. Mood and affect appropriate.  Cranial Nerves: Fundoscopic exam reveals sharp disc margins. Pupils equal, briskly reactive to light. Extraocular movements full without nystagmus. Visual fields full to confrontation. Hearing intact. Facial sensation intact.  Mild right lower facial weakness.  Tongue, palate moves normally and symmetrically.  Motor: Normal strength, bulk and tone left upper and  lower extremity RUE: 4+/5 with decreased grip strength and hand dexterity RLE: 5/5 Sensory.: intact to touch , pinprick , position and vibratory sensation.  Coordination: Rapid alternating movements normal in all extremities except decreased right hand. Finger-to-nose and heel-to-shin mild right-sided ataxia, normal on left Gait and Station: Arises from chair without difficulty. Stance is wide-based.  Initial unsteady gait which gradually improved, mild imbalance without assistive device.  Tandem walk and heel toe not attempted Reflexes: 1+ and symmetric. Toes downgoing.          ASSESSMENT: Alec Sanchez is a 66 y.o. year old male with infarcts involving right anterior genu of corpus callosum, periventricular white matter of the right posterior corona radiata and right lentiform nucleus on 12/01/2020 likely due to small vessel disease. Vascular risk factors include prior strokes, HTN, HLD, intracranial stenosis, R ICA stenosis s/p R TCAR 03/2020, and medication noncompliance.      PLAN:  Recurrent strokes :  Residual deficit: right sided ataxia, slight right hand weakness and gait abnormality.  Declines interest in any  additional therapies at this time. Continue clopidogrel 75 mg daily  and change atorvastatin to Crestor 40 mg daily for secondary stroke prevention.   Discussed secondary stroke prevention measures and importance of close PCP follow up for aggressive stroke risk factor management including BP goal<130/90, and HLD with LDL goal<70.  Stroke labs 06/2021: LDL 148, A1c 5.9 - switched atorvastatin to Crestor 40 mg daily.  [facility] for [facility] I have gone over the pathophysiology of stroke, warning signs and symptoms, risk factors and their management in some detail with instructions to go to the closest emergency room for symptoms of concern. Carotid stenosis: followed by Dr. Randie Heinz B12 deficiency: Recent B12 level 208 (by [facility]).  Recommend initiating B12 supplement with repeat B12 level in 3 to 4 months through [place]     Follow up in 6 months or call earlier if needed   CC:  PCP: [facility]     I spent 32 minutes of face-to-face and non-face-to-face time with patient.  This included previsit chart review, lab review, study review, order entry, electronic health record documentation, patient education and discussion regarding prior stroke with residual deficits, secondary stroke prevention measures and importance of managing stroke risk factors, and answered all other questions to patients satisfaction  Ihor Austin, [facility]  [facility] [address] [place], [place] [address]  Phone [phone] Fax [phone] Note: This document was prepared with digital dictation and possible smart phrase technology. Any transcriptional errors that result from this process are unintentional.

## 2022-01-29 ENCOUNTER — Ambulatory Visit (INDEPENDENT_AMBULATORY_CARE_PROVIDER_SITE_OTHER): Payer: Medicare Other | Admitting: Adult Health

## 2022-01-29 ENCOUNTER — Encounter: Payer: Self-pay | Admitting: Adult Health

## 2022-01-29 VITALS — BP 132/76 | HR 75 | Ht 67.0 in | Wt 196.0 lb

## 2022-01-29 DIAGNOSIS — I6523 Occlusion and stenosis of bilateral carotid arteries: Secondary | ICD-10-CM

## 2022-01-29 DIAGNOSIS — I639 Cerebral infarction, unspecified: Secondary | ICD-10-CM | POA: Diagnosis not present

## 2022-01-29 DIAGNOSIS — Z720 Tobacco use: Secondary | ICD-10-CM

## 2022-01-29 NOTE — Patient Instructions (Addendum)
Continue clopidogrel 75 mg daily  and Crestor  for secondary stroke prevention  Highly recommend complete tobacco cessation as continued use greatly increases risk of additional strokes  Continue to follow up with PCP regarding cholesterol and blood pressure management  Maintain strict control of hypertension with blood pressure goal below 130/90 and cholesterol with LDL cholesterol (bad cholesterol) goal below 70 mg/dL.   Signs of a Stroke? Follow the BEFAST method:  Balance Watch for a sudden loss of balance, trouble with coordination or vertigo Eyes Is there a sudden loss of vision in one or both eyes? Or double vision?  Face: Ask the person to smile. Does one side of the face droop or is it numb?  Arms: Ask the person to raise both arms. Does one arm drift downward? Is there weakness or numbness of a leg? Speech: Ask the person to repeat a simple phrase. Does the speech sound slurred/strange? Is the person confused ? Time: If you observe any of these signs, call 911.       Thank you for coming to see [place] at [facility]. I hope we have been able to provide you high quality care today.  You may receive a patient satisfaction survey over the next few weeks. We would appreciate your feedback and comments so that we may continue to improve ourselves and the health of our patients.

## 2022-02-07 ENCOUNTER — Ambulatory Visit: Payer: No Typology Code available for payment source | Admitting: Adult Health

## 2022-04-30 ENCOUNTER — Other Ambulatory Visit: Payer: Self-pay

## 2022-04-30 DIAGNOSIS — Z95828 Presence of other vascular implants and grafts: Secondary | ICD-10-CM

## 2022-04-30 DIAGNOSIS — I6523 Occlusion and stenosis of bilateral carotid arteries: Secondary | ICD-10-CM

## 2022-04-30 DIAGNOSIS — I70219 Atherosclerosis of native arteries of extremities with intermittent claudication, unspecified extremity: Secondary | ICD-10-CM

## 2022-05-16 ENCOUNTER — Ambulatory Visit (HOSPITAL_COMMUNITY)
Admission: RE | Admit: 2022-05-16 | Discharge: 2022-05-16 | Disposition: A | Discharge status: Home / Self Care | Payer: No Typology Code available for payment source | Source: Ambulatory Visit | Attending: Vascular Surgery | Admitting: Vascular Surgery

## 2022-05-16 ENCOUNTER — Ambulatory Visit (INDEPENDENT_AMBULATORY_CARE_PROVIDER_SITE_OTHER): Payer: No Typology Code available for payment source | Admitting: Physician Assistant

## 2022-05-16 ENCOUNTER — Encounter: Payer: Self-pay | Admitting: Physician Assistant

## 2022-05-16 ENCOUNTER — Ambulatory Visit (INDEPENDENT_AMBULATORY_CARE_PROVIDER_SITE_OTHER)
Admission: RE | Admit: 2022-05-16 | Discharge: 2022-05-16 | Disposition: A | Discharge status: Home / Self Care | Payer: No Typology Code available for payment source | Source: Ambulatory Visit | Attending: Vascular Surgery | Admitting: Vascular Surgery

## 2022-05-16 VITALS — BP 129/74 | HR 66 | Temp 97.8°F | Resp 18 | Ht 67.0 in | Wt 194.0 lb

## 2022-05-16 DIAGNOSIS — I6523 Occlusion and stenosis of bilateral carotid arteries: Secondary | ICD-10-CM

## 2022-05-16 DIAGNOSIS — I70219 Atherosclerosis of native arteries of extremities with intermittent claudication, unspecified extremity: Secondary | ICD-10-CM | POA: Diagnosis not present

## 2022-05-16 DIAGNOSIS — Z95828 Presence of other vascular implants and grafts: Secondary | ICD-10-CM | POA: Insufficient documentation

## 2022-05-16 LAB — VAS US ABI WITH/WO TBI
Left ABI: 0.63
Right ABI: 0.63

## 2022-05-16 NOTE — Progress Notes (Signed)
Office Note     CC:  follow up Requesting Provider:  No ref. provider found  HPI: Alec Sanchez is a 67 y.o. ([birth year]) male who presents for routine follow up of carotid artery stenosis and PAD. He is s/p right TCAR on 04/12/20 by Dr. Donzetta Matters for asymptomatic high grade ICA stenosis. Left ICA stenosis is known to be 40-59%.    He presents with his daughter today. He has had no new neurological symptoms since his last visit in February of 2023. He does have history of CVA in 2020 with residual right arm and hand weakness and slurred speech but this has improved significantly. He completed his PT and Speech therapy. He denies any visual changes, new speech difficulties, unilateral upper or lower extremity weakness or numbness. He is compliant with his Aspirin, statin and Plavix.    Patient reports continued claudication symptoms in RLE. Denies any LLE pain.  Says he can go about 10 minutes before he has to stop and rest. Pain will go away immediately and then will return on continued ambulation. He says this rarely interferes with is day to day activities. He denies any pain at rest or non healing wounds.   The pt is on a statin for cholesterol management.  The pt is on a daily aspirin. Other AC:  Plavix The pt is on ACE, CCB, Hydralazine for hypertension.   The pt is not diabetic.   Tobacco hx:  never  Past Medical History:  Diagnosis Date   Anxiety    Carotid artery occlusion    Depression    Hyperlipidemia    Hypertension    Stroke [facility])     Past Surgical History:  Procedure Laterality Date   ANKLE SURGERY Left    TRANSCAROTID ARTERY REVASCULARIZATION  Right 04/12/2020   Procedure: RIGHT TRANSCAROTID ARTERY REVASCULARIZATION;  Surgeon: Waynetta Sandy, MD;  Location: [facility] OR;  Service: Vascular;  Laterality: Right;   ULTRASOUND GUIDANCE FOR VASCULAR ACCESS Left 04/12/2020   Procedure: ULTRASOUND GUIDANCE FOR VASCULAR ACCESS, left femoral vein;  Surgeon: Waynetta Sandy, MD;  Location: [facility] OR;  Service: Vascular;  Laterality: Left;    Social History   Socioeconomic History   Marital status: Widowed    Spouse name: Not on file   Number of children: Not on file   Years of education: Not on file   Highest education level: Not on file  Occupational History   Not on file  Tobacco Use   Smoking status: Never   Smokeless tobacco: Never  Vaping Use   Vaping Use: Never used  Substance and Sexual Activity   Alcohol use: Not Currently   Drug use: Never   Sexual activity: Not on file  Other Topics Concern   Not on file  Social History Narrative   Not on file   Social Determinants of Health   Financial Resource Strain: Not on file  Food Insecurity: Not on file  Transportation Needs: Not on file  Physical Activity: Not on file  Stress: Not on file  Social Connections: Not on file  Intimate Partner Violence: Not on file    Family History  Problem Relation Age of Onset   Heart failure Mother    Stroke Mother    Stroke Father    Hypertension Father    Diabetes Father    GER disease Cousin     Current Outpatient Medications  Medication Sig Dispense Refill   acetaminophen (TYLENOL) 325 MG tablet Take 2 tablets (650 mg  total) by mouth every 6 (six) hours as needed for mild pain (or Fever >/= 101).     amLODipine (NORVASC) 10 MG tablet Take 1 tablet (10 mg total) by mouth every morning. 30 tablet 0   Cholecalciferol 25 MCG (1000 UT) tablet Take 1 tablet (1,000 Units total) by mouth every morning. 30 tablet 0   cloNIDine (CATAPRES) 0.1 MG tablet Take 2 tablets (0.2 mg total) by mouth 2 (two) times daily. 60 tablet 0   clopidogrel (PLAVIX) 75 MG tablet Take 75 mg by mouth daily.     diclofenac Sodium (VOLTAREN) 1 % GEL Apply 2 g topically 4 (four) times daily. 2 g 3   docusate sodium (COLACE) 100 MG capsule Take 1 capsule (100 mg total) by mouth daily. 10 capsule 0   ferrous sulfate 325 (65 FE) MG tablet Take 1 tablet (325 mg total) by  mouth every Monday, Wednesday, and Friday. 30 tablet 0   hydrALAZINE (APRESOLINE) 10 MG tablet Take 1 tablet (10 mg total) by mouth every 8 (eight) hours. 90 tablet 0   lidocaine (LIDODERM) 5 % Place 1 patch onto the skin daily. Remove & Discard patch within 12 hours or as directed by MD 30 patch 0   lisinopril (ZESTRIL) 40 MG tablet Take 1 tablet by mouth every morning.     methocarbamol (ROBAXIN) 500 MG tablet TAKE ONE TABLET BY MOUTH EVERY 8 HOURS AS NEEDED FOR MUSCLE SPASMA/MUSCLE CRAMPS     pantoprazole (PROTONIX) 40 MG tablet Take 1 tablet (40 mg total) by mouth daily. 30 tablet 0   PARoxetine (PAXIL) 40 MG tablet TAKE ONE-HALF TABLET BY MOUTH DAILY FOR MENTAL HEALTH     rosuvastatin (CRESTOR) 40 MG tablet Take 1 tablet (40 mg total) by mouth daily. 30 tablet 5   vitamin B-12 (CYANOCOBALAMIN) 1000 MCG tablet Take 1 tablet (1,000 mcg total) by mouth daily. 30 tablet 11   No current facility-administered medications for this visit.    Allergies  Allergen Reactions   Trazodone Nausea And Vomiting     REVIEW OF SYSTEMS:  [X]  denotes positive finding, [ ]  denotes negative finding Cardiac  Comments:  Chest pain or chest pressure:    Shortness of breath upon exertion:    Short of breath when lying flat:    Irregular heart rhythm:        Vascular    Pain in calf, thigh, or hip brought on by ambulation:    Pain in feet at night that wakes you up from your sleep:     Blood clot in your veins:    Leg swelling:         Pulmonary    Oxygen at home:    Productive cough:     Wheezing:         Neurologic    Sudden weakness in arms or legs:     Sudden numbness in arms or legs:     Sudden onset of difficulty speaking or slurred speech:    Temporary loss of vision in one eye:     Problems with dizziness:         Gastrointestinal    Blood in stool:     Vomited blood:         Genitourinary    Burning when urinating:     Blood in urine:        Psychiatric    Major depression:          Hematologic    Bleeding problems:  Problems with blood clotting too easily:        Skin    Rashes or ulcers:        Constitutional    Fever or chills:      PHYSICAL EXAMINATION:  Vitals:   05/16/22 1432 05/16/22 1433  BP: 132/72 129/74  Pulse: 66 66  Resp: 18   Temp: 97.8 F (36.6 C)   TempSrc: Temporal   SpO2: 99%   Weight: 194 lb (88 kg)   Height: 5\' 7"  (1.702 m)     General:  WDWN in NAD; vital signs documented above Gait: Normal  HENT: WNL, normocephalic Pulmonary: normal non-labored breathing , without Rales, rhonchi,  wheezing Cardiac: regular HR, without  Murmurs without carotid bruit Abdomen: soft, NT, no masses Vascular Exam/Pulses:  Right Left  Radial 2+ (normal) 2+ (normal)  Femoral 2+ (normal) 2+ (normal)  Popliteal absent absent  DP absent absent  PT absent absent  Doppler Dp/Pt signals bilaterally Extremities: without ischemic changes, without Gangrene , without cellulitis; without open wounds; moving his extremities without any deficits  Musculoskeletal: no muscle wasting or atrophy  Neurologic: A&O X 3;  No focal weakness or paresthesias are detected Psychiatric:  The pt has Normal affect.    Non-Invasive Vascular Imaging:   +-------+-----------+-----------+------------+------------+  ABI/TBIToday's ABIToday's TBIPrevious ABIPrevious TBI  +-------+-----------+-----------+------------+------------+  Right 0.63       0.37                                 +-------+-----------+-----------+------------+------------+  Left  0.63       0.49                                 +-------+-----------+-----------+------------+------------+   VAS US Carotid Duplex: Summary:  Right Carotid: Patent stent with no evidence for restenosis. . The ECA appears  >50% stenosed.   Left Carotid: Velocities in the left ICA are consistent with a 40-59% stenosis.   Vertebrals: Left vertebral artery demonstrates antegrade flow. Right vertebral  artery demonstrates an occlusion / near occlusion.  Subclavians: Normal flow hemodynamics were seen in bilateral subclavian arteries.   ASSESSMENT/PLAN:: 67 y.o. male here for follow up for carotid artery stenosis and PAD. He is s/p right TCAR on 04/12/20 by Dr. Donzetta Matters for asymptomatic high grade ICA stenosis. His duplex today shows patent right ICA stent. Left ICA with 40-59% stenosis, which is unchanged from prior studies. He has no new neurological deficits. He continues to have some residual speech difficulties and RUE/RLE weakness but overall it does not interfere with his daily activities. He continues to have claudication in his right leg. This is not lifestyle limiting. He has no tissue loss.  -ABI today shows moderate arterial disease bilaterally -Discussed importance of keeping his feet protected - Encourage exercise therapy/ walking regimen  - Continue Aspirin, Statin, Plavix - reviewed signs and symptoms of TIA/ Stroke and patient understands should this occur he should seek immediate medical attention - He will follow up in 1 year with carotid duplex and ABI  Karoline Caldwell, PA-C [facility] [phone]  Clinic MD:   Scot Dock

## 2022-07-21 IMAGING — CT CT ANGIO HEAD-NECK (W OR W/O PERF)
2 of 7 series · 8 of 33 positions shown · IV contrast (APPLIED)
Comparison: Head and neck CTA 01/31/2020

CLINICAL DATA: Neuro deficit, acute, stroke suspected. Slurred
speech.

EXAM:
CT ANGIOGRAPHY HEAD AND NECK
TECHNIQUE: Multidetector CT imaging of the head and neck was performed using
the standard protocol during bolus administration of intravenous
contrast. Multiplanar CT image reconstructions and MIPs were
obtained to evaluate the vascular anatomy. Carotid stenosis
measurements (when applicable) are obtained utilizing NASCET
criteria, using the distal internal carotid diameter as the
denominator.
CONTRAST:  100mL OMNIPAQUE IOHEXOL 350 MG/ML SOLN

[Series 6: cta neck/head · axial · 0.61mm/px · z∈[+1173,+1295]mm · 2 of 185 slices shown]
[im 62/185  soft-tissue]
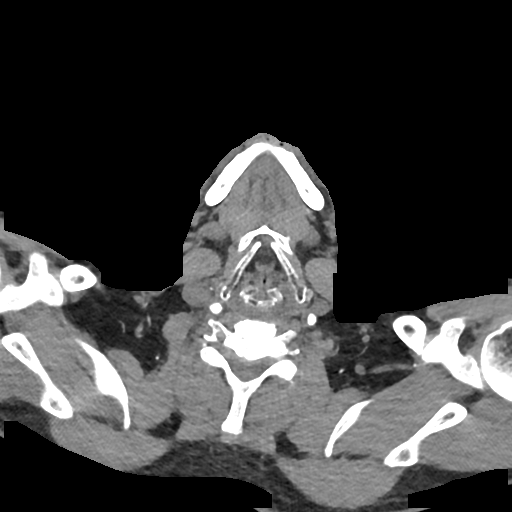
[im 123/185  soft-tissue]
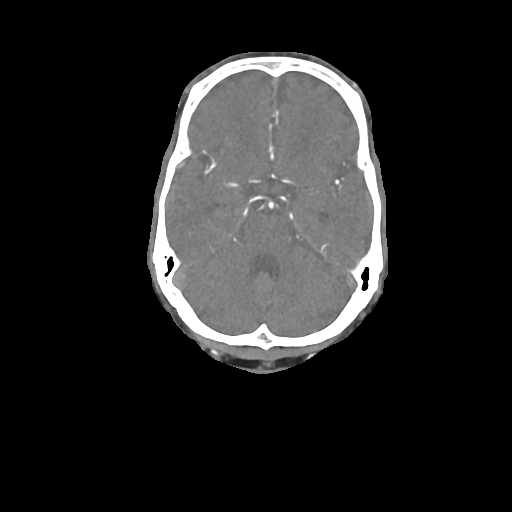

[Series 8: ax thins · axial · 0.39mm/px · z∈[+1103,+1366]mm · 6 of 369 slices shown]
[im 53/369  soft-tissue]
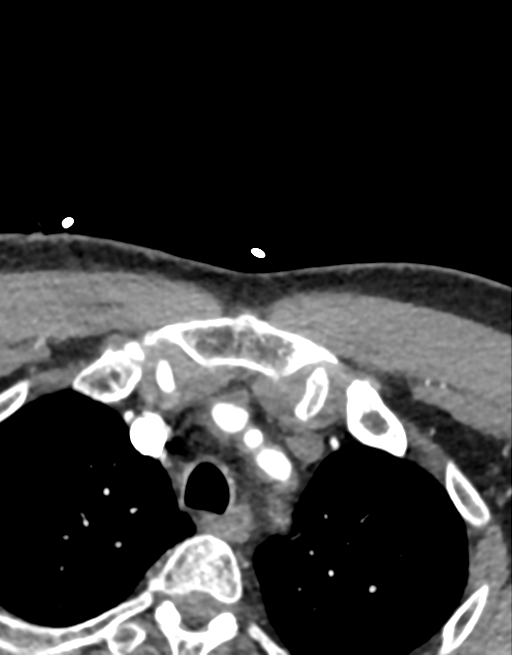
[im 106/369  bone]
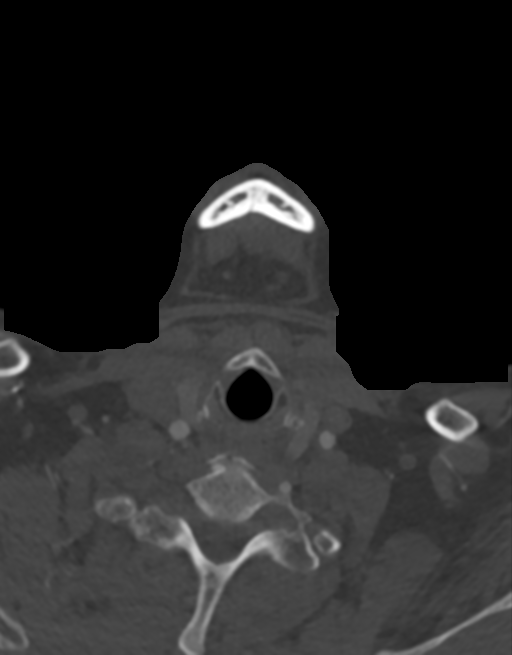
[im 158/369  soft-tissue]
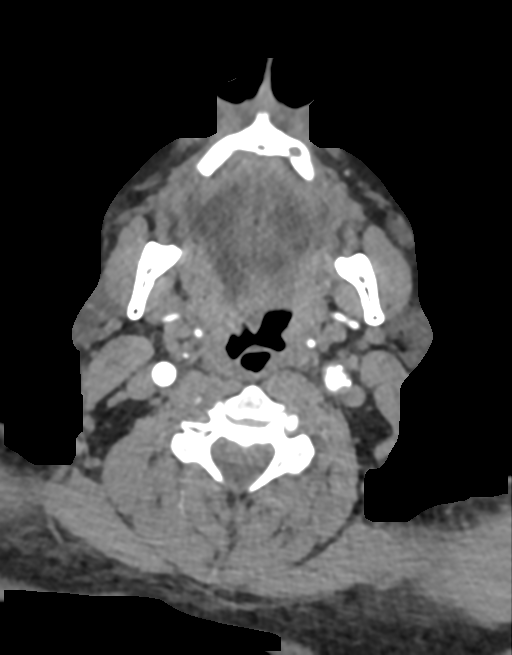
[im 211/369  bone]
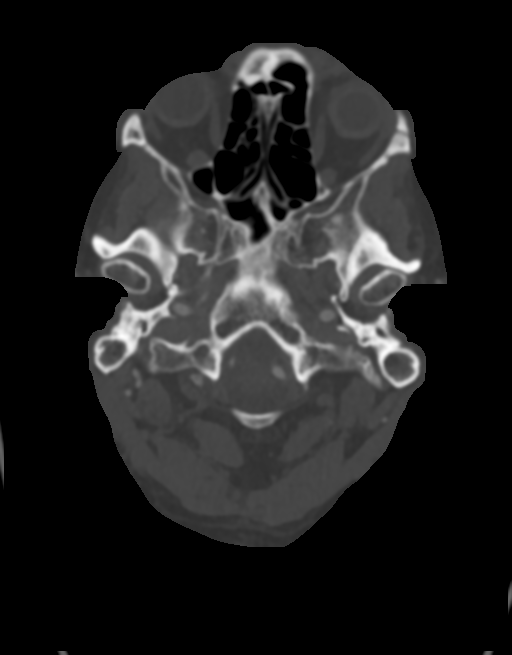
[im 263/369  soft-tissue]
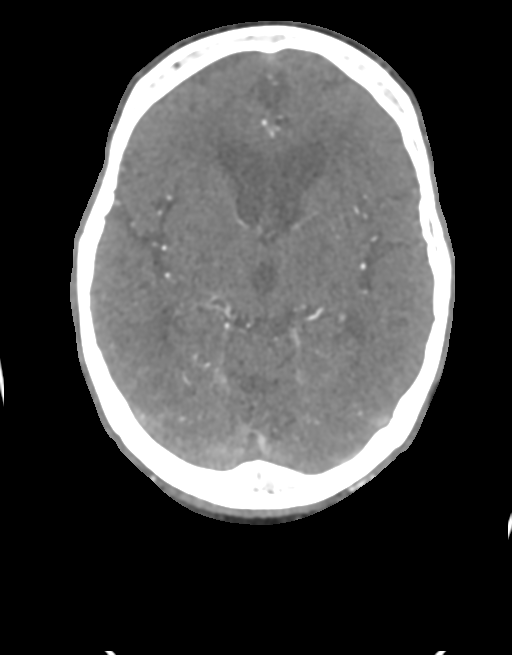
[im 316/369  bone]
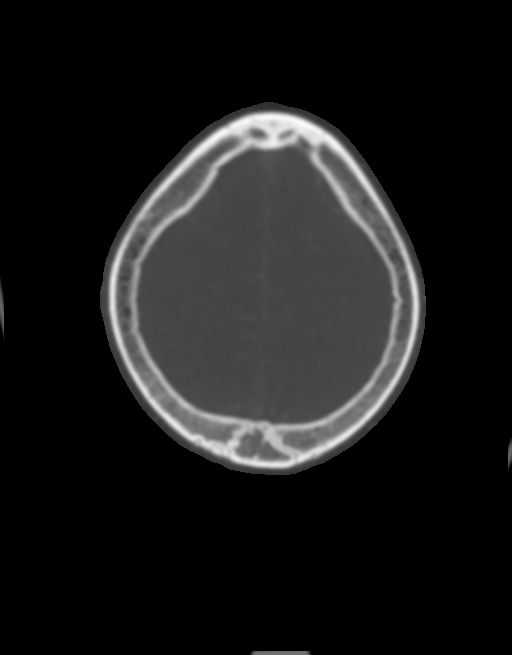

[8 of 33 positions shown; findings below may reference images not displayed]

FINDINGS: CTA NECK FINDINGS

Aortic arch: Standard 3 vessel aortic arch with mixed calcified and
soft plaque and with detailed evaluation of the aorta performed on
separate chest CTA. Patent brachiocephalic and subclavian arteries
without significant stenosis.

Right carotid system: Patent without evidence of a significant
stenosis or dissection. Interval carotid stenting extending from the
distal common into the proximal internal carotid arteries.

Left carotid system: Patent with soft plaque in the mid common
carotid artery not resulting in significant stenosis. Mixed
calcified and soft, mildly ulcerated plaque in the left carotid bulb
results in approximately 60% stenosis, unchanged.

Vertebral arteries: The left vertebral artery is patent and dominant
with predominantly soft plaque in the proximal V1 segment resulting
in progressive, severe stenosis (series 10, image 106). There is
unchanged occlusion of the right vertebral artery at its origin with
reconstitution of small distal V2 and V3 segments. A severe stenosis
is again noted of the distal right V3 segment.

Skeleton: Focally advanced cervical disc degeneration at C5-6.
Moderate left facet arthrosis at C4-5.

Other neck: No evidence of cervical lymphadenopathy or mass.

Upper chest: Reported separately.

Review of the MIP images confirms the above findings

CTA HEAD FINDINGS

Anterior circulation: The internal carotid arteries are patent from
skull base to carotid termini with similar appearance of mild left
cavernous and mild to moderate bilateral paraclinoid stenoses. ACAs
and MCAs are patent without evidence of a proximal branch occlusion
or significant proximal stenosis. There is an unchanged mild left M1
stenosis. Mild-to-moderate branch vessel irregularity is noted
bilaterally. No aneurysm is identified.

Posterior circulation: The intracranial vertebral arteries are
patent to the basilar. The left V4 segment is mildly irregular
without significant stenosis. The right V4 segment is small with
superimposed moderate to severe stenoses. Patent PICA, AICA, and SCA
origins are identified bilaterally. The basilar artery is widely
patent. Posterior communicating arteries are diminutive or absent.
The PCAs are patent with moderate branch vessel irregularity but no
flow limiting proximal stenosis. No aneurysm is identified.

Venous sinuses: Not well evaluated due to arterial contrast timing.

Anatomic variants: Dominant left vertebral artery.

Review of the MIP images confirms the above findings
IMPRESSION: 1. No emergent large vessel occlusion.
2. Interval right carotid stenting without residual stenosis.
3. Unchanged 60% proximal left ICA stenosis.
4. Unchanged occlusion of the right vertebral artery at its origin
with distal reconstitution.
5. Progressive, severe proximal left vertebral artery stenosis.
6. Unchanged mild to moderate bilateral intracranial ICA stenoses
and mild left M1 stenosis.
7. Aortic Atherosclerosis ([id number]).

## 2022-07-21 IMAGING — MR MR HEAD W/O CM
12 of 17 series · 34 of 48 positions shown · non-contrast
Comparison: Prior CTA from earlier the same day.

CLINICAL DATA: Follow-up examination for acute stroke.

EXAM:
MRI HEAD WITHOUT CONTRAST
TECHNIQUE: Multiplanar, multiecho pulse sequences of the brain and surrounding
structures were obtained without intravenous contrast.

[Series 5: DWI · axial · 3.0mm · 0.88mm/px · z∈[-111,+38]mm · 5 of 102 slices shown (1 of 4)]
[im 1/102]
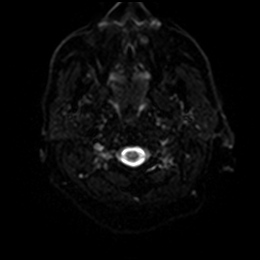
[im 26/102]
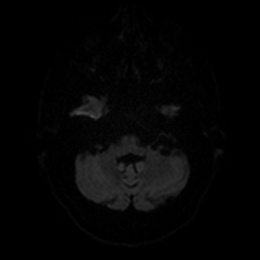
[im 51/102]
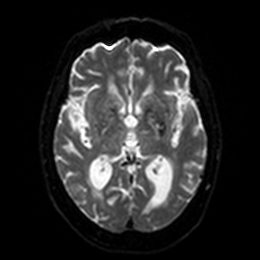
[im 76/102]
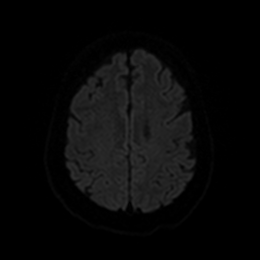
[im 102/102]
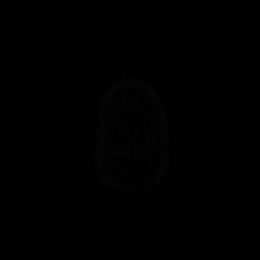

[Series 6: DWI · axial · 3.0mm · 0.88mm/px · z∈[-111,+38]mm · 2 of 51 slices shown (2 of 4)]
[im 1/51]
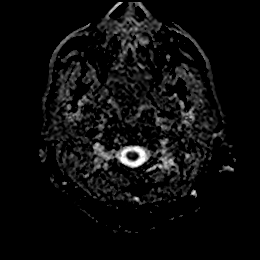
[im 51/51]
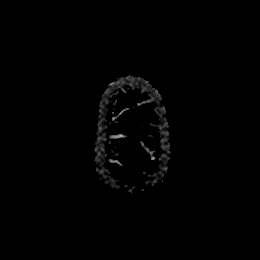

[Series 7: DWI · coronal · 4.0mm · 0.88mm/px · 3 of 70 slices shown (3 of 4)]
[im 1/70]
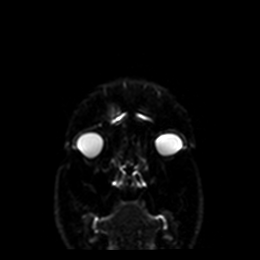
[im 35/70]
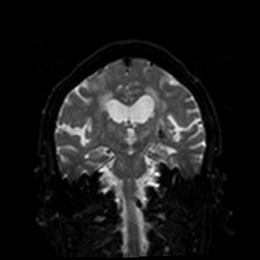
[im 70/70]
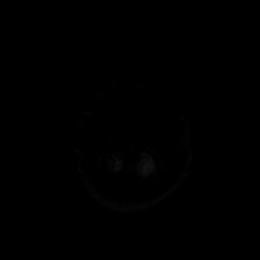

[Series 8: DWI · coronal · 4.0mm · 0.88mm/px · 1 of 35 slices shown (4 of 4)]
[im 1/35]
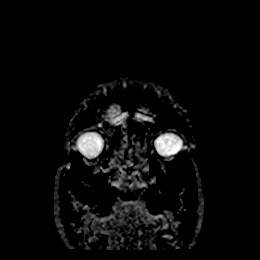

[Series 9: T1 · sagittal · 5.0mm · 0.75mm/px · 2 of 25 slices shown]
[im 1/25]
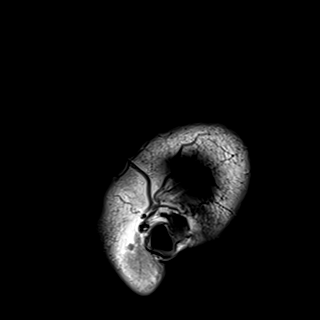
[im 25/25]
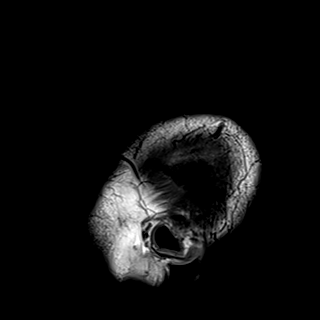

[Series 10: T2 · axial · 5.0mm · 0.72mm/px · z∈[-104,+39]mm · 2 of 25 slices shown (1 of 2)]
[im 1/25]
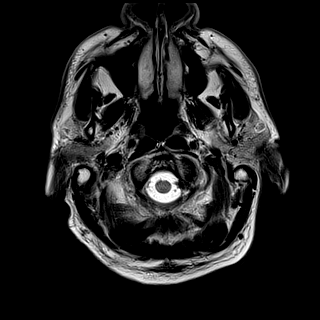
[im 25/25]
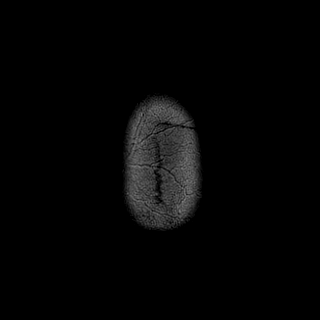

[Series 11: FLAIR · axial · 5.0mm · 0.45mm/px · z∈[-104,+38]mm · 2 of 25 slices shown]
[im 1/25]
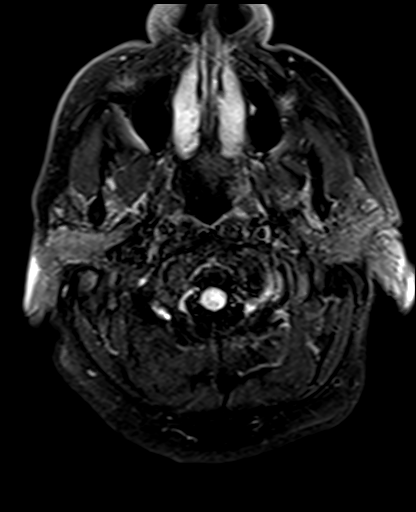
[im 25/25]
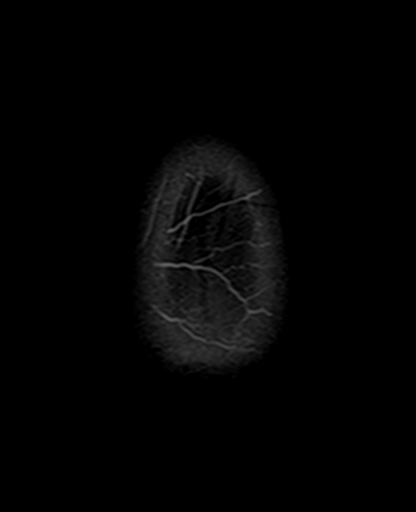

[Series 12: mag_images · axial · 3.0mm · 0.90mm/px · z∈[-126,+49]mm · 4 of 60 slices shown]
[im 1/60]
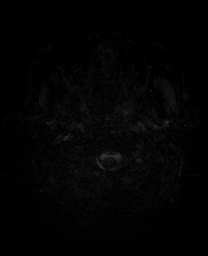
[im 20/60]
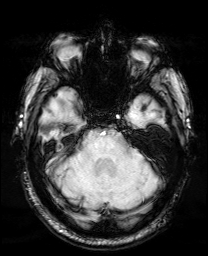
[im 40/60]
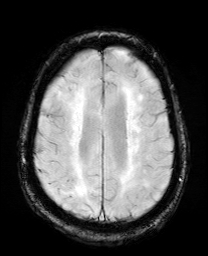
[im 60/60]
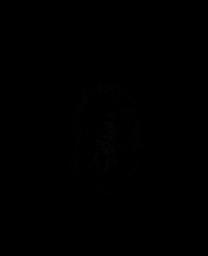

[Series 13: pha_images · axial · 3.0mm · 0.90mm/px · z∈[-126,+49]mm · 4 of 59 slices shown]
[im 1/59]
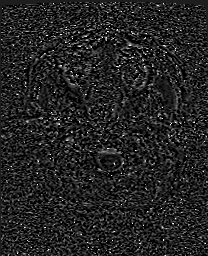
[im 20/59]
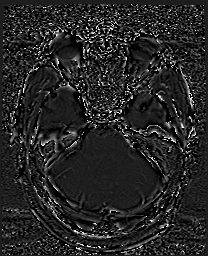
[im 39/59]
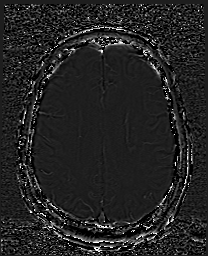
[im 59/59]
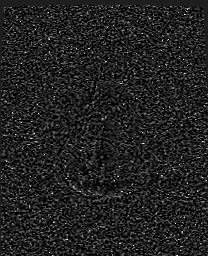

[Series 14: swi_images · axial · 3.0mm · 0.90mm/px · z∈[-126,+49]mm · 4 of 60 slices shown]
[im 1/60]
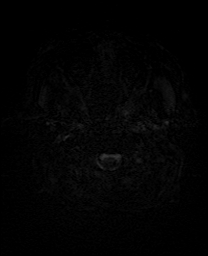
[im 20/60]
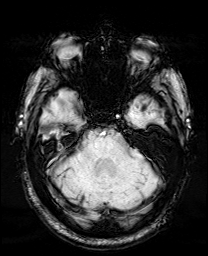
[im 40/60]
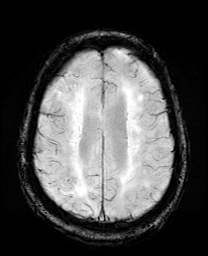
[im 60/60]
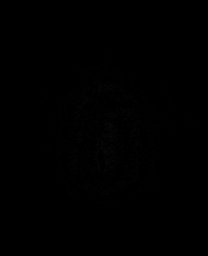

[Series 15: mip_images(sw) · axial · 24.0mm · 0.90mm/px · z∈[-116,+39]mm · 3 of 53 slices shown]
[im 1/53]
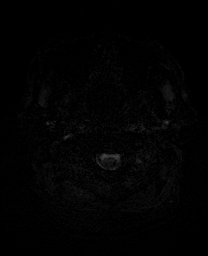
[im 27/53]
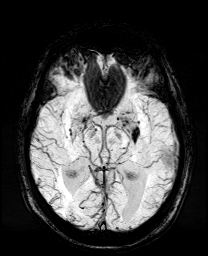
[im 53/53]
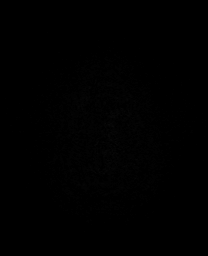

[Series 17: T2 · coronal · 5.0mm · 0.34mm/px · 2 of 32 slices shown (2 of 2)]
[im 1/32]
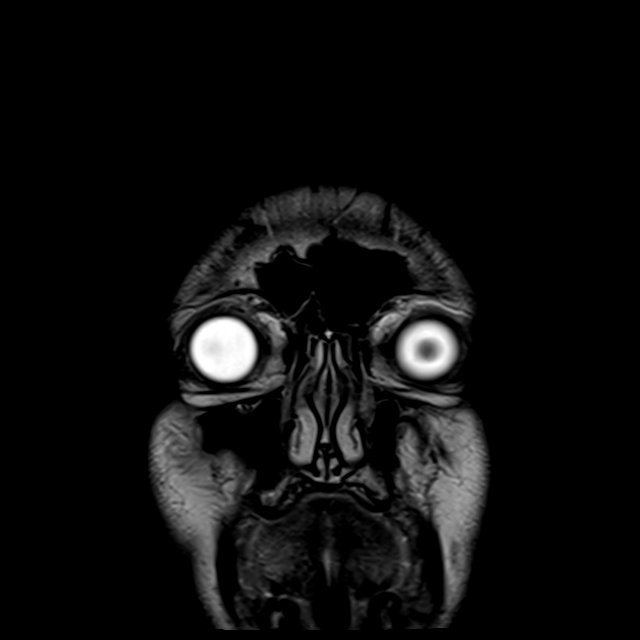
[im 32/32]
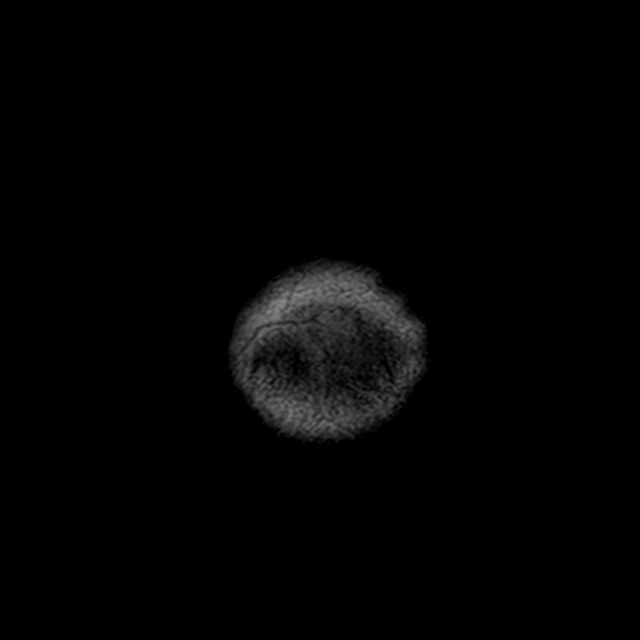

[34 of 48 positions shown; findings below may reference images not displayed]

FINDINGS: Brain: Diffuse prominence of the CSF containing spaces compatible
with generalized cerebral atrophy. Patchy and confluent T2/FLAIR
hyperintensity involving the periventricular and deep white matter
both cerebral hemispheres most consistent with chronic small vessel
ischemic disease, moderate to advanced in nature. Remote lacunar
infarcts present at the left greater than right basal ganglia and
thalami. Appearance is progressed from prior.

Three distinct foci of restricted diffusion all measuring
approximately 8 mm each seen involving the right anterior genu of
the corpus callosum (series 5, image 87), periventricular white
matter of the right posterior corona radiata (series 5, image 84),
and right lentiform nucleus (series 5, image 78). No associated
hemorrhage or mass effect. No other diffusion abnormality to suggest
acute or subacute ischemia.

Additionally, there is patchy T2/FLAIR signal abnormality involving
primarily the subcortical aspect of the posterior right
frontoparietal region (series 11, image 20), with extension to
involve the parasagittal right frontal region near the vertex
(series 11, image 22). No associated diffusion abnormality or
significant mass effect. Finding is nonspecific, but new as compared
to prior MRI, and could reflect changes subacute ischemia, somewhat
watershed in distribution, possibly related to previously identified
severe right ICA stenosis. Possible PRES could also conceivably have
this appearance.

No evidence for acute intracranial hemorrhage. Multiple scattered
punctate chronic micro hemorrhages noted, primarily clustered about
the deep gray nuclei, likely related to chronic poorly controlled
hypertension.

No mass lesion, midline shift or mass effect. Mild ventricular
prominence related global parenchymal volume loss of hydrocephalus.
No extra-axial fluid collection. Pituitary gland suprasellar region
normal. Midline structures intact.

Vascular: Major intracranial vascular flow voids are maintained.

Skull and upper cervical spine: Craniocervical junction within
normal limits. Bone marrow signal intensity within normal limits. No
scalp soft tissue abnormality.

Sinuses/Orbits: Globes orbital soft tissues within normal limits.
Paranasal sinuses are largely clear. No significant mastoid
effusion. Inner ear structures grossly normal.

Other: None.
IMPRESSION: 1. Three distinct 8 mm acute ischemic infarcts involving the right
anterior genu of the corpus callosum, periventricular white matter
of the right posterior corona radiata, and right lentiform nucleus
as above. No associated hemorrhage or mass effect.
2. Scattered T2/FLAIR signal abnormality involving the subcortical
aspects of the right frontal and parietal regions as above, new as
compared to prior MRI from 01/31/2020. Finding is nonspecific, but
favored to reflect changes of subacute ischemia, possibly related to
previously identified severe right ICA stenosis given the somewhat
watershed distribution. Possible PRES could also conceivably have
this appearance, and could be considered in the correct clinical
setting.
3. Underlying age-related cerebral atrophy with advanced chronic
microvascular ischemic disease, progressed from prior.

## 2023-07-24 ENCOUNTER — Emergency Department (HOSPITAL_COMMUNITY)

## 2023-07-24 ENCOUNTER — Encounter (HOSPITAL_COMMUNITY): Payer: Self-pay

## 2023-07-24 ENCOUNTER — Emergency Department (HOSPITAL_COMMUNITY)
Admission: EM | Admit: 2023-07-24 | Discharge: 2023-07-25 | Disposition: A | Discharge status: Home / Self Care | Attending: Emergency Medicine | Admitting: Emergency Medicine

## 2023-07-24 ENCOUNTER — Other Ambulatory Visit: Payer: Self-pay

## 2023-07-24 DIAGNOSIS — R109 Unspecified abdominal pain: Secondary | ICD-10-CM | POA: Insufficient documentation

## 2023-07-24 DIAGNOSIS — R079 Chest pain, unspecified: Secondary | ICD-10-CM | POA: Diagnosis not present

## 2023-07-24 DIAGNOSIS — Z79899 Other long term (current) drug therapy: Secondary | ICD-10-CM | POA: Diagnosis not present

## 2023-07-24 DIAGNOSIS — M549 Dorsalgia, unspecified: Secondary | ICD-10-CM | POA: Diagnosis not present

## 2023-07-24 DIAGNOSIS — M542 Cervicalgia: Secondary | ICD-10-CM | POA: Insufficient documentation

## 2023-07-24 DIAGNOSIS — I10 Essential (primary) hypertension: Secondary | ICD-10-CM | POA: Diagnosis not present

## 2023-07-24 DIAGNOSIS — S0990XA Unspecified injury of head, initial encounter: Secondary | ICD-10-CM | POA: Insufficient documentation

## 2023-07-24 DIAGNOSIS — Y92410 Unspecified street and highway as the place of occurrence of the external cause: Secondary | ICD-10-CM | POA: Insufficient documentation

## 2023-07-24 LAB — I-STAT CHEM 8, ED
BUN: 9 mg/dL (ref 8–23)
Calcium, Ion: 1.15 mmol/L (ref 1.15–1.40)
Chloride: 105 mmol/L (ref 98–111)
Creatinine, Ser: 1.6 mg/dL — ABNORMAL HIGH (ref 0.61–1.24)
Glucose, Bld: 92 mg/dL (ref 70–99)
HCT: 42 % (ref 39.0–52.0)
Hemoglobin: 14.3 g/dL (ref 13.0–17.0)
Potassium: 3.7 mmol/L (ref 3.5–5.1)
Sodium: 142 mmol/L (ref 135–145)
TCO2: 27 mmol/L (ref 22–32)

## 2023-07-24 MED ORDER — IOHEXOL 350 MG/ML SOLN
75.0000 mL | Freq: Once | INTRAVENOUS | Status: AC | PRN
  Administered 2023-07-24: 75 mL via INTRAVENOUS

## 2023-07-24 MED ORDER — ACETAMINOPHEN 500 MG PO TABS
1000.0000 mg | ORAL_TABLET | Freq: Once | ORAL | Status: AC
  Administered 2023-07-24: 1000 mg via ORAL
  Filled 2023-07-24: qty 2

## 2023-07-24 NOTE — ED Notes (Signed)
 Pt repositioned. Family remains at bedside. Denies needing anything for pain
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
## 2023-07-24 NOTE — ED Notes (Signed)
 Patient transported to MRI
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
## 2023-07-24 NOTE — ED Notes (Signed)
 Patient transported to CT
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
## 2023-07-25 NOTE — ED Notes (Signed)
 AVS provided by edp was reviewed with pt. Pt verbalized understanding with no additional questions at this time. EDP Bero verified disposition following BP. Pt going home with son in law at bedside. Requests taking home c-collar.

## 2023-07-25 NOTE — Discharge Instructions (Addendum)
 You were evaluated in the Emergency Department and after careful evaluation, we did not find any emergent condition requiring admission or further testing in the hospital.  Your exam/testing today is overall reassuring.  MRI did not show any significant injuries.  Recommend rest, Tylenol  at home as needed for pain.  Please return to the Emergency Department if you experience any worsening of your condition.   Thank you for allowing us  to be a part of your care.

## 2023-09-22 ENCOUNTER — Other Ambulatory Visit: Payer: Self-pay

## 2023-09-22 DIAGNOSIS — I6523 Occlusion and stenosis of bilateral carotid arteries: Secondary | ICD-10-CM

## 2023-09-22 DIAGNOSIS — I70219 Atherosclerosis of native arteries of extremities with intermittent claudication, unspecified extremity: Secondary | ICD-10-CM

## 2023-10-15 ENCOUNTER — Ambulatory Visit (HOSPITAL_COMMUNITY)
Admission: RE | Admit: 2023-10-15 | Discharge: 2023-10-15 | Disposition: A | Discharge status: Home / Self Care | Source: Ambulatory Visit | Attending: Vascular Surgery | Admitting: Vascular Surgery

## 2023-10-15 ENCOUNTER — Ambulatory Visit: Attending: Vascular Surgery | Admitting: Vascular Surgery

## 2023-10-15 ENCOUNTER — Ambulatory Visit (HOSPITAL_BASED_OUTPATIENT_CLINIC_OR_DEPARTMENT_OTHER)
Admission: RE | Admit: 2023-10-15 | Discharge: 2023-10-15 | Disposition: A | Discharge status: Home / Self Care | Source: Ambulatory Visit | Attending: Vascular Surgery | Admitting: Vascular Surgery

## 2023-10-15 ENCOUNTER — Encounter: Payer: Self-pay | Admitting: Vascular Surgery

## 2023-10-15 VITALS — BP 142/80 | HR 61 | Temp 98.4°F | Resp 20 | Ht 67.0 in | Wt 190.9 lb

## 2023-10-15 DIAGNOSIS — I70219 Atherosclerosis of native arteries of extremities with intermittent claudication, unspecified extremity: Secondary | ICD-10-CM | POA: Insufficient documentation

## 2023-10-15 DIAGNOSIS — E785 Hyperlipidemia, unspecified: Secondary | ICD-10-CM | POA: Insufficient documentation

## 2023-10-15 DIAGNOSIS — I6523 Occlusion and stenosis of bilateral carotid arteries: Secondary | ICD-10-CM | POA: Insufficient documentation

## 2023-10-15 DIAGNOSIS — I10 Essential (primary) hypertension: Secondary | ICD-10-CM | POA: Insufficient documentation

## 2023-10-15 DIAGNOSIS — Z8673 Personal history of transient ischemic attack (TIA), and cerebral infarction without residual deficits: Secondary | ICD-10-CM | POA: Insufficient documentation

## 2023-10-15 LAB — VAS US ABI WITH/WO TBI
Left ABI: 0.71
Right ABI: 0.6

## 2023-10-15 NOTE — Progress Notes (Signed)
 Patient ID: Alec Sanchez, male   DOB: 11-10-1955, 68 y.o.   MRN: [id number]  Reason for Consult: Follow-up   Referred by Clinic, [facility] Lien  Subjective:     HPI:  Alec Sanchez is a 68 y.o. male has a history of right TCAR over 3 years ago for asymptomatic high-grade stenosis.  He has recovered very well from surgery.  Does have a remote history of stroke.  He states that he does continue to smoke about half pack per day.  He has pain in his leg after very short distance walking right greater than left.  Denies any tissue loss or ulceration in the legs.  He does not have rest pain.  Past Medical History:  Diagnosis Date   Anxiety    Carotid artery occlusion    Depression    Hyperlipidemia    Hypertension    Stroke [facility])    Family History  Problem Relation Age of Onset   Heart failure Mother    Stroke Mother    Stroke Father    Hypertension Father    Diabetes Father    GER disease Cousin    Past Surgical History:  Procedure Laterality Date   ANKLE SURGERY Left    TRANSCAROTID ARTERY REVASCULARIZATION  Right 04/12/2020   Procedure: RIGHT TRANSCAROTID ARTERY REVASCULARIZATION;  Surgeon: Sheree Penne Bruckner, MD;  Location: [facility] OR;  Service: Vascular;  Laterality: Right;   ULTRASOUND GUIDANCE FOR VASCULAR ACCESS Left 04/12/2020   Procedure: ULTRASOUND GUIDANCE FOR VASCULAR ACCESS, left femoral vein;  Surgeon: Sheree Penne Bruckner, MD;  Location: [facility] OR;  Service: Vascular;  Laterality: Left;    Short Social History:  Social History   Tobacco Use   Smoking status: Every Day    Types: Cigarettes   Smokeless tobacco: Never  Substance Use Topics   Alcohol use: Not Currently    Allergies  Allergen Reactions   Trazodone Nausea And Vomiting    Current Outpatient Medications  Medication Sig Dispense Refill   acetaminophen  (TYLENOL ) 325 MG tablet Take 2 tablets (650 mg total) by mouth every 6 (six) hours as needed for mild pain (or Fever >/= 101).      amLODipine  (NORVASC ) 10 MG tablet Take 1 tablet (10 mg total) by mouth every morning. 30 tablet 0   Cholecalciferol  25 MCG (1000 UT) tablet Take 1 tablet (1,000 Units total) by mouth every morning. 30 tablet 0   cloNIDine  (CATAPRES ) 0.1 MG tablet Take 2 tablets (0.2 mg total) by mouth 2 (two) times daily. 60 tablet 0   clopidogrel  (PLAVIX ) 75 MG tablet Take 75 mg by mouth daily.     diclofenac  Sodium (VOLTAREN ) 1 % GEL Apply 2 g topically 4 (four) times daily. 2 g 3   docusate sodium  (COLACE) 100 MG capsule Take 1 capsule (100 mg total) by mouth daily. 10 capsule 0   ferrous sulfate  325 (65 FE) MG tablet Take 1 tablet (325 mg total) by mouth every Monday, Wednesday, and Friday. 30 tablet 0   hydrALAZINE  (APRESOLINE ) 10 MG tablet Take 1 tablet (10 mg total) by mouth every 8 (eight) hours. 90 tablet 0   lidocaine  (LIDODERM ) 5 % Place 1 patch onto the skin daily. Remove & Discard patch within 12 hours or as directed by MD 30 patch 0   lisinopril  (ZESTRIL ) 40 MG tablet Take 1 tablet by mouth every morning.     methocarbamol  (ROBAXIN ) 500 MG tablet TAKE ONE TABLET BY MOUTH EVERY 8 HOURS AS NEEDED FOR MUSCLE SPASMA/MUSCLE  CRAMPS     pantoprazole  (PROTONIX ) 40 MG tablet Take 1 tablet (40 mg total) by mouth daily. 30 tablet 0   PARoxetine  (PAXIL ) 40 MG tablet TAKE ONE-HALF TABLET BY MOUTH DAILY FOR MENTAL HEALTH     rosuvastatin  (CRESTOR ) 40 MG tablet Take 1 tablet (40 mg total) by mouth daily. 30 tablet 5   vitamin B-12 (CYANOCOBALAMIN) 1000 MCG tablet Take 1 tablet (1,000 mcg total) by mouth daily. 30 tablet 11   No current facility-administered medications for this visit.    Review of Systems  Constitutional:  Constitutional negative. HENT: HENT negative.  Eyes: Eyes negative.  Cardiovascular: Positive for claudication.  GI: Gastrointestinal negative.  Musculoskeletal: Positive for leg pain.  Skin: Skin negative.  Neurological: Neurological negative. Hematologic: Hematologic/lymphatic  negative.  Psychiatric: Psychiatric negative.        Objective:  Objective   Vitals:   10/15/23 1113 10/15/23 1115  BP: (!) 157/82 (!) 142/80  Pulse: 61   Resp: 20   Temp: 98.4 F (36.9 C)   TempSrc: Temporal   SpO2: 99%   Weight: 190 lb 14.4 oz (86.6 kg)   Height: 5' 7 (1.702 m)    Body mass index is 29.9 kg/m.  Physical Exam HENT:     Nose: Nose normal.     Mouth/Throat:     Mouth: Mucous membranes are moist.  Eyes:     Pupils: Pupils are equal, round, and reactive to light.  Cardiovascular:     Pulses:          Femoral pulses are 0 on the right side and 2+ on the left side. Musculoskeletal:     Right lower leg: No edema.     Left lower leg: No edema.  Skin:    Capillary Refill: Capillary refill takes more than 3 seconds.  Neurological:     General: No focal deficit present.     Mental Status: He is alert.  Psychiatric:        Mood and Affect: Mood normal.        Thought Content: Thought content normal.        Judgment: Judgment normal.     Data: ABI Findings:  +---------+------------------+-----+----------+--------+  Right   Rt Pressure (mmHg)IndexWaveform  Comment   +---------+------------------+-----+----------+--------+  Brachial 137                                        +---------+------------------+-----+----------+--------+  ATA     39                0.28 monophasic          +---------+------------------+-----+----------+--------+  PTA     82                0.60 monophasic          +---------+------------------+-----+----------+--------+  Great Toe36                0.26                     +---------+------------------+-----+----------+--------+   +---------+------------------+-----+----------+-------+  Left    Lt Pressure (mmHg)IndexWaveform  Comment  +---------+------------------+-----+----------+-------+  Brachial 135                                        +---------+------------------+-----+----------+-------+  ATA  absent             +---------+------------------+-----+----------+-------+  PTA     97                0.71 monophasic         +---------+------------------+-----+----------+-------+  Great Toe51                0.37                    +---------+------------------+-----+----------+-------+   +-------+-----------+-----------+------------+------------+  ABI/TBIToday's ABIToday's TBIPrevious ABIPrevious TBI  +-------+-----------+-----------+------------+------------+  Right 0.6        0.26       0.63        0.37          +-------+-----------+-----------+------------+------------+  Left  0.71       0.31       0.63        0.49          +-------+-----------+-----------+------------+------------+         Previous ABI on 05/16/22.    Summary:  Right: Resting right ankle-brachial index indicates moderate right lower  extremity arterial disease. The right toe-brachial index is abnormal.   Left: Resting left ankle-brachial index indicates moderate left lower  extremity arterial disease. The left toe-brachial index is abnormal.    Right Carotid Findings:  +----------+--------+--------+--------+------------------+--------+           PSV cm/sEDV cm/sStenosisPlaque DescriptionComments  +----------+--------+--------+--------+------------------+--------+  CCA Prox  118     16                                          +----------+--------+--------+--------+------------------+--------+  CCA Mid   106     18              heterogenous                +----------+--------+--------+--------+------------------+--------+  CCA Distal                                          stent     +----------+--------+--------+--------+------------------+--------+  ICA Prox                                            stent      +----------+--------+--------+--------+------------------+--------+  ICA Mid                                             stent     +----------+--------+--------+--------+------------------+--------+  ICA Distal57      19                                          +----------+--------+--------+--------+------------------+--------+  ECA      264     18      >50%                                +----------+--------+--------+--------+------------------+--------+   +----------+--------+-------+--------+-------------------+  PSV cm/sEDV cmsDescribeArm Pressure (mmHG)  +----------+--------+-------+--------+-------------------+  Subclavian101    0                                   +----------+--------+-------+--------+-------------------+   +---------+--------+--+--------+-+  VertebralPSV cm/s33EDV cm/s4  +---------+--------+--+--------+-+   Right Stent(s):  +---------------------+--------+--------+--------+--------+--------+  distal CCA to mid ICAPSV cm/sEDV cm/sStenosisWaveformComments  +---------------------+--------+--------+--------+--------+--------+  Prox to Stent        80      19                                +---------------------+--------+--------+--------+--------+--------+  Proximal Stent       86      19                                +---------------------+--------+--------+--------+--------+--------+  Mid Stent            72      19                                +---------------------+--------+--------+--------+--------+--------+  Distal Stent         70      24                                +---------------------+--------+--------+--------+--------+--------+  Distal to Stent      69      23                                +---------------------+--------+--------+--------+--------+--------+      Left Carotid Findings:   +----------+--------+--------+--------+-------------------------+--------+           PSV cm/sEDV cm/sStenosisPlaque Description       Comments  +----------+--------+--------+--------+-------------------------+--------+  CCA Prox  123     19                                                 +----------+--------+--------+--------+-------------------------+--------+  CCA Mid   120     26              heterogenous                       +----------+--------+--------+--------+-------------------------+--------+  CCA Distal80      15                                                 +----------+--------+--------+--------+-------------------------+--------+  ICA Prox  67      16      1-39%   heterogenous and calcific          +----------+--------+--------+--------+-------------------------+--------+  ICA Mid   77      24                                                 +----------+--------+--------+--------+-------------------------+--------+  ICA Distal56      17                                                 +----------+--------+--------+--------+-------------------------+--------+  ECA      98      12                                                 +----------+--------+--------+--------+-------------------------+--------+   +----------+--------+--------+--------+-------------------+           PSV cm/sEDV cm/sDescribeArm Pressure (mmHG)  +----------+--------+--------+--------+-------------------+  Subclavian173    0                                    +----------+--------+--------+--------+-------------------+   +---------+--------+--+--------+--+  VertebralPSV cm/s32EDV cm/s11  +---------+--------+--+--------+--+       Summary:  Right Carotid: Patent stent with no stenosis.   Left Carotid: Velocities in the left ICA are consistent with a 1-39%  stenosis.   Vertebrals: Bilateral vertebral arteries demonstrate  antegrade flow.  Subclavians: Normal flow hemodynamics were seen in bilateral subclavian               arteries.      Assessment/Plan:     68 year old male status post right TCAR for asymptomatic disease with patent stent and stable mild disease on the left.  ABIs are moderately decreased he continues to smoke 1/2 pack/day.  I will have him follow-up in 6 months with right lower extremity arterial duplex and ABIs as he does not have a common femoral pulse on the right that I can readily palpate today.  I have discussed the need for total smoking cessation he demonstrates understanding.    Penne Lonni Colorado MD [facility]

## 2023-10-16 ENCOUNTER — Other Ambulatory Visit: Payer: Self-pay

## 2023-10-16 DIAGNOSIS — I70219 Atherosclerosis of native arteries of extremities with intermittent claudication, unspecified extremity: Secondary | ICD-10-CM

## 2023-12-23 DIAGNOSIS — I252 Old myocardial infarction: Secondary | ICD-10-CM | POA: Diagnosis not present

## 2023-12-23 DIAGNOSIS — I10 Essential (primary) hypertension: Secondary | ICD-10-CM | POA: Diagnosis not present

## 2023-12-23 DIAGNOSIS — I69351 Hemiplegia and hemiparesis following cerebral infarction affecting right dominant side: Secondary | ICD-10-CM | POA: Diagnosis not present

## 2023-12-23 DIAGNOSIS — M62838 Other muscle spasm: Secondary | ICD-10-CM | POA: Diagnosis not present

## 2023-12-23 DIAGNOSIS — Z791 Long term (current) use of non-steroidal anti-inflammatories (NSAID): Secondary | ICD-10-CM | POA: Diagnosis not present

## 2023-12-23 DIAGNOSIS — F325 Major depressive disorder, single episode, in full remission: Secondary | ICD-10-CM | POA: Diagnosis not present

## 2023-12-23 DIAGNOSIS — M1990 Unspecified osteoarthritis, unspecified site: Secondary | ICD-10-CM | POA: Diagnosis not present

## 2023-12-23 DIAGNOSIS — K219 Gastro-esophageal reflux disease without esophagitis: Secondary | ICD-10-CM | POA: Diagnosis not present

## 2023-12-23 DIAGNOSIS — E785 Hyperlipidemia, unspecified: Secondary | ICD-10-CM | POA: Diagnosis not present

## 2024-04-14 ENCOUNTER — Encounter (HOSPITAL_COMMUNITY)

## 2024-04-14 ENCOUNTER — Ambulatory Visit
